# Patient Record
Sex: Female | Born: 1955
Health system: Southern US, Community
[De-identification: ages and names within clinical notes are randomized; demographics above are authoritative.]

## PROBLEM LIST (undated history)

## (undated) DIAGNOSIS — R011 Cardiac murmur, unspecified: Secondary | ICD-10-CM

## (undated) DIAGNOSIS — I10 Essential (primary) hypertension: Secondary | ICD-10-CM

## (undated) DIAGNOSIS — L570 Actinic keratosis: Secondary | ICD-10-CM

## (undated) DIAGNOSIS — T7840XA Allergy, unspecified, initial encounter: Secondary | ICD-10-CM

## (undated) DIAGNOSIS — L309 Dermatitis, unspecified: Secondary | ICD-10-CM

## (undated) DIAGNOSIS — B019 Varicella without complication: Secondary | ICD-10-CM

## (undated) DIAGNOSIS — E785 Hyperlipidemia, unspecified: Secondary | ICD-10-CM

## (undated) DIAGNOSIS — M1990 Unspecified osteoarthritis, unspecified site: Secondary | ICD-10-CM

## (undated) DIAGNOSIS — N1832 Chronic kidney disease, stage 3b: Secondary | ICD-10-CM

## (undated) DIAGNOSIS — K219 Gastro-esophageal reflux disease without esophagitis: Secondary | ICD-10-CM

## (undated) DIAGNOSIS — K635 Polyp of colon: Secondary | ICD-10-CM

## (undated) DIAGNOSIS — N3941 Urge incontinence: Secondary | ICD-10-CM

## (undated) DIAGNOSIS — D509 Iron deficiency anemia, unspecified: Secondary | ICD-10-CM

## (undated) DIAGNOSIS — U071 COVID-19: Secondary | ICD-10-CM

## (undated) DIAGNOSIS — E669 Obesity, unspecified: Secondary | ICD-10-CM

## (undated) HISTORY — PX: BUNIONECTOMY: SHX129

## (undated) HISTORY — PX: TONSILLECTOMY AND ADENOIDECTOMY: SUR1326

## (undated) HISTORY — DX: Allergy, unspecified, initial encounter: T78.40XA

## (undated) HISTORY — DX: Dermatitis, unspecified: L30.9

## (undated) HISTORY — DX: Polyp of colon: K63.5

## (undated) HISTORY — PX: COLONOSCOPY: SHX174

## (undated) HISTORY — DX: COVID-19: U07.1

## (undated) HISTORY — DX: Hyperlipidemia, unspecified: E78.5

## (undated) HISTORY — PX: EYE SURGERY: SHX253

## (undated) HISTORY — DX: Actinic keratosis: L57.0

## (undated) HISTORY — DX: Cardiac murmur, unspecified: R01.1

## (undated) HISTORY — DX: Varicella without complication: B01.9

---

## 2001-10-15 HISTORY — PX: ESOPHAGOGASTRODUODENOSCOPY: SHX1529

## 2004-06-21 ENCOUNTER — Ambulatory Visit: Payer: Self-pay | Admitting: Unknown Physician Specialty

## 2005-07-11 ENCOUNTER — Ambulatory Visit: Payer: Self-pay | Admitting: Unknown Physician Specialty

## 2006-02-20 ENCOUNTER — Ambulatory Visit: Payer: Self-pay | Admitting: Unknown Physician Specialty

## 2006-07-31 ENCOUNTER — Ambulatory Visit: Payer: Self-pay | Admitting: Unknown Physician Specialty

## 2007-08-20 ENCOUNTER — Ambulatory Visit: Payer: Self-pay | Admitting: Unknown Physician Specialty

## 2007-08-27 ENCOUNTER — Ambulatory Visit: Payer: Self-pay | Admitting: Unknown Physician Specialty

## 2008-09-01 ENCOUNTER — Ambulatory Visit: Payer: Self-pay | Admitting: Unknown Physician Specialty

## 2009-09-07 ENCOUNTER — Ambulatory Visit: Payer: Self-pay | Admitting: Unknown Physician Specialty

## 2010-09-13 ENCOUNTER — Ambulatory Visit: Payer: Self-pay | Admitting: Unknown Physician Specialty

## 2011-04-11 ENCOUNTER — Ambulatory Visit: Payer: Self-pay | Admitting: Unknown Physician Specialty

## 2014-04-07 ENCOUNTER — Ambulatory Visit: Payer: Self-pay | Admitting: Unknown Physician Specialty

## 2015-09-13 ENCOUNTER — Other Ambulatory Visit: Payer: Self-pay | Admitting: Otolaryngology

## 2015-09-13 DIAGNOSIS — R430 Anosmia: Secondary | ICD-10-CM

## 2015-09-14 ENCOUNTER — Ambulatory Visit: Payer: Self-pay

## 2015-10-04 ENCOUNTER — Ambulatory Visit: Payer: Self-pay

## 2015-10-05 ENCOUNTER — Ambulatory Visit
Admission: RE | Admit: 2015-10-05 | Discharge: 2015-10-05 | Disposition: A | Discharge status: Home / Self Care | Payer: BLUE CROSS/BLUE SHIELD | Source: Ambulatory Visit | Attending: Otolaryngology | Admitting: Otolaryngology

## 2015-10-05 DIAGNOSIS — J3489 Other specified disorders of nose and nasal sinuses: Secondary | ICD-10-CM | POA: Diagnosis not present

## 2015-10-05 DIAGNOSIS — R439 Unspecified disturbances of smell and taste: Secondary | ICD-10-CM | POA: Diagnosis not present

## 2015-10-05 DIAGNOSIS — R430 Anosmia: Secondary | ICD-10-CM

## 2015-10-05 LAB — POCT I-STAT CREATININE: Creatinine, Ser: 1 mg/dL (ref 0.44–1.00)

## 2015-10-05 MED ORDER — GADOBENATE DIMEGLUMINE 529 MG/ML IV SOLN
15.0000 mL | Freq: Once | INTRAVENOUS | Status: AC | PRN
  Administered 2015-10-05: 15 mL via INTRAVENOUS

## 2016-03-26 ENCOUNTER — Other Ambulatory Visit: Payer: Self-pay | Admitting: Internal Medicine

## 2016-03-26 DIAGNOSIS — Z1231 Encounter for screening mammogram for malignant neoplasm of breast: Secondary | ICD-10-CM

## 2016-04-17 ENCOUNTER — Encounter: Payer: Self-pay | Admitting: *Deleted

## 2016-04-18 ENCOUNTER — Ambulatory Visit
Admission: RE | Admit: 2016-04-18 | Discharge: 2016-04-18 | Disposition: A | Discharge status: Home / Self Care | Payer: BLUE CROSS/BLUE SHIELD | Source: Ambulatory Visit | Attending: Unknown Physician Specialty | Admitting: Unknown Physician Specialty

## 2016-04-18 ENCOUNTER — Encounter
Admission: RE | Disposition: A | Discharge status: Home / Self Care | Payer: Self-pay | Source: Ambulatory Visit | Attending: Unknown Physician Specialty

## 2016-04-18 ENCOUNTER — Ambulatory Visit: Payer: BLUE CROSS/BLUE SHIELD | Admitting: Anesthesiology

## 2016-04-18 ENCOUNTER — Encounter: Payer: Self-pay | Admitting: *Deleted

## 2016-04-18 DIAGNOSIS — Z86010 Personal history of colonic polyps: Secondary | ICD-10-CM | POA: Insufficient documentation

## 2016-04-18 DIAGNOSIS — I10 Essential (primary) hypertension: Secondary | ICD-10-CM | POA: Insufficient documentation

## 2016-04-18 DIAGNOSIS — Z7982 Long term (current) use of aspirin: Secondary | ICD-10-CM | POA: Insufficient documentation

## 2016-04-18 DIAGNOSIS — Z79899 Other long term (current) drug therapy: Secondary | ICD-10-CM | POA: Insufficient documentation

## 2016-04-18 DIAGNOSIS — K5730 Diverticulosis of large intestine without perforation or abscess without bleeding: Secondary | ICD-10-CM | POA: Insufficient documentation

## 2016-04-18 DIAGNOSIS — N3941 Urge incontinence: Secondary | ICD-10-CM | POA: Diagnosis not present

## 2016-04-18 DIAGNOSIS — Z6827 Body mass index (BMI) 27.0-27.9, adult: Secondary | ICD-10-CM | POA: Diagnosis not present

## 2016-04-18 DIAGNOSIS — K640 First degree hemorrhoids: Secondary | ICD-10-CM | POA: Insufficient documentation

## 2016-04-18 DIAGNOSIS — Z79890 Hormone replacement therapy (postmenopausal): Secondary | ICD-10-CM | POA: Insufficient documentation

## 2016-04-18 DIAGNOSIS — E669 Obesity, unspecified: Secondary | ICD-10-CM | POA: Insufficient documentation

## 2016-04-18 DIAGNOSIS — Z1211 Encounter for screening for malignant neoplasm of colon: Secondary | ICD-10-CM | POA: Insufficient documentation

## 2016-04-18 HISTORY — DX: Essential (primary) hypertension: I10

## 2016-04-18 HISTORY — DX: Urge incontinence: N39.41

## 2016-04-18 HISTORY — PX: COLONOSCOPY WITH PROPOFOL: SHX5780

## 2016-04-18 HISTORY — DX: Obesity, unspecified: E66.9

## 2016-04-18 SURGERY — COLONOSCOPY WITH PROPOFOL
Anesthesia: General

## 2016-04-18 MED ORDER — PHENYLEPHRINE HCL 10 MG/ML IJ SOLN
INTRAMUSCULAR | Status: DC | PRN
  Administered 2016-04-18 (×3): 100 ug via INTRAVENOUS

## 2016-04-18 MED ORDER — SODIUM CHLORIDE 0.9 % IV SOLN: INTRAVENOUS | Status: DC

## 2016-04-18 MED ORDER — EPHEDRINE SULFATE 50 MG/ML IJ SOLN
INTRAMUSCULAR | Status: DC | PRN
  Administered 2016-04-18: 10 mg via INTRAVENOUS

## 2016-04-18 MED ORDER — PROPOFOL 10 MG/ML IV BOLUS
INTRAVENOUS | Status: DC | PRN
  Administered 2016-04-18: 100 mg via INTRAVENOUS

## 2016-04-18 MED ORDER — LIDOCAINE 2% (20 MG/ML) 5 ML SYRINGE
INTRAMUSCULAR | Status: DC | PRN
  Administered 2016-04-18: 40 mg via INTRAVENOUS

## 2016-04-18 MED ORDER — SODIUM CHLORIDE 0.9 % IV SOLN
INTRAVENOUS | Status: DC
  Administered 2016-04-18: 14:00:00 via INTRAVENOUS

## 2016-04-18 MED ORDER — MIDAZOLAM HCL 5 MG/5ML IJ SOLN
INTRAMUSCULAR | Status: DC | PRN
  Administered 2016-04-18: 1 mg via INTRAVENOUS

## 2016-04-18 MED ORDER — GLYCOPYRROLATE 0.2 MG/ML IJ SOLN
INTRAMUSCULAR | Status: DC | PRN
  Administered 2016-04-18: 0.2 mg via INTRAVENOUS

## 2016-04-18 MED ORDER — PROPOFOL 500 MG/50ML IV EMUL
INTRAVENOUS | Status: DC | PRN
  Administered 2016-04-18: 160 ug/kg/min via INTRAVENOUS

## 2016-04-18 MED ORDER — FENTANYL CITRATE (PF) 100 MCG/2ML IJ SOLN
INTRAMUSCULAR | Status: DC | PRN
  Administered 2016-04-18: 50 ug via INTRAVENOUS

## 2016-04-18 NOTE — Anesthesia Preprocedure Evaluation (Signed)
Anesthesia Evaluation  Patient identified by MRN, date of birth, ID band Patient awake    Reviewed: Allergy & Precautions, NPO status , Patient's Chart, lab work & pertinent test results  History of Anesthesia Complications Negative for: history of anesthetic complications  Airway Mallampati: II  TM Distance: >3 FB Neck ROM: Full    Dental no notable dental hx.    Pulmonary neg pulmonary ROS, neg sleep apnea, neg COPD,    breath sounds clear to auscultation- rhonchi (-) wheezing      Cardiovascular Exercise Tolerance: Good hypertension, (-) CAD and (-) Past MI  Rhythm:Regular Rate:Normal - Systolic murmurs and - Diastolic murmurs    Neuro/Psych negative neurological ROS  negative psych ROS   GI/Hepatic negative GI ROS, Neg liver ROS,   Endo/Other  negative endocrine ROSneg diabetes  Renal/GU negative Renal ROS     Musculoskeletal negative musculoskeletal ROS (+)   Abdominal (+) - obese,   Peds  Hematology negative hematology ROS (+)   Anesthesia Other Findings Past Medical History: No date: Hypertension No date: Urgency incontinence   Reproductive/Obstetrics                             Anesthesia Physical Anesthesia Plan  ASA: II  Anesthesia Plan: General   Post-op Pain Management:    Induction: Intravenous  Airway Management Planned: Natural Airway  Additional Equipment:   Intra-op Plan:   Post-operative Plan:   Informed Consent: I have reviewed the patients History and Physical, chart, labs and discussed the procedure including the risks, benefits and alternatives for the proposed anesthesia with the patient or authorized representative who has indicated his/her understanding and acceptance.   Dental advisory given  Plan Discussed with: CRNA and Anesthesiologist  Anesthesia Plan Comments:         Anesthesia Quick Evaluation

## 2016-04-18 NOTE — Op Note (Signed)
[facility] Gastroenterology Patient Name: Melissa [name] Procedure Date: 04/18/2016 2:03 PM MRN: [id number] Account #: [id number] Date of Birth: 1955-06-08 Admit Type: Outpatient Age: 60 Room: [facility] ENDO ROOM 4 Gender: Female Note Status: Finalized Procedure:            Colonoscopy Indications:          High risk colon cancer surveillance: Personal history                        of colonic polyps Providers:            Manya Silvas, MD Referring MD:         Mikeal Hawthorne. Brynda Greathouse MD, MD (Referring MD) Medicines:            Propofol per Anesthesia Complications:        No immediate complications. Procedure:            Pre-Anesthesia Assessment:                       - After reviewing the risks and benefits, the patient                        was deemed in satisfactory condition to undergo the                        procedure.                       After obtaining informed consent, the colonoscope was                        passed under direct vision. Throughout the procedure,                        the patient's blood pressure, pulse, and oxygen                        saturations were monitored continuously. The                        Colonoscope was introduced through the anus and                        advanced to the the cecum, identified by appendiceal                        orifice and ileocecal valve. The colonoscopy was                        performed without difficulty. The patient tolerated the                        procedure well. The quality of the bowel preparation                        was excellent. Findings:      A few small-mouthed diverticula were found in the sigmoid colon.      Internal hemorrhoids were found during endoscopy. The hemorrhoids were       small and Grade I (internal hemorrhoids that do not prolapse).      The exam  was otherwise without abnormality. Impression:           - Diverticulosis in the sigmoid colon.   - Internal hemorrhoids.                       - The examination was otherwise normal.                       - No specimens collected. Recommendation:       - Repeat colonoscopy in 5 years for screening purposes. Manya Silvas, MD 04/18/2016 2:40:08 PM This report has been signed electronically. Number of Addenda: 0 Note Initiated On: 04/18/2016 2:03 PM Scope Withdrawal Time: 0 hours 9 minutes 0 seconds  Total Procedure Duration: 0 hours 28 minutes 19 seconds       [facility]

## 2016-04-18 NOTE — H&P (Signed)
   Primary Care Physician:  Marden Noble, MD Primary Gastroenterologist:  Dr. Vira Agar  Pre-Procedure History & Physical: HPI:  Melissa [name] is a 60 y.o. female is here for an colonoscopy.   Past Medical History:  Diagnosis Date  . Hypertension   . Obesity   . Urgency incontinence     Past Surgical History:  Procedure Laterality Date  . BUNIONECTOMY Bilateral   . COLONOSCOPY      Prior to Admission medications   Medication Sig Start Date End Date Taking? Authorizing Provider  aspirin 325 MG tablet Take 325 mg by mouth every 6 (six) hours as needed.   Yes Historical Provider, MD  aspirin EC 81 MG tablet Take 81 mg by mouth daily.   Yes Historical Provider, MD  Calcium Carbonate (OS-CAL PO) Take by mouth 1 day or 1 dose.   Yes Historical Provider, MD  estrogen, conjugated,-medroxyprogesterone (PREMPRO) 0.45-1.5 MG tablet Take 1 tablet by mouth daily.   Yes Historical Provider, MD  oxybutynin (DITROPAN-XL) 10 MG 24 hr tablet Take 10 mg by mouth at bedtime.   Yes Historical Provider, MD  ramipril (ALTACE) 5 MG capsule Take 5 mg by mouth daily.   Yes Historical Provider, MD    Allergies as of 04/15/2016  . (Not on File)    History reviewed. No pertinent family history.  Social History   Social History  . Marital status: Married    Spouse name: N/A  . Number of children: N/A  . Years of education: N/A   Occupational History  . Not on file.   Social History Main Topics  . Smoking status: Never Smoker  . Smokeless tobacco: Never Used  . Alcohol use Not on file  . Drug use: Unknown  . Sexual activity: Not on file   Other Topics Concern  . Not on file   Social History Narrative  . No narrative on file    Review of Systems: See HPI, otherwise negative ROS  Physical Exam: BP 134/77   Pulse 61   Temp 97.9 F (36.6 C) (Tympanic)   Resp 12   Ht 5\' 5"  (1.651 m)   Wt 73.5 kg (162 lb)   SpO2 100%   BMI 26.96 kg/m  General:   Alert,  pleasant and  cooperative in NAD Head:  Normocephalic and atraumatic. Neck:  Supple; no masses or thyromegaly. Lungs:  Clear throughout to auscultation.    Heart:  Regular rate and rhythm. Abdomen:  Soft, nontender and nondistended. Normal bowel sounds, without guarding, and without rebound.   Neurologic:  Alert and  oriented x4;  grossly normal neurologically.  Impression/Plan: Melissa [name] is here for an colonoscopy to be performed for personal history of colon polyps  Risks, benefits, limitations, and alternatives regarding  colonoscopy have been reviewed with the patient.  Questions have been answered.  All parties agreeable.   Gaylyn Cheers, MD  04/18/2016, 1:57 PM

## 2016-04-18 NOTE — Anesthesia Postprocedure Evaluation (Signed)
Anesthesia Post Note  Patient: Melissa [name]  Procedure(s) Performed: Procedure(s) (LRB): COLONOSCOPY WITH PROPOFOL (N/A)  Patient location during evaluation: Endoscopy Anesthesia Type: General Level of consciousness: awake and alert and oriented Pain management: pain level controlled Vital Signs Assessment: post-procedure vital signs reviewed and stable Respiratory status: spontaneous breathing, nonlabored ventilation and respiratory function stable Cardiovascular status: blood pressure returned to baseline and stable Postop Assessment: no signs of nausea or vomiting Anesthetic complications: no    Last Vitals:  Vitals:   04/18/16 1445 04/18/16 1455  BP:  (!) 112/94  Pulse: 78 82  Resp: 20 18  Temp:      Last Pain:  Vitals:   04/18/16 1317  TempSrc: Tympanic                 Janit Cutter

## 2016-04-18 NOTE — Transfer of Care (Signed)
Immediate Anesthesia Transfer of Care Note  Patient: Melissa [name]  Procedure(s) Performed: Procedure(s): COLONOSCOPY WITH PROPOFOL (N/A)  Patient Location: PACU and Endoscopy Unit  Anesthesia Type:General  Level of Consciousness: awake and patient cooperative  Airway & Oxygen Therapy: Patient Spontanous Breathing and Patient connected to nasal cannula oxygen  Post-op Assessment: Report given to RN and Post -op Vital signs reviewed and stable  Post vital signs: Reviewed and stable  Last Vitals:  Vitals:   04/18/16 1317  BP: 134/77  Pulse: 61  Resp: 12  Temp: 36.6 C    Last Pain:  Vitals:   04/18/16 1317  TempSrc: Tympanic         Complications: No apparent anesthesia complications and Patient re-intubated

## 2016-04-19 ENCOUNTER — Encounter: Payer: Self-pay | Admitting: Unknown Physician Specialty

## 2016-05-09 ENCOUNTER — Ambulatory Visit
Admission: RE | Admit: 2016-05-09 | Discharge: 2016-05-09 | Disposition: A | Discharge status: Home / Self Care | Payer: BLUE CROSS/BLUE SHIELD | Source: Ambulatory Visit | Attending: Internal Medicine | Admitting: Internal Medicine

## 2016-05-09 DIAGNOSIS — Z1231 Encounter for screening mammogram for malignant neoplasm of breast: Secondary | ICD-10-CM

## 2017-03-11 ENCOUNTER — Telehealth: Payer: Self-pay | Admitting: Internal Medicine

## 2017-03-11 NOTE — Telephone Encounter (Signed)
Copied from [place] [id number]. Topic: Inquiry >> Mar 11, 2017  1:18 PM Melissa [name], [place] wrote: Reason for CRM: Melissa [name] would like to become a new pt. Of DR. Scott needs a pcp so she can have her yearly physical done  Melissa [name] can be reached at [phone] she said you can leave message on her vm

## 2017-03-11 NOTE — Telephone Encounter (Signed)
No new patients for Dr. Nicki Reaper. She can schedule with Dr. Aundra Dubin.

## 2017-04-03 ENCOUNTER — Encounter: Payer: Self-pay | Admitting: Internal Medicine

## 2017-04-03 ENCOUNTER — Telehealth: Payer: Self-pay | Admitting: *Deleted

## 2017-04-03 ENCOUNTER — Ambulatory Visit (INDEPENDENT_AMBULATORY_CARE_PROVIDER_SITE_OTHER): Payer: BLUE CROSS/BLUE SHIELD | Admitting: Internal Medicine

## 2017-04-03 ENCOUNTER — Other Ambulatory Visit (HOSPITAL_COMMUNITY)
Admission: RE | Admit: 2017-04-03 | Discharge: 2017-04-03 | Disposition: A | Discharge status: Home / Self Care | Payer: BLUE CROSS/BLUE SHIELD | Source: Ambulatory Visit | Attending: Internal Medicine | Admitting: Internal Medicine

## 2017-04-03 VITALS — BP 180/92 | HR 78 | Temp 97.8°F | Ht 65.0 in | Wt 169.0 lb

## 2017-04-03 DIAGNOSIS — Z0000 Encounter for general adult medical examination without abnormal findings: Secondary | ICD-10-CM

## 2017-04-03 DIAGNOSIS — Z13818 Encounter for screening for other digestive system disorders: Secondary | ICD-10-CM

## 2017-04-03 DIAGNOSIS — Z124 Encounter for screening for malignant neoplasm of cervix: Secondary | ICD-10-CM | POA: Insufficient documentation

## 2017-04-03 DIAGNOSIS — I10 Essential (primary) hypertension: Secondary | ICD-10-CM | POA: Diagnosis not present

## 2017-04-03 DIAGNOSIS — N3281 Overactive bladder: Secondary | ICD-10-CM | POA: Insufficient documentation

## 2017-04-03 DIAGNOSIS — N952 Postmenopausal atrophic vaginitis: Secondary | ICD-10-CM | POA: Insufficient documentation

## 2017-04-03 DIAGNOSIS — Z1329 Encounter for screening for other suspected endocrine disorder: Secondary | ICD-10-CM

## 2017-04-03 DIAGNOSIS — Z1231 Encounter for screening mammogram for malignant neoplasm of breast: Secondary | ICD-10-CM | POA: Diagnosis not present

## 2017-04-03 LAB — COMPREHENSIVE METABOLIC PANEL
ALT: 15 U/L (ref 0–35)
AST: 21 U/L (ref 0–37)
Albumin: 4.3 g/dL (ref 3.5–5.2)
Alkaline Phosphatase: 23 U/L — ABNORMAL LOW (ref 39–117)
BUN: 19 mg/dL (ref 6–23)
CO2: 31 mEq/L (ref 19–32)
Calcium: 10.1 mg/dL (ref 8.4–10.5)
Chloride: 105 mEq/L (ref 96–112)
Creatinine, Ser: 0.94 mg/dL (ref 0.40–1.20)
GFR: 63.97 mL/min (ref >60.00)
Glucose, Bld: 89 mg/dL (ref 70–99)
Potassium: 4 mEq/L (ref 3.5–5.1)
Sodium: 141 mEq/L (ref 135–145)
Total Bilirubin: 0.5 mg/dL (ref 0.2–1.2)
Total Protein: 7 g/dL (ref 6.0–8.3)

## 2017-04-03 LAB — URINALYSIS, ROUTINE W REFLEX MICROSCOPIC
Bilirubin Urine: NEGATIVE
Hgb urine dipstick: NEGATIVE
Ketones, ur: NEGATIVE
Leukocytes, UA: NEGATIVE
Nitrite: NEGATIVE
RBC / HPF: NONE SEEN (ref 0–?)
Specific Gravity, Urine: 1.015 (ref 1.000–1.030)
Total Protein, Urine: NEGATIVE
Urine Glucose: NEGATIVE
Urobilinogen, UA: 0.2 (ref 0.0–1.0)
WBC, UA: NONE SEEN (ref 0–?)
pH: 7 (ref 5.0–8.0)

## 2017-04-03 LAB — LIPID PANEL
Cholesterol: 215 mg/dL — ABNORMAL HIGH (ref 0–200)
HDL: 82.4 mg/dL (ref >39.00)
LDL Cholesterol: 124 mg/dL — ABNORMAL HIGH (ref 0–99)
NonHDL: 132.92
Total CHOL/HDL Ratio: 3
Triglycerides: 43 mg/dL (ref 0.0–149.0)
VLDL: 8.6 mg/dL (ref 0.0–40.0)

## 2017-04-03 LAB — CBC
HCT: 36.7 % (ref 36.0–46.0)
Hemoglobin: 12 g/dL (ref 12.0–15.0)
MCHC: 32.8 g/dL (ref 30.0–36.0)
MCV: 93.4 fl (ref 78.0–100.0)
Platelets: 190 10*3/uL (ref 150.0–400.0)
RBC: 3.93 Mil/uL (ref 3.87–5.11)
RDW: 13.6 % (ref 11.5–15.5)
WBC: 4.9 10*3/uL (ref 4.0–10.5)

## 2017-04-03 LAB — TSH: TSH: 1.01 u[IU]/mL (ref 0.35–4.50)

## 2017-04-03 LAB — T4, FREE: Free T4: 0.82 ng/dL (ref 0.60–1.60)

## 2017-04-03 MED ORDER — OXYBUTYNIN CHLORIDE ER 10 MG PO TB24: 10.0000 mg | ORAL_TABLET | Freq: Every day | ORAL | 3 refills | Status: DC

## 2017-04-03 NOTE — Progress Notes (Addendum)
Chief Complaint  Patient presents with  . Establish Care   Establish care and Annual exam  1. HTN BP uncontrolled today 160/92 repeat 180/92 reports taking ramipril 5 mg qd and normally BP controlled 120/80s. She thinks she is nervous about pap 2. Wants refills on medications     Review of Systems  Constitutional: Negative for weight loss.  HENT:       +hoarse voice pt thinks 2/2 seasonal allergies Post nasal drip   Respiratory: Negative for shortness of breath.   Cardiovascular: Positive for palpitations. Negative for chest pain.       +palpitations with caffeine   Gastrointestinal: Positive for constipation. Negative for abdominal pain and blood in stool.       Taking stool softners and Sennakot helping   Genitourinary:       +overactive bladder and leakage of urine  +vaginal dryness   Musculoskeletal: Positive for joint pain.       +right elbow pain new taking Advil 2 per day recently thinks exercise induced   Skin: Negative for rash.  Endo/Heme/Allergies: Positive for environmental allergies.  Psychiatric/Behavioral: Negative for depression.   Past Medical History:  Diagnosis Date  . Allergy   . Chicken pox   . Colon polyps   . Heart murmur    youth  . Hypertension   . Obesity   . Urgency incontinence    Past Surgical History:  Procedure Laterality Date  . BUNIONECTOMY Bilateral   . COLONOSCOPY    . COLONOSCOPY WITH PROPOFOL N/A 04/18/2016   Procedure: COLONOSCOPY WITH PROPOFOL;  Surgeon: Manya Silvas, MD;  Location: [facility] ENDOSCOPY;  Service: Endoscopy;  Laterality: N/A;  . TONSILLECTOMY AND ADENOIDECTOMY     No family history on file. Social History   Socioeconomic History  . Marital status: Married    Spouse name: Not on file  . Number of children: Not on file  . Years of education: Not on file  . Highest education level: Not on file  Social Needs  . Financial resource strain: Not on file  . Food insecurity - worry: Not on file  . Food insecurity -  inability: Not on file  . Transportation needs - medical: Not on file  . Transportation needs - non-medical: Not on file  Occupational History  . Not on file  Tobacco Use  . Smoking status: Never Smoker  . Smokeless tobacco: Never Used  Substance and Sexual Activity  . Alcohol use: Not on file    Comment: occasionally wine   . Drug use: No  . Sexual activity: No  Other Topics Concern  . Not on file  Social History Narrative   Married    1 daughter age 79 as of 03/2017    Current Meds  Medication Sig  . aspirin EC 81 MG tablet Take 81 mg by mouth daily.  . Calcium-Magnesium-Vitamin D (CALCIUM 1200+D3 PO) Take by mouth 2 (two) times daily.  . Multiple Vitamin (MULTI-VITAMINS) TABS Take by mouth.  . oxybutynin (DITROPAN-XL) 10 MG 24 hr tablet Take 1 tablet (10 mg total) by mouth at bedtime.  . ramipril (ALTACE) 5 MG capsule Take 5 mg by mouth daily.  . [DISCONTINUED] Calcium Carbonate (OS-CAL PO) Take by mouth 1 day or 1 dose.  . [DISCONTINUED] oxybutynin (DITROPAN-XL) 10 MG 24 hr tablet Take 10 mg by mouth at bedtime.   Allergies  Allergen Reactions  . Terfenadine Rash   No results found for this or any previous visit (from the past  2160 hour(s)). Objective  Body mass index is 28.12 kg/m. Wt Readings from Last 3 Encounters:  04/03/17 169 lb (76.7 kg)  04/18/16 162 lb (73.5 kg)   Temp Readings from Last 3 Encounters:  04/03/17 97.8 F (36.6 C) (Oral)  04/18/16 97.2 F (36.2 C)   BP Readings from Last 3 Encounters:  04/03/17 (!) 180/92  04/18/16 128/80   Pulse Readings from Last 3 Encounters:  04/03/17 78  04/18/16 70   Pulse oximetry on room air is 99% Physical Exam  Constitutional: She is oriented to person, place, and time and well-developed, well-nourished, and in no distress.  HENT:  Head: Normocephalic and atraumatic.  Mouth/Throat: Oropharynx is clear and moist and mucous membranes are normal.  Eyes: Conjunctivae are normal. Pupils are equal, round, and  reactive to light.  Cardiovascular: Normal rate, regular rhythm and normal heart sounds.  No murmur heard. Neg leg edema b/l   Pulmonary/Chest: Effort normal and breath sounds normal.  Abdominal: Soft. Bowel sounds are normal.  Genitourinary: Rectum normal, vagina normal, uterus normal, cervix normal, right adnexa normal, left adnexa normal and vulva normal. Cervix exhibits no motion tenderness.  Neurological: She is alert and oriented to person, place, and time.  Skin: Skin is warm and dry.  Psychiatric: Mood, memory, affect and judgment normal.  Nursing note and vitals reviewed.  Assessment   1. HTN uncontrolled w/o sx's  2. Vaginal atrophy  3. Overactive bladder  4. Hm  Plan  1.  Increase Ramipril to 10 mg  Pt wants to hold on re-ordering 10 mg and will take 2-5 mg tablets until f/u in 1 month  2.  Disc replens OTC gel  3.  Rx refill  4.  Flu had 03/19/17  Tdap will get back to me on year  Disc shingrix vaccine  Never smoker   Colonoscopy 04/18/16 IH, diverticulosis f/u in 5 years   Pap today   F/u Dr. Loney Laurence skin yearly appt next week h/o Aks  Labs today CMET, CBC, UA, TSH, FT4, lipid, Hep C, hep B titer pt already had and immune      mammo neg 05/09/16 referred today  Provider: Dr. Olivia Mackie McLean-Scocuzza

## 2017-04-03 NOTE — Patient Instructions (Signed)
Follow up in 1 month to recheck blood pressure  Labs today  Increase Ramipril to 10 mg daily  Try Replens for vaginal dryness  We will do mammo 05/09/17  Think about shingrix   Hypertension Hypertension, commonly called high blood pressure, is when the force of blood pumping through the arteries is too strong. The arteries are the blood vessels that carry blood from the heart throughout the body. Hypertension forces the heart to work harder to pump blood and may cause arteries to become narrow or stiff. Having untreated or uncontrolled hypertension can cause heart attacks, strokes, kidney disease, and other problems. A blood pressure reading consists of a higher number over a lower number. Ideally, your blood pressure should be below 120/80. The first ("top") number is called the systolic pressure. It is a measure of the pressure in your arteries as your heart beats. The second ("bottom") number is called the diastolic pressure. It is a measure of the pressure in your arteries as the heart relaxes. What are the causes? The cause of this condition is not known. What increases the risk? Some risk factors for high blood pressure are under your control. Others are not. Factors you can change  Smoking.  Having type 2 diabetes mellitus, high cholesterol, or both.  Not getting enough exercise or physical activity.  Being overweight.  Having too much fat, sugar, calories, or salt (sodium) in your diet.  Drinking too much alcohol. Factors that are difficult or impossible to change  Having chronic kidney disease.  Having a family history of high blood pressure.  Age. Risk increases with age.  Race. You may be at higher risk if you are African-American.  Gender. Men are at higher risk than women before age 48. After age 71, women are at higher risk than men.  Having obstructive sleep apnea.  Stress. What are the signs or symptoms? Extremely high blood pressure (hypertensive crisis) may  cause:  Headache.  Anxiety.  Shortness of breath.  Nosebleed.  Nausea and vomiting.  Severe chest pain.  Jerky movements you cannot control (seizures).  How is this diagnosed? This condition is diagnosed by measuring your blood pressure while you are seated, with your arm resting on a surface. The cuff of the blood pressure monitor will be placed directly against the skin of your upper arm at the level of your heart. It should be measured at least twice using the same arm. Certain conditions can cause a difference in blood pressure between your right and left arms. Certain factors can cause blood pressure readings to be lower or higher than normal (elevated) for a short period of time:  When your blood pressure is higher when you are in a health care provider's office than when you are at home, this is called white coat hypertension. Most people with this condition do not need medicines.  When your blood pressure is higher at home than when you are in a health care provider's office, this is called masked hypertension. Most people with this condition may need medicines to control blood pressure.  If you have a high blood pressure reading during one visit or you have normal blood pressure with other risk factors:  You may be asked to return on a different day to have your blood pressure checked again.  You may be asked to monitor your blood pressure at home for 1 week or longer.  If you are diagnosed with hypertension, you may have other blood or imaging tests to help  your health care provider understand your overall risk for other conditions. How is this treated? This condition is treated by making healthy lifestyle changes, such as eating healthy foods, exercising more, and reducing your alcohol intake. Your health care provider may prescribe medicine if lifestyle changes are not enough to get your blood pressure under control, and if:  Your systolic blood pressure is above  130.  Your diastolic blood pressure is above 80.  Your personal target blood pressure may vary depending on your medical conditions, your age, and other factors. Follow these instructions at home: Eating and drinking  Eat a diet that is high in fiber and potassium, and low in sodium, added sugar, and fat. An example eating plan is called the DASH (Dietary Approaches to Stop Hypertension) diet. To eat this way: ? Eat plenty of fresh fruits and vegetables. Try to fill half of your plate at each meal with fruits and vegetables. ? Eat whole grains, such as whole wheat pasta, brown rice, or whole grain bread. Fill about one quarter of your plate with whole grains. ? Eat or drink low-fat dairy products, such as skim milk or low-fat yogurt. ? Avoid fatty cuts of meat, processed or cured meats, and poultry with skin. Fill about one quarter of your plate with lean proteins, such as fish, chicken without skin, beans, eggs, and tofu. ? Avoid premade and processed foods. These tend to be higher in sodium, added sugar, and fat.  Reduce your daily sodium intake. Most people with hypertension should eat less than 1,500 mg of sodium a day.  Limit alcohol intake to no more than 1 drink a day for nonpregnant women and 2 drinks a day for men. One drink equals 12 oz of beer, 5 oz of wine, or 1 oz of hard liquor. Lifestyle  Work with your health care provider to maintain a healthy body weight or to lose weight. Ask what an ideal weight is for you.  Get at least 30 minutes of exercise that causes your heart to beat faster (aerobic exercise) most days of the week. Activities may include walking, swimming, or biking.  Include exercise to strengthen your muscles (resistance exercise), such as pilates or lifting weights, as part of your weekly exercise routine. Try to do these types of exercises for 30 minutes at least 3 days a week.  Do not use any products that contain nicotine or tobacco, such as cigarettes and  e-cigarettes. If you need help quitting, ask your health care provider.  Monitor your blood pressure at home as told by your health care provider.  Keep all follow-up visits as told by your health care provider. This is important. Medicines  Take over-the-counter and prescription medicines only as told by your health care provider. Follow directions carefully. Blood pressure medicines must be taken as prescribed.  Do not skip doses of blood pressure medicine. Doing this puts you at risk for problems and can make the medicine less effective.  Ask your health care provider about side effects or reactions to medicines that you should watch for. Contact a health care provider if:  You think you are having a reaction to a medicine you are taking.  You have headaches that keep coming back (recurring).  You feel dizzy.  You have swelling in your ankles.  You have trouble with your vision. Get help right away if:  You develop a severe headache or confusion.  You have unusual weakness or numbness.  You feel faint.  You have  severe pain in your chest or abdomen.  You vomit repeatedly.  You have trouble breathing. Summary  Hypertension is when the force of blood pumping through your arteries is too strong. If this condition is not controlled, it may put you at risk for serious complications.  Your personal target blood pressure may vary depending on your medical conditions, your age, and other factors. For most people, a normal blood pressure is less than 120/80.  Hypertension is treated with lifestyle changes, medicines, or a combination of both. Lifestyle changes include weight loss, eating a healthy, low-sodium diet, exercising more, and limiting alcohol. This information is not intended to replace advice given to you by your health care provider. Make sure you discuss any questions you have with your health care provider. Document Released: 04/21/2005 Document Revised: 03/19/2016  Document Reviewed: 03/19/2016 Elsevier Interactive Patient Education  [employer].

## 2017-04-03 NOTE — Addendum Note (Signed)
Addended by: Orland Mustard on: 04/03/2017 01:06 PM   Modules accepted: Level of Service

## 2017-04-03 NOTE — Telephone Encounter (Signed)
Copied from [place]. Topic: General - Other >> Apr 03, 2017 12:43 PM Lennox Solders wrote: Reason for CRM: Tamika from [facility] [phone] is calling they would like to know if md   would like HPV test on pap smear

## 2017-04-03 NOTE — Telephone Encounter (Signed)
Yes please HPV with reflex   Thanks tMS

## 2017-04-03 NOTE — Telephone Encounter (Signed)
Test added on and cytology aware.

## 2017-04-04 LAB — HEPATITIS C ANTIBODY
Hepatitis C Ab: NONREACTIVE
SIGNAL TO CUT-OFF: 0.01 (ref <1.00)

## 2017-04-06 ENCOUNTER — Telehealth: Payer: Self-pay | Admitting: Internal Medicine

## 2017-04-06 ENCOUNTER — Other Ambulatory Visit: Payer: Self-pay | Admitting: Internal Medicine

## 2017-04-06 DIAGNOSIS — I10 Essential (primary) hypertension: Secondary | ICD-10-CM

## 2017-04-06 MED ORDER — RAMIPRIL 10 MG PO CAPS: 10.0000 mg | ORAL_CAPSULE | Freq: Every day | ORAL | 0 refills | Status: DC

## 2017-04-06 NOTE — Telephone Encounter (Signed)
Please advise 

## 2017-04-06 NOTE — Telephone Encounter (Signed)
Sent to [facility] [place]? She wanted it sent there and not [facility]?  Thanks [employer]

## 2017-04-06 NOTE — Telephone Encounter (Signed)
Pt. calling and requesting that Dr. Terese Door to order the increased dose of Ramipril, as was discussed at the appt. on 11/30.  She would like her [facility] [place], and notify her of the refill being taken care of.  Advised will forward message to Dr. Claris Gladden office.

## 2017-04-07 LAB — CYTOLOGY - PAP
Diagnosis: NEGATIVE
HPV: NOT DETECTED

## 2017-04-07 NOTE — Telephone Encounter (Signed)
She requested that it be sent to [facility] and you sent it to the correct pharmacy.

## 2017-04-09 ENCOUNTER — Telehealth: Payer: Self-pay | Admitting: Internal Medicine

## 2017-04-09 DIAGNOSIS — Z1231 Encounter for screening mammogram for malignant neoplasm of breast: Secondary | ICD-10-CM

## 2017-04-09 NOTE — Telephone Encounter (Signed)
Need new order to read 3D TOMO IMG [id number]. Please and thank you!

## 2017-04-09 NOTE — Telephone Encounter (Signed)
Corrected order

## 2017-04-16 ENCOUNTER — Encounter: Payer: Self-pay | Admitting: Internal Medicine

## 2017-05-01 ENCOUNTER — Encounter: Payer: Self-pay | Admitting: Internal Medicine

## 2017-05-01 ENCOUNTER — Ambulatory Visit (INDEPENDENT_AMBULATORY_CARE_PROVIDER_SITE_OTHER): Payer: BLUE CROSS/BLUE SHIELD | Admitting: Internal Medicine

## 2017-05-01 VITALS — BP 138/90 | HR 61 | Temp 97.3°F | Resp 16 | Ht 65.0 in | Wt 170.1 lb

## 2017-05-01 DIAGNOSIS — I10 Essential (primary) hypertension: Secondary | ICD-10-CM

## 2017-05-01 DIAGNOSIS — E785 Hyperlipidemia, unspecified: Secondary | ICD-10-CM | POA: Insufficient documentation

## 2017-05-01 MED ORDER — AMLODIPINE BESYLATE 2.5 MG PO TABS: 2.5000 mg | ORAL_TABLET | Freq: Every day | ORAL | 1 refills | Status: DC

## 2017-05-01 NOTE — Patient Instructions (Addendum)
Please try norvasc 2.5 mg in am  Please purchase new meter  Try Healthy diet options and continued exercise  Consider shingrix vaccine  Happy New year!  F/u in 1 month   Amlodipine tablets What is this medicine? AMLODIPINE (am LOE di peen) is a calcium-channel blocker. It affects the amount of calcium found in your heart and muscle cells. This relaxes your blood vessels, which can reduce the amount of work the heart has to do. This medicine is used to lower high blood pressure. It is also used to prevent chest pain. This medicine may be used for other purposes; ask your health care provider or pharmacist if you have questions. COMMON BRAND NAME(S): Norvasc What should I tell my health care provider before I take this medicine? They need to know if you have any of these conditions: -heart problems like heart failure or aortic stenosis -liver disease -an unusual or allergic reaction to amlodipine, other medicines, foods, dyes, or preservatives -pregnant or trying to get pregnant -breast-feeding How should I use this medicine? Take this medicine by mouth with a glass of water. Follow the directions on the prescription label. Take your medicine at regular intervals. Do not take more medicine than directed. Talk to your pediatrician regarding the use of this medicine in children. Special care may be needed. This medicine has been used in children as young as 6. Persons over 43 years old may have a stronger reaction to this medicine and need smaller doses. Overdosage: If you think you have taken too much of this medicine contact a poison control center or emergency room at once. NOTE: This medicine is only for you. Do not share this medicine with others. What if I miss a dose? If you miss a dose, take it as soon as you can. If it is almost time for your next dose, take only that dose. Do not take double or extra doses. What may interact with this medicine? -herbal or dietary supplements -local  or general anesthetics -medicines for high blood pressure -medicines for prostate problems -rifampin This list may not describe all possible interactions. Give your health care provider a list of all the medicines, herbs, non-prescription drugs, or dietary supplements you use. Also tell them if you smoke, drink alcohol, or use illegal drugs. Some items may interact with your medicine. What should I watch for while using this medicine? Visit your doctor or health care professional for regular check ups. Check your blood pressure and pulse rate regularly. Ask your health care professional what your blood pressure and pulse rate should be, and when you should contact him or her. This medicine may make you feel confused, dizzy or lightheaded. Do not drive, use machinery, or do anything that needs mental alertness until you know how this medicine affects you. To reduce the risk of dizzy or fainting spells, do not sit or stand up quickly, especially if you are an older patient. Avoid alcoholic drinks; they can make you more dizzy. Do not suddenly stop taking amlodipine. Ask your doctor or health care professional how you can gradually reduce the dose. What side effects may I notice from receiving this medicine? Side effects that you should report to your doctor or health care professional as soon as possible: -allergic reactions like skin rash, itching or hives, swelling of the face, lips, or tongue -breathing problems -changes in vision or hearing -chest pain -fast, irregular heartbeat -swelling of legs or ankles Side effects that usually do not require medical attention (  report to your doctor or health care professional if they continue or are bothersome): -dry mouth -facial flushing -nausea, vomiting -stomach gas, pain -tired, weak -trouble sleeping This list may not describe all possible side effects. Call your doctor for medical advice about side effects. You may report side effects to FDA at  1-800-FDA-1088. Where should I keep my medicine? Keep out of the reach of children. Store at room temperature between 59 and 86 degrees F (15 and 30 degrees C). Protect from light. Keep container tightly closed. Throw away any unused medicine after the expiration date. NOTE: This sheet is a summary. It may not cover all possible information. If you have questions about this medicine, talk to your doctor, pharmacist, or health care provider.  2018 Elsevier/Gold Standard (2012-03-19 11:40:58)   Hypertension Hypertension, commonly called high blood pressure, is when the force of blood pumping through the arteries is too strong. The arteries are the blood vessels that carry blood from the heart throughout the body. Hypertension forces the heart to work harder to pump blood and may cause arteries to become narrow or stiff. Having untreated or uncontrolled hypertension can cause heart attacks, strokes, kidney disease, and other problems. A blood pressure reading consists of a higher number over a lower number. Ideally, your blood pressure should be below 120/80. The first ("top") number is called the systolic pressure. It is a measure of the pressure in your arteries as your heart beats. The second ("bottom") number is called the diastolic pressure. It is a measure of the pressure in your arteries as the heart relaxes. What are the causes? The cause of this condition is not known. What increases the risk? Some risk factors for high blood pressure are under your control. Others are not. Factors you can change  Smoking.  Having type 2 diabetes mellitus, high cholesterol, or both.  Not getting enough exercise or physical activity.  Being overweight.  Having too much fat, sugar, calories, or salt (sodium) in your diet.  Drinking too much alcohol. Factors that are difficult or impossible to change  Having chronic kidney disease.  Having a family history of high blood pressure.  Age. Risk  increases with age.  Race. You may be at higher risk if you are African-American.  Gender. Men are at higher risk than women before age 61. After age 60, women are at higher risk than men.  Having obstructive sleep apnea.  Stress. What are the signs or symptoms? Extremely high blood pressure (hypertensive crisis) may cause:  Headache.  Anxiety.  Shortness of breath.  Nosebleed.  Nausea and vomiting.  Severe chest pain.  Jerky movements you cannot control (seizures).  How is this diagnosed? This condition is diagnosed by measuring your blood pressure while you are seated, with your arm resting on a surface. The cuff of the blood pressure monitor will be placed directly against the skin of your upper arm at the level of your heart. It should be measured at least twice using the same arm. Certain conditions can cause a difference in blood pressure between your right and left arms. Certain factors can cause blood pressure readings to be lower or higher than normal (elevated) for a short period of time:  When your blood pressure is higher when you are in a health care provider's office than when you are at home, this is called white coat hypertension. Most people with this condition do not need medicines.  When your blood pressure is higher at home than when  you are in a health care provider's office, this is called masked hypertension. Most people with this condition may need medicines to control blood pressure.  If you have a high blood pressure reading during one visit or you have normal blood pressure with other risk factors:  You may be asked to return on a different day to have your blood pressure checked again.  You may be asked to monitor your blood pressure at home for 1 week or longer.  If you are diagnosed with hypertension, you may have other blood or imaging tests to help your health care provider understand your overall risk for other conditions. How is this  treated? This condition is treated by making healthy lifestyle changes, such as eating healthy foods, exercising more, and reducing your alcohol intake. Your health care provider may prescribe medicine if lifestyle changes are not enough to get your blood pressure under control, and if:  Your systolic blood pressure is above 130.  Your diastolic blood pressure is above 80.  Your personal target blood pressure may vary depending on your medical conditions, your age, and other factors. Follow these instructions at home: Eating and drinking  Eat a diet that is high in fiber and potassium, and low in sodium, added sugar, and fat. An example eating plan is called the DASH (Dietary Approaches to Stop Hypertension) diet. To eat this way: ? Eat plenty of fresh fruits and vegetables. Try to fill half of your plate at each meal with fruits and vegetables. ? Eat whole grains, such as whole wheat pasta, brown rice, or whole grain bread. Fill about one quarter of your plate with whole grains. ? Eat or drink low-fat dairy products, such as skim milk or low-fat yogurt. ? Avoid fatty cuts of meat, processed or cured meats, and poultry with skin. Fill about one quarter of your plate with lean proteins, such as fish, chicken without skin, beans, eggs, and tofu. ? Avoid premade and processed foods. These tend to be higher in sodium, added sugar, and fat.  Reduce your daily sodium intake. Most people with hypertension should eat less than 1,500 mg of sodium a day.  Limit alcohol intake to no more than 1 drink a day for nonpregnant women and 2 drinks a day for men. One drink equals 12 oz of beer, 5 oz of wine, or 1 oz of hard liquor. Lifestyle  Work with your health care provider to maintain a healthy body weight or to lose weight. Ask what an ideal weight is for you.  Get at least 30 minutes of exercise that causes your heart to beat faster (aerobic exercise) most days of the week. Activities may include  walking, swimming, or biking.  Include exercise to strengthen your muscles (resistance exercise), such as pilates or lifting weights, as part of your weekly exercise routine. Try to do these types of exercises for 30 minutes at least 3 days a week.  Do not use any products that contain nicotine or tobacco, such as cigarettes and e-cigarettes. If you need help quitting, ask your health care provider.  Monitor your blood pressure at home as told by your health care provider.  Keep all follow-up visits as told by your health care provider. This is important. Medicines  Take over-the-counter and prescription medicines only as told by your health care provider. Follow directions carefully. Blood pressure medicines must be taken as prescribed.  Do not skip doses of blood pressure medicine. Doing this puts you at risk for problems  and can make the medicine less effective.  Ask your health care provider about side effects or reactions to medicines that you should watch for. Contact a health care provider if:  You think you are having a reaction to a medicine you are taking.  You have headaches that keep coming back (recurring).  You feel dizzy.  You have swelling in your ankles.  You have trouble with your vision. Get help right away if:  You develop a severe headache or confusion.  You have unusual weakness or numbness.  You feel faint.  You have severe pain in your chest or abdomen.  You vomit repeatedly.  You have trouble breathing. Summary  Hypertension is when the force of blood pumping through your arteries is too strong. If this condition is not controlled, it may put you at risk for serious complications.  Your personal target blood pressure may vary depending on your medical conditions, your age, and other factors. For most people, a normal blood pressure is less than 120/80.  Hypertension is treated with lifestyle changes, medicines, or a combination of both. Lifestyle  changes include weight loss, eating a healthy, low-sodium diet, exercising more, and limiting alcohol. This information is not intended to replace advice given to you by your health care provider. Make sure you discuss any questions you have with your health care provider. Document Released: 04/21/2005 Document Revised: 03/19/2016 Document Reviewed: 03/19/2016 Elsevier Interactive Patient Education  2018 [employer].  Cholesterol Cholesterol is a white, waxy, fat-like substance that is needed by the human body in small amounts. The liver makes all the cholesterol we need. Cholesterol is carried from the liver by the blood through the blood vessels. Deposits of cholesterol (plaques) may build up on blood vessel (artery) walls. Plaques make the arteries narrower and stiffer. Cholesterol plaques increase the risk for heart attack and stroke. You cannot feel your cholesterol level even if it is very high. The only way to know that it is high is to have a blood test. Once you know your cholesterol levels, you should keep a record of the test results. Work with your health care provider to keep your levels in the desired range. What do the results mean?  Total cholesterol is a rough measure of all the cholesterol in your blood.  LDL (low-density lipoprotein) is the "bad" cholesterol. This is the type that causes plaque to build up on the artery walls. You want this level to be low.  HDL (high-density lipoprotein) is the "good" cholesterol because it cleans the arteries and carries the LDL away. You want this level to be high.  Triglycerides are fat that the body can either burn for energy or store. High levels are closely linked to heart disease. What are the desired levels of cholesterol?  Total cholesterol below 200.  LDL below 100 for people who are at risk, below 70 for people at very high risk.  HDL above 40 is good. A level of 60 or higher is considered to be protective against heart  disease.  Triglycerides below 150. How can I lower my cholesterol? Diet Follow your diet program as told by your health care provider.  Choose fish or white meat chicken and [place], roasted or baked. Limit fatty cuts of red meat, fried foods, and processed meats, such as sausage and lunch meats.  Eat lots of fresh fruits and vegetables.  Choose whole grains, beans, pasta, potatoes, and cereals.  Choose olive oil, corn oil, or canola oil, and use  only small amounts.  Avoid butter, mayonnaise, shortening, or palm kernel oils.  Avoid foods with trans fats.  Drink skim or nonfat milk and eat low-fat or nonfat yogurt and cheeses. Avoid whole milk, cream, ice cream, egg yolks, and full-fat cheeses.  Healthier desserts include angel food cake, ginger snaps, animal crackers, hard candy, popsicles, and low-fat or nonfat frozen yogurt. Avoid pastries, cakes, pies, and cookies.  Exercise  Follow your exercise program as told by your health care provider. A regular program: ? Helps to decrease LDL and raise HDL. ? Helps with weight control.  Do things that increase your activity level, such as gardening, walking, and taking the stairs.  Ask your health care provider about ways that you can be more active in your daily life.  Medicine  Take over-the-counter and prescription medicines only as told by your health care provider. ? Medicine may be prescribed by your health care provider to help lower cholesterol and decrease the risk for heart disease. This is usually done if diet and exercise have failed to bring down cholesterol levels. ? If you have several risk factors, you may need medicine even if your levels are normal.  This information is not intended to replace advice given to you by your health care provider. Make sure you discuss any questions you have with your health care provider. Document Released: 01/14/2001 Document Revised: 11/17/2015 Document Reviewed: 10/20/2015 Elsevier  Interactive Patient Education  [employer].

## 2017-05-01 NOTE — Progress Notes (Signed)
Chief Complaint  Patient presents with  . Follow-up    htn   F/u  1. HTN improved today from last visit but home visits 130s-160s/80s-99 on wrist meter she has checked it at work but does not remember reading. On increased dose ramipril She is getting h/as with BP elevation. She does snore but on apneic episodes she knows about will have husband monitor  2. Reviewed labs and pap.    Review of Systems  Respiratory: Negative for shortness of breath.   Cardiovascular: Negative for chest pain.  Gastrointestinal: Negative for abdominal pain.  Neurological: Positive for headaches.  Psychiatric/Behavioral:       +stress at work    Past Medical History:  Diagnosis Date  . Allergy   . Chicken pox   . Colon polyps   . Heart murmur    youth  . Hypertension   . Obesity   . Urgency incontinence    Past Surgical History:  Procedure Laterality Date  . BUNIONECTOMY Bilateral   . COLONOSCOPY    . COLONOSCOPY WITH PROPOFOL N/A 04/18/2016   Procedure: COLONOSCOPY WITH PROPOFOL;  Surgeon: Manya Silvas, MD;  Location: [facility] ENDOSCOPY;  Service: Endoscopy;  Laterality: N/A;  . TONSILLECTOMY AND ADENOIDECTOMY     Family History  Problem Relation Age of Onset  . Arthritis Mother   . Diabetes Mother   . Stroke Mother   . Arthritis Father   . Diabetes Father   . Early death Father   . Heart disease Father        MI 3 y.o   . Diabetes Sister    Social History   Socioeconomic History  . Marital status: Married    Spouse name: Not on file  . Number of children: Not on file  . Years of education: Not on file  . Highest education level: Not on file  Social Needs  . Financial resource strain: Not on file  . Food insecurity - worry: Not on file  . Food insecurity - inability: Not on file  . Transportation needs - medical: Not on file  . Transportation needs - non-medical: Not on file  Occupational History  . Not on file  Tobacco Use  . Smoking status: Never Smoker  . Smokeless  tobacco: Never Used  Substance and Sexual Activity  . Alcohol use: Not on file    Comment: occasionally wine   . Drug use: No  . Sexual activity: No  Other Topics Concern  . Not on file  Social History Narrative   Married    1 daughter age 55 as of 03/2017    Current Meds  Medication Sig  . aspirin EC 81 MG tablet Take 81 mg by mouth daily.  . Calcium-Magnesium-Vitamin D (CALCIUM 1200+D3 PO) Take by mouth 2 (two) times daily.  Marland Kitchen docusate sodium (COLACE) 50 MG capsule Take 50 mg by mouth daily.  . Multiple Vitamin (MULTI-VITAMINS) TABS Take by mouth.  . oxybutynin (DITROPAN-XL) 10 MG 24 hr tablet Take 1 tablet (10 mg total) by mouth at bedtime.  . ramipril (ALTACE) 10 MG capsule Take 1 capsule (10 mg total) by mouth daily.  . sennosides-docusate sodium (SENOKOT-S) 8.6-50 MG tablet Take 1 tablet by mouth daily.   Allergies  Allergen Reactions  . Terfenadine Rash   Recent Results (from the past 2160 hour(s))  Cytology - PAP     Status: None   Collection Time: 04/03/17 12:00 AM  Result Value Ref Range   Adequacy  Satisfactory for evaluation  endocervical/transformation zone component PRESENT.   Diagnosis      NEGATIVE FOR INTRAEPITHELIAL LESIONS OR MALIGNANCY.   HPV NOT DETECTED     Comment: Normal Reference Range - NOT Detected   Material Submitted CervicoVaginal Pap [ThinPrep Imaged]   CBC     Status: None   Collection Time: 04/03/17  9:22 AM  Result Value Ref Range   WBC 4.9 4.0 - 10.5 K/uL   RBC 3.93 3.87 - 5.11 Mil/uL   Platelets 190.0 150.0 - 400.0 K/uL   Hemoglobin 12.0 12.0 - 15.0 g/dL   HCT 36.7 36.0 - 46.0 %   MCV 93.4 78.0 - 100.0 fl   MCHC 32.8 30.0 - 36.0 g/dL   RDW 13.6 11.5 - 15.5 %  Comprehensive metabolic panel     Status: Abnormal   Collection Time: 04/03/17  9:22 AM  Result Value Ref Range   Sodium 141 135 - 145 mEq/L   Potassium 4.0 3.5 - 5.1 mEq/L   Chloride 105 96 - 112 mEq/L   CO2 31 19 - 32 mEq/L   Glucose, Bld 89 70 - 99 mg/dL   BUN 19  6 - 23 mg/dL   Creatinine, Ser 0.94 0.40 - 1.20 mg/dL   Total Bilirubin 0.5 0.2 - 1.2 mg/dL   Alkaline Phosphatase 23 (L) 39 - 117 U/L   AST 21 0 - 37 U/L   ALT 15 0 - 35 U/L   Total Protein 7.0 6.0 - 8.3 g/dL   Albumin 4.3 3.5 - 5.2 g/dL   Calcium 10.1 8.4 - 10.5 mg/dL   GFR 63.97 >60.00 mL/min  Urinalysis, Routine w reflex microscopic     Status: None   Collection Time: 04/03/17  9:22 AM  Result Value Ref Range   Color, Urine YELLOW Yellow;Lt. Yellow   APPearance CLEAR Clear   Specific Gravity, Urine 1.015 1.000 - 1.030   pH 7.0 5.0 - 8.0   Total Protein, Urine NEGATIVE Negative   Urine Glucose NEGATIVE Negative   Ketones, ur NEGATIVE Negative   Bilirubin Urine NEGATIVE Negative   Hgb urine dipstick NEGATIVE Negative   Urobilinogen, UA 0.2 0.0 - 1.0   Leukocytes, UA NEGATIVE Negative   Nitrite NEGATIVE Negative   WBC, UA none seen 0-2/hpf   RBC / HPF none seen 0-2/hpf  TSH     Status: None   Collection Time: 04/03/17  9:22 AM  Result Value Ref Range   TSH 1.01 0.35 - 4.50 uIU/mL  T4, free     Status: None   Collection Time: 04/03/17  9:22 AM  Result Value Ref Range   Free T4 0.82 0.60 - 1.60 ng/dL    Comment: Specimens from patients who are undergoing biotin therapy and /or ingesting biotin supplements may contain high levels of biotin.  The higher biotin concentration in these specimens interferes with this Free T4 assay.  Specimens that contain high levels  of biotin may cause false high results for this Free T4 assay.  Please interpret results in light of the total clinical presentation of the patient.    Lipid panel     Status: Abnormal   Collection Time: 04/03/17  9:22 AM  Result Value Ref Range   Cholesterol 215 (H) 0 - 200 mg/dL    Comment: ATP III Classification       Desirable:  < 200 mg/dL               Borderline High:  200 - 239  mg/dL          High:  > = 240 mg/dL   Triglycerides 43.0 0.0 - 149.0 mg/dL    Comment: Normal:  <150 mg/dLBorderline High:  150 -  199 mg/dL   HDL 82.40 >39.00 mg/dL   VLDL 8.6 0.0 - 40.0 mg/dL   LDL Cholesterol 124 (H) 0 - 99 mg/dL   Total CHOL/HDL Ratio 3     Comment:                Men          Women1/2 Average Risk     3.4          3.3Average Risk          5.0          4.42X Average Risk          9.6          7.13X Average Risk          15.0          11.0                       NonHDL 132.92     Comment: NOTE:  Non-HDL goal should be 30 mg/dL higher than patient's LDL goal (i.e. LDL goal of < 70 mg/dL, would have non-HDL goal of < 100 mg/dL)  Hepatitis C Antibody     Status: None   Collection Time: 04/03/17  9:23 AM  Result Value Ref Range   Hepatitis C Ab NON-REACTIVE NON-REACTI   SIGNAL TO CUT-OFF 0.01 <1.00   Objective  Body mass index is 28.31 kg/m. Wt Readings from Last 3 Encounters:  05/01/17 170 lb 2 oz (77.2 kg)  04/03/17 169 lb (76.7 kg)  04/18/16 162 lb (73.5 kg)   Temp Readings from Last 3 Encounters:  05/01/17 (!) 97.3 F (36.3 C) (Oral)  04/03/17 97.8 F (36.6 C) (Oral)  04/18/16 97.2 F (36.2 C)   BP Readings from Last 3 Encounters:  05/01/17 138/90  04/03/17 (!) 180/92  04/18/16 128/80   Pulse Readings from Last 3 Encounters:  05/01/17 61  04/03/17 78  04/18/16 70   O2 sat 97%   Physical Exam  Constitutional: She is oriented to person, place, and time and well-developed, well-nourished, and in no distress.  HENT:  Head: Normocephalic and atraumatic.  Mouth/Throat: Oropharynx is clear and moist and mucous membranes are normal.  Eyes: Conjunctivae are normal. Pupils are equal, round, and reactive to light.  Cardiovascular: Normal rate, regular rhythm and normal heart sounds.  Pulmonary/Chest: Effort normal and breath sounds normal.  Neurological: She is alert and oriented to person, place, and time. Gait normal. Gait normal.  Skin: Skin is warm, dry and intact.  Psychiatric: Mood, memory, affect and judgment normal.  Nursing note and vitals reviewed.   Assessment   1. HTN  improved but still with home readings elevated  2. HM 3. HLD  Plan  1. Cont ramipril 10, add norvasc 2.5  rec new upper arm meter automatic to purchase  Monitor signs of sleep apnea at home  If BP still remains uncontrolled consider 2/2 causes of HTN in future  F/u in 1 month  Prn tylenol for h/a ultimately control BP  2.  UTD flu, disc shingrix today and given info  Ask about year of Tdap at f/u   See other HM 04/03/17  mammo sch 05/22/17  Pap neg 04/03/18 neg HPV utd colonoscopy  due 04/18/2021  Hep C neg   3. rec healthy diet and exercise disc red yeast rice  Given up to date info about cholesterol   Provider: Dr. Olivia Mackie McLean-Scocuzza-[facility]

## 2017-05-22 ENCOUNTER — Ambulatory Visit
Admission: RE | Admit: 2017-05-22 | Discharge: 2017-05-22 | Disposition: A | Discharge status: Home / Self Care | Payer: BLUE CROSS/BLUE SHIELD | Source: Ambulatory Visit | Attending: Internal Medicine | Admitting: Internal Medicine

## 2017-05-22 DIAGNOSIS — Z1231 Encounter for screening mammogram for malignant neoplasm of breast: Secondary | ICD-10-CM

## 2017-05-29 ENCOUNTER — Ambulatory Visit (INDEPENDENT_AMBULATORY_CARE_PROVIDER_SITE_OTHER): Payer: BLUE CROSS/BLUE SHIELD | Admitting: Internal Medicine

## 2017-05-29 ENCOUNTER — Encounter: Payer: Self-pay | Admitting: Internal Medicine

## 2017-05-29 VITALS — BP 140/80 | HR 61 | Temp 98.2°F | Resp 16 | Ht 65.0 in | Wt 169.1 lb

## 2017-05-29 DIAGNOSIS — I10 Essential (primary) hypertension: Secondary | ICD-10-CM

## 2017-05-29 DIAGNOSIS — Z23 Encounter for immunization: Secondary | ICD-10-CM

## 2017-05-29 DIAGNOSIS — E785 Hyperlipidemia, unspecified: Secondary | ICD-10-CM

## 2017-05-29 MED ORDER — RAMIPRIL 10 MG PO CAPS: 10.0000 mg | ORAL_CAPSULE | Freq: Every day | ORAL | 3 refills | Status: DC

## 2017-05-29 MED ORDER — AMLODIPINE BESYLATE 2.5 MG PO TABS: 2.5000 mg | ORAL_TABLET | Freq: Every day | ORAL | 0 refills | Status: DC

## 2017-05-29 MED ORDER — RAMIPRIL 10 MG PO CAPS: 10.0000 mg | ORAL_CAPSULE | Freq: Every day | ORAL | 0 refills | Status: DC

## 2017-05-29 MED ORDER — AMLODIPINE BESYLATE 2.5 MG PO TABS: 2.5000 mg | ORAL_TABLET | Freq: Every day | ORAL | 3 refills | Status: DC

## 2017-05-29 NOTE — Patient Instructions (Addendum)
F/u 10/01/2017 fasting cholesterol lab Schedule appt 10/2017  Think about shingrix vaccine at your pharmacy  Take care   Cholesterol Cholesterol is a white, waxy, fat-like substance that is needed by the human body in small amounts. The liver makes all the cholesterol we need. Cholesterol is carried from the liver by the blood through the blood vessels. Deposits of cholesterol (plaques) may build up on blood vessel (artery) walls. Plaques make the arteries narrower and stiffer. Cholesterol plaques increase the risk for heart attack and stroke. You cannot feel your cholesterol level even if it is very high. The only way to know that it is high is to have a blood test. Once you know your cholesterol levels, you should keep a record of the test results. Work with your health care provider to keep your levels in the desired range. What do the results mean?  Total cholesterol is a rough measure of all the cholesterol in your blood.  LDL (low-density lipoprotein) is the "bad" cholesterol. This is the type that causes plaque to build up on the artery walls. You want this level to be low.  HDL (high-density lipoprotein) is the "good" cholesterol because it cleans the arteries and carries the LDL away. You want this level to be high.  Triglycerides are fat that the body can either burn for energy or store. High levels are closely linked to heart disease. What are the desired levels of cholesterol?  Total cholesterol below 200.  LDL below 100 for people who are at risk, below 70 for people at very high risk.  HDL above 40 is good. A level of 60 or higher is considered to be protective against heart disease.  Triglycerides below 150. How can I lower my cholesterol? Diet Follow your diet program as told by your health care provider.  Choose fish or white meat chicken and [place], roasted or baked. Limit fatty cuts of red meat, fried foods, and processed meats, such as sausage and lunch meats.  Eat  lots of fresh fruits and vegetables.  Choose whole grains, beans, pasta, potatoes, and cereals.  Choose olive oil, corn oil, or canola oil, and use only small amounts.  Avoid butter, mayonnaise, shortening, or palm kernel oils.  Avoid foods with trans fats.  Drink skim or nonfat milk and eat low-fat or nonfat yogurt and cheeses. Avoid whole milk, cream, ice cream, egg yolks, and full-fat cheeses.  Healthier desserts include angel food cake, ginger snaps, animal crackers, hard candy, popsicles, and low-fat or nonfat frozen yogurt. Avoid pastries, cakes, pies, and cookies.  Exercise  Follow your exercise program as told by your health care provider. A regular program: ? Helps to decrease LDL and raise HDL. ? Helps with weight control.  Do things that increase your activity level, such as gardening, walking, and taking the stairs.  Ask your health care provider about ways that you can be more active in your daily life.  Medicine  Take over-the-counter and prescription medicines only as told by your health care provider. ? Medicine may be prescribed by your health care provider to help lower cholesterol and decrease the risk for heart disease. This is usually done if diet and exercise have failed to bring down cholesterol levels. ? If you have several risk factors, you may need medicine even if your levels are normal.  This information is not intended to replace advice given to you by your health care provider. Make sure you discuss any questions you have with your health  care provider. Document Released: 01/14/2001 Document Revised: 11/17/2015 Document Reviewed: 10/20/2015 Elsevier Interactive Patient Education  [employer].

## 2017-05-29 NOTE — Addendum Note (Signed)
Addended by: Johna Sheriff on: 05/29/2017 09:48 AM   Modules accepted: Orders

## 2017-05-29 NOTE — Progress Notes (Signed)
Chief Complaint  Patient presents with  . Follow-up   F/u  1. HTN more controlled on Norvasc 2.5 and Altace 10  2. HLD she is taking red yeast rice   Review of Systems  Constitutional: Negative for weight loss.  HENT: Negative for hearing loss.   Respiratory: Negative for shortness of breath.   Cardiovascular: Negative for chest pain.  Musculoskeletal: Negative for falls.  Psychiatric/Behavioral: Negative for memory loss.   Past Medical History:  Diagnosis Date  . Allergy   . Chicken pox   . Colon polyps   . Heart murmur    youth  . Hypertension   . Obesity   . Urgency incontinence    Past Surgical History:  Procedure Laterality Date  . BUNIONECTOMY Bilateral   . COLONOSCOPY    . COLONOSCOPY WITH PROPOFOL N/A 04/18/2016   Procedure: COLONOSCOPY WITH PROPOFOL;  Surgeon: Manya Silvas, MD;  Location: [facility] ENDOSCOPY;  Service: Endoscopy;  Laterality: N/A;  . TONSILLECTOMY AND ADENOIDECTOMY     Family History  Problem Relation Age of Onset  . Arthritis Mother   . Diabetes Mother   . Stroke Mother   . Arthritis Father   . Diabetes Father   . Early death Father   . Heart disease Father        MI 83 y.o   . Diabetes Sister    Social History   Socioeconomic History  . Marital status: Married    Spouse name: Not on file  . Number of children: Not on file  . Years of education: Not on file  . Highest education level: Not on file  Social Needs  . Financial resource strain: Not on file  . Food insecurity - worry: Not on file  . Food insecurity - inability: Not on file  . Transportation needs - medical: Not on file  . Transportation needs - non-medical: Not on file  Occupational History  . Not on file  Tobacco Use  . Smoking status: Never Smoker  . Smokeless tobacco: Never Used  Substance and Sexual Activity  . Alcohol use: Not on file    Comment: occasionally wine   . Drug use: No  . Sexual activity: No  Other Topics Concern  . Not on file  Social  History Narrative   Married    1 daughter age 58 as of 03/2017    Current Meds  Medication Sig  . amLODipine (NORVASC) 2.5 MG tablet Take 1 tablet (2.5 mg total) by mouth daily.  Marland Kitchen aspirin EC 81 MG tablet Take 81 mg by mouth daily.  . Calcium-Magnesium-Vitamin D (CALCIUM 1200+D3 PO) Take by mouth 2 (two) times daily.  Marland Kitchen docusate sodium (COLACE) 50 MG capsule Take 50 mg by mouth daily.  . Multiple Vitamin (MULTI-VITAMINS) TABS Take by mouth.  . oxybutynin (DITROPAN-XL) 10 MG 24 hr tablet Take 1 tablet (10 mg total) by mouth at bedtime.  . ramipril (ALTACE) 10 MG capsule Take 1 capsule (10 mg total) by mouth daily.  . sennosides-docusate sodium (SENOKOT-S) 8.6-50 MG tablet Take 1 tablet by mouth daily.   Allergies  Allergen Reactions  . Terfenadine Rash   Recent Results (from the past 2160 hour(s))  Cytology - PAP     Status: None   Collection Time: 04/03/17 12:00 AM  Result Value Ref Range   Adequacy      Satisfactory for evaluation  endocervical/transformation zone component PRESENT.   Diagnosis      NEGATIVE FOR INTRAEPITHELIAL LESIONS OR MALIGNANCY.  HPV NOT DETECTED     Comment: Normal Reference Range - NOT Detected   Material Submitted CervicoVaginal Pap [ThinPrep Imaged]   CBC     Status: None   Collection Time: 04/03/17  9:22 AM  Result Value Ref Range   WBC 4.9 4.0 - 10.5 K/uL   RBC 3.93 3.87 - 5.11 Mil/uL   Platelets 190.0 150.0 - 400.0 K/uL   Hemoglobin 12.0 12.0 - 15.0 g/dL   HCT 36.7 36.0 - 46.0 %   MCV 93.4 78.0 - 100.0 fl   MCHC 32.8 30.0 - 36.0 g/dL   RDW 13.6 11.5 - 15.5 %  Comprehensive metabolic panel     Status: Abnormal   Collection Time: 04/03/17  9:22 AM  Result Value Ref Range   Sodium 141 135 - 145 mEq/L   Potassium 4.0 3.5 - 5.1 mEq/L   Chloride 105 96 - 112 mEq/L   CO2 31 19 - 32 mEq/L   Glucose, Bld 89 70 - 99 mg/dL   BUN 19 6 - 23 mg/dL   Creatinine, Ser 0.94 0.40 - 1.20 mg/dL   Total Bilirubin 0.5 0.2 - 1.2 mg/dL   Alkaline Phosphatase  23 (L) 39 - 117 U/L   AST 21 0 - 37 U/L   ALT 15 0 - 35 U/L   Total Protein 7.0 6.0 - 8.3 g/dL   Albumin 4.3 3.5 - 5.2 g/dL   Calcium 10.1 8.4 - 10.5 mg/dL   GFR 63.97 >60.00 mL/min  Urinalysis, Routine w reflex microscopic     Status: None   Collection Time: 04/03/17  9:22 AM  Result Value Ref Range   Color, Urine YELLOW Yellow;Lt. Yellow   APPearance CLEAR Clear   Specific Gravity, Urine 1.015 1.000 - 1.030   pH 7.0 5.0 - 8.0   Total Protein, Urine NEGATIVE Negative   Urine Glucose NEGATIVE Negative   Ketones, ur NEGATIVE Negative   Bilirubin Urine NEGATIVE Negative   Hgb urine dipstick NEGATIVE Negative   Urobilinogen, UA 0.2 0.0 - 1.0   Leukocytes, UA NEGATIVE Negative   Nitrite NEGATIVE Negative   WBC, UA none seen 0-2/hpf   RBC / HPF none seen 0-2/hpf  TSH     Status: None   Collection Time: 04/03/17  9:22 AM  Result Value Ref Range   TSH 1.01 0.35 - 4.50 uIU/mL  T4, free     Status: None   Collection Time: 04/03/17  9:22 AM  Result Value Ref Range   Free T4 0.82 0.60 - 1.60 ng/dL    Comment: Specimens from patients who are undergoing biotin therapy and /or ingesting biotin supplements may contain high levels of biotin.  The higher biotin concentration in these specimens interferes with this Free T4 assay.  Specimens that contain high levels  of biotin may cause false high results for this Free T4 assay.  Please interpret results in light of the total clinical presentation of the patient.    Lipid panel     Status: Abnormal   Collection Time: 04/03/17  9:22 AM  Result Value Ref Range   Cholesterol 215 (H) 0 - 200 mg/dL    Comment: ATP III Classification       Desirable:  < 200 mg/dL               Borderline High:  200 - 239 mg/dL          High:  > = 240 mg/dL   Triglycerides 43.0 0.0 - 149.0 mg/dL  Comment: Normal:  <150 mg/dLBorderline High:  150 - 199 mg/dL   HDL 82.40 >39.00 mg/dL   VLDL 8.6 0.0 - 40.0 mg/dL   LDL Cholesterol 124 (H) 0 - 99 mg/dL   Total  CHOL/HDL Ratio 3     Comment:                Men          Women1/2 Average Risk     3.4          3.3Average Risk          5.0          4.42X Average Risk          9.6          7.13X Average Risk          15.0          11.0                       NonHDL 132.92     Comment: NOTE:  Non-HDL goal should be 30 mg/dL higher than patient's LDL goal (i.e. LDL goal of < 70 mg/dL, would have non-HDL goal of < 100 mg/dL)  Hepatitis C Antibody     Status: None   Collection Time: 04/03/17  9:23 AM  Result Value Ref Range   Hepatitis C Ab NON-REACTIVE NON-REACTI   SIGNAL TO CUT-OFF 0.01 <1.00   Objective  Body mass index is 28.14 kg/m. Wt Readings from Last 3 Encounters:  05/29/17 169 lb 2 oz (76.7 kg)  05/01/17 170 lb 2 oz (77.2 kg)  04/03/17 169 lb (76.7 kg)   Temp Readings from Last 3 Encounters:  05/29/17 98.2 F (36.8 C) (Oral)  05/01/17 (!) 97.3 F (36.3 C) (Oral)  04/03/17 97.8 F (36.6 C) (Oral)   BP Readings from Last 3 Encounters:  05/29/17 140/80  05/01/17 138/90  04/03/17 (!) 180/92   Pulse Readings from Last 3 Encounters:  05/29/17 61  05/01/17 61  04/03/17 78   O2 sat room air 98%  Physical Exam  Constitutional: She is oriented to person, place, and time and well-developed, well-nourished, and in no distress. Vital signs are normal.  HENT:  Head: Normocephalic and atraumatic.  Mouth/Throat: Oropharynx is clear and moist and mucous membranes are normal.  Eyes: Conjunctivae are normal. Pupils are equal, round, and reactive to light.  Cardiovascular: Normal rate and regular rhythm.  Murmur heard. Pulmonary/Chest: Effort normal and breath sounds normal. She has no wheezes.  Neurological: She is alert and oriented to person, place, and time. Gait normal. Gait normal.  Skin: Skin is warm, dry and intact.  Psychiatric: Mood, memory, affect and judgment normal.  Nursing note and vitals reviewed.   Assessment   1. HTN  2. HLD  3. HM  Plan  1. Cont Altace 10 mg qd and  norvasc 2.5 mg qd  2. Red yeast rice 600 mg bid  Given handout  Check lipid 09/2017 and f/u after  Consider zetia in future if opposed to statin  3. Given Tdap today  See HM 05/01/17 Saw derm 04/2017 established  rec shingrix vaccine    Provider: Dr. Olivia Mackie McLean-Scocuzza-Internal Medicine

## 2017-10-02 ENCOUNTER — Other Ambulatory Visit (INDEPENDENT_AMBULATORY_CARE_PROVIDER_SITE_OTHER): Payer: BLUE CROSS/BLUE SHIELD

## 2017-10-02 DIAGNOSIS — I10 Essential (primary) hypertension: Secondary | ICD-10-CM | POA: Diagnosis not present

## 2017-10-02 DIAGNOSIS — E785 Hyperlipidemia, unspecified: Secondary | ICD-10-CM

## 2017-10-02 LAB — LIPID PANEL
Cholesterol: 201 mg/dL — ABNORMAL HIGH (ref 0–200)
HDL: 69.3 mg/dL (ref >39.00)
LDL Cholesterol: 117 mg/dL — ABNORMAL HIGH (ref 0–99)
NonHDL: 131.88
Total CHOL/HDL Ratio: 3
Triglycerides: 73 mg/dL (ref 0.0–149.0)
VLDL: 14.6 mg/dL (ref 0.0–40.0)

## 2017-10-02 LAB — BASIC METABOLIC PANEL
BUN: 18 mg/dL (ref 6–23)
CO2: 26 mEq/L (ref 19–32)
Calcium: 9.8 mg/dL (ref 8.4–10.5)
Chloride: 106 mEq/L (ref 96–112)
Creatinine, Ser: 1 mg/dL (ref 0.40–1.20)
GFR: 59.47 mL/min — ABNORMAL LOW (ref >60.00)
Glucose, Bld: 103 mg/dL — ABNORMAL HIGH (ref 70–99)
Potassium: 4.3 mEq/L (ref 3.5–5.1)
Sodium: 141 mEq/L (ref 135–145)

## 2017-10-09 ENCOUNTER — Ambulatory Visit (INDEPENDENT_AMBULATORY_CARE_PROVIDER_SITE_OTHER): Payer: BLUE CROSS/BLUE SHIELD | Admitting: Internal Medicine

## 2017-10-09 ENCOUNTER — Encounter: Payer: Self-pay | Admitting: Internal Medicine

## 2017-10-09 VITALS — BP 146/80 | HR 57 | Temp 98.1°F | Ht 65.0 in | Wt 164.8 lb

## 2017-10-09 DIAGNOSIS — L821 Other seborrheic keratosis: Secondary | ICD-10-CM | POA: Insufficient documentation

## 2017-10-09 DIAGNOSIS — R739 Hyperglycemia, unspecified: Secondary | ICD-10-CM | POA: Diagnosis not present

## 2017-10-09 DIAGNOSIS — I10 Essential (primary) hypertension: Secondary | ICD-10-CM | POA: Diagnosis not present

## 2017-10-09 DIAGNOSIS — E785 Hyperlipidemia, unspecified: Secondary | ICD-10-CM | POA: Diagnosis not present

## 2017-10-09 DIAGNOSIS — L570 Actinic keratosis: Secondary | ICD-10-CM | POA: Insufficient documentation

## 2017-10-09 DIAGNOSIS — E663 Overweight: Secondary | ICD-10-CM | POA: Insufficient documentation

## 2017-10-09 MED ORDER — TELMISARTAN 40 MG PO TABS: 40.0000 mg | ORAL_TABLET | Freq: Every day | ORAL | 1 refills | Status: DC

## 2017-10-09 NOTE — Progress Notes (Signed)
Chief Complaint  Patient presents with  . Follow-up  . Hypertension   F/u  1. HTN elevated ramipril 10 mg qd, norvasc 2.5 causing leg edema at end of day BP 158/80, 146/80  2. HLD she reports was not fasting last labs 10/02/17 she is taking red yeast rice and does not want to take further meds for now  3. Right heel pain better since seeing foot MD since last visit  4. Saw derm yesterday Dr. Raliegh Ip LN2 to face for Aks forehead and Sks right temple   Review of Systems  Constitutional: Positive for weight loss.  HENT: Negative for hearing loss.   Eyes: Negative for blurred vision.  Respiratory: Negative for shortness of breath.   Cardiovascular: Negative for chest pain.  Skin: Positive for itching.  Neurological: Negative for headaches.  Psychiatric/Behavioral: Negative for depression.   Past Medical History:  Diagnosis Date  . Actinic keratoses   . Allergy   . Chicken pox   . Colon polyps   . Heart murmur    youth  . Hypertension   . Obesity   . Urgency incontinence    Past Surgical History:  Procedure Laterality Date  . BUNIONECTOMY Bilateral   . COLONOSCOPY    . COLONOSCOPY WITH PROPOFOL N/A 04/18/2016   Procedure: COLONOSCOPY WITH PROPOFOL;  Surgeon: Manya Silvas, MD;  Location: [facility] ENDOSCOPY;  Service: Endoscopy;  Laterality: N/A;  . TONSILLECTOMY AND ADENOIDECTOMY     Family History  Problem Relation Age of Onset  . Arthritis Mother   . Diabetes Mother   . Stroke Mother   . Arthritis Father   . Diabetes Father   . Early death Father   . Heart disease Father        MI 37 y.o   . Diabetes Sister    Social History   Socioeconomic History  . Marital status: Married    Spouse name: Not on file  . Number of children: Not on file  . Years of education: Not on file  . Highest education level: Not on file  Occupational History  . Not on file  Social Needs  . Financial resource strain: Not on file  . Food insecurity:    Worry: Not on file    Inability: Not  on file  . Transportation needs:    Medical: Not on file    Non-medical: Not on file  Tobacco Use  . Smoking status: Never Smoker  . Smokeless tobacco: Never Used  Substance and Sexual Activity  . Alcohol use: Not on file    Comment: occasionally wine   . Drug use: No  . Sexual activity: Never  Lifestyle  . Physical activity:    Days per week: Not on file    Minutes per session: Not on file  . Stress: Not on file  Relationships  . Social connections:    Talks on phone: Not on file    Gets together: Not on file    Attends religious service: Not on file    Active member of club or organization: Not on file    Attends meetings of clubs or organizations: Not on file    Relationship status: Not on file  . Intimate partner violence:    Fear of current or ex partner: Not on file    Emotionally abused: Not on file    Physically abused: Not on file    Forced sexual activity: Not on file  Other Topics Concern  . Not on file  Social History Narrative   Married    1 daughter age 22 as of 03/2017    Current Meds  Medication Sig  . amLODipine (NORVASC) 2.5 MG tablet Take 1 tablet (2.5 mg total) by mouth daily.  Marland Kitchen aspirin EC 81 MG tablet Take 81 mg by mouth daily.  . Calcium-Magnesium-Vitamin D (CALCIUM 1200+D3 PO) Take by mouth 2 (two) times daily.  Marland Kitchen docusate sodium (COLACE) 50 MG capsule Take 50 mg by mouth daily.  . Multiple Vitamin (MULTI-VITAMINS) TABS Take by mouth.  . oxybutynin (DITROPAN-XL) 10 MG 24 hr tablet Take 1 tablet (10 mg total) by mouth at bedtime.  . ramipril (ALTACE) 10 MG capsule Take 1 capsule (10 mg total) by mouth daily.  . sennosides-docusate sodium (SENOKOT-S) 8.6-50 MG tablet Take 1 tablet by mouth daily.   Allergies  Allergen Reactions  . Terfenadine Rash   Recent Results (from the past 2160 hour(s))  Basic Metabolic Panel (BMET)     Status: Abnormal   Collection Time: 10/02/17  8:22 AM  Result Value Ref Range   Sodium 141 135 - 145 mEq/L    Potassium 4.3 3.5 - 5.1 mEq/L   Chloride 106 96 - 112 mEq/L   CO2 26 19 - 32 mEq/L   Glucose, Bld 103 (H) 70 - 99 mg/dL   BUN 18 6 - 23 mg/dL   Creatinine, Ser 1.00 0.40 - 1.20 mg/dL   Calcium 9.8 8.4 - 10.5 mg/dL   GFR 59.47 (L) >60.00 mL/min  Lipid panel     Status: Abnormal   Collection Time: 10/02/17  8:22 AM  Result Value Ref Range   Cholesterol 201 (H) 0 - 200 mg/dL    Comment: ATP III Classification       Desirable:  < 200 mg/dL               Borderline High:  200 - 239 mg/dL          High:  > = 240 mg/dL   Triglycerides 73.0 0.0 - 149.0 mg/dL    Comment: Normal:  <150 mg/dLBorderline High:  150 - 199 mg/dL   HDL 69.30 >39.00 mg/dL   VLDL 14.6 0.0 - 40.0 mg/dL   LDL Cholesterol 117 (H) 0 - 99 mg/dL   Total CHOL/HDL Ratio 3     Comment:                Men          Women1/2 Average Risk     3.4          3.3Average Risk          5.0          4.42X Average Risk          9.6          7.13X Average Risk          15.0          11.0                       NonHDL 131.88     Comment: NOTE:  Non-HDL goal should be 30 mg/dL higher than patient's LDL goal (i.e. LDL goal of < 70 mg/dL, would have non-HDL goal of < 100 mg/dL)   Objective  Body mass index is 27.42 kg/m. Wt Readings from Last 3 Encounters:  10/09/17 164 lb 12.8 oz (74.8 kg)  05/29/17 169 lb 2 oz (76.7 kg)  05/01/17 170 lb  2 oz (77.2 kg)   Temp Readings from Last 3 Encounters:  10/09/17 98.1 F (36.7 C) (Oral)  05/29/17 98.2 F (36.8 C) (Oral)  05/01/17 (!) 97.3 F (36.3 C) (Oral)   BP Readings from Last 3 Encounters:  10/09/17 (!) 158/80  05/29/17 140/80  05/01/17 138/90   Pulse Readings from Last 3 Encounters:  10/09/17 (!) 57  05/29/17 61  05/01/17 61    Physical Exam  Constitutional: She is oriented to person, place, and time. Vital signs are normal. She appears well-developed and well-nourished. She is cooperative.  HENT:  Head: Normocephalic and atraumatic.  Mouth/Throat: Oropharynx is clear and moist  and mucous membranes are normal.  Eyes: Pupils are equal, round, and reactive to light. Conjunctivae are normal.  Cardiovascular: Normal rate, regular rhythm and normal heart sounds.  Pulmonary/Chest: Effort normal and breath sounds normal.  Neurological: She is alert and oriented to person, place, and time. Gait normal.  Skin: Skin is warm, dry and intact.  Psychiatric: She has a normal mood and affect. Her speech is normal and behavior is normal. Judgment and thought content normal. Cognition and memory are normal.  Nursing note and vitals reviewed.   Assessment   1. HTN  2. HLD  3. Right heel pain  4. AKs, SKs forehead s/p LN2  5. HM Plan   1. Stop ramipril 10 mg change to telmisartan 40 mg qd  Hold norvasc 2.5 and if BP elevating resume RN visit next Friday to check machine  Call in 1-2 weeks about BP readings with med change  2. Repeat in 4 months labs  Red yeast rice taking  3. Improved ntd  4. Saw derm 10/08/17 Dr. Raliegh Ip  5.  Flu had 03/19/17  Tdap 05/29/17  Disc shingrix vaccine has not had yet  Never smoker   Colonoscopy 04/18/16 I[facility], diverticulosis f/u in 5 years   Pap had 04/03/18   F/u Dr. Melvern Sample saw 10/08/17          Provider: Dr. Olivia Mackie McLean-Scocuzza-Internal Medicine

## 2017-10-09 NOTE — Patient Instructions (Addendum)
Stop ramipril and start telmisartan 40 mg daily in am  Call me in 1-2 weeks with your BP readings  Hold norvasc 2.5 mg for now and if BP still elevated add it back  Call pharmacy about Shingrix  Follow up in 4 months fasting labs before next visit   Hypertension Hypertension, commonly called high blood pressure, is when the force of blood pumping through the arteries is too strong. The arteries are the blood vessels that carry blood from the heart throughout the body. Hypertension forces the heart to work harder to pump blood and may cause arteries to become narrow or stiff. Having untreated or uncontrolled hypertension can cause heart attacks, strokes, kidney disease, and other problems. A blood pressure reading consists of a higher number over a lower number. Ideally, your blood pressure should be below 120/80. The first ("top") number is called the systolic pressure. It is a measure of the pressure in your arteries as your heart beats. The second ("bottom") number is called the diastolic pressure. It is a measure of the pressure in your arteries as the heart relaxes. What are the causes? The cause of this condition is not known. What increases the risk? Some risk factors for high blood pressure are under your control. Others are not. Factors you can change  Smoking.  Having type 2 diabetes mellitus, high cholesterol, or both.  Not getting enough exercise or physical activity.  Being overweight.  Having too much fat, sugar, calories, or salt (sodium) in your diet.  Drinking too much alcohol. Factors that are difficult or impossible to change  Having chronic kidney disease.  Having a family history of high blood pressure.  Age. Risk increases with age.  Race. You may be at higher risk if you are African-American.  Gender. Men are at higher risk than women before age 60. After age 17, women are at higher risk than men.  Having obstructive sleep apnea.  Stress. What are the  signs or symptoms? Extremely high blood pressure (hypertensive crisis) may cause:  Headache.  Anxiety.  Shortness of breath.  Nosebleed.  Nausea and vomiting.  Severe chest pain.  Jerky movements you cannot control (seizures).  How is this diagnosed? This condition is diagnosed by measuring your blood pressure while you are seated, with your arm resting on a surface. The cuff of the blood pressure monitor will be placed directly against the skin of your upper arm at the level of your heart. It should be measured at least twice using the same arm. Certain conditions can cause a difference in blood pressure between your right and left arms. Certain factors can cause blood pressure readings to be lower or higher than normal (elevated) for a short period of time:  When your blood pressure is higher when you are in a health care provider's office than when you are at home, this is called white coat hypertension. Most people with this condition do not need medicines.  When your blood pressure is higher at home than when you are in a health care provider's office, this is called masked hypertension. Most people with this condition may need medicines to control blood pressure.  If you have a high blood pressure reading during one visit or you have normal blood pressure with other risk factors:  You may be asked to return on a different day to have your blood pressure checked again.  You may be asked to monitor your blood pressure at home for 1 week or longer.  If you are diagnosed with hypertension, you may have other blood or imaging tests to help your health care provider understand your overall risk for other conditions. How is this treated? This condition is treated by making healthy lifestyle changes, such as eating healthy foods, exercising more, and reducing your alcohol intake. Your health care provider may prescribe medicine if lifestyle changes are not enough to get your blood  pressure under control, and if:  Your systolic blood pressure is above 130.  Your diastolic blood pressure is above 80.  Your personal target blood pressure may vary depending on your medical conditions, your age, and other factors. Follow these instructions at home: Eating and drinking  Eat a diet that is high in fiber and potassium, and low in sodium, added sugar, and fat. An example eating plan is called the DASH (Dietary Approaches to Stop Hypertension) diet. To eat this way: ? Eat plenty of fresh fruits and vegetables. Try to fill half of your plate at each meal with fruits and vegetables. ? Eat whole grains, such as whole wheat pasta, brown rice, or whole grain bread. Fill about one quarter of your plate with whole grains. ? Eat or drink low-fat dairy products, such as skim milk or low-fat yogurt. ? Avoid fatty cuts of meat, processed or cured meats, and poultry with skin. Fill about one quarter of your plate with lean proteins, such as fish, chicken without skin, beans, eggs, and tofu. ? Avoid premade and processed foods. These tend to be higher in sodium, added sugar, and fat.  Reduce your daily sodium intake. Most people with hypertension should eat less than 1,500 mg of sodium a day.  Limit alcohol intake to no more than 1 drink a day for nonpregnant women and 2 drinks a day for men. One drink equals 12 oz of beer, 5 oz of wine, or 1 oz of hard liquor. Lifestyle  Work with your health care provider to maintain a healthy body weight or to lose weight. Ask what an ideal weight is for you.  Get at least 30 minutes of exercise that causes your heart to beat faster (aerobic exercise) most days of the week. Activities may include walking, swimming, or biking.  Include exercise to strengthen your muscles (resistance exercise), such as pilates or lifting weights, as part of your weekly exercise routine. Try to do these types of exercises for 30 minutes at least 3 days a week.  Do not  use any products that contain nicotine or tobacco, such as cigarettes and e-cigarettes. If you need help quitting, ask your health care provider.  Monitor your blood pressure at home as told by your health care provider.  Keep all follow-up visits as told by your health care provider. This is important. Medicines  Take over-the-counter and prescription medicines only as told by your health care provider. Follow directions carefully. Blood pressure medicines must be taken as prescribed.  Do not skip doses of blood pressure medicine. Doing this puts you at risk for problems and can make the medicine less effective.  Ask your health care provider about side effects or reactions to medicines that you should watch for. Contact a health care provider if:  You think you are having a reaction to a medicine you are taking.  You have headaches that keep coming back (recurring).  You feel dizzy.  You have swelling in your ankles.  You have trouble with your vision. Get help right away if:  You develop a severe headache  or confusion.  You have unusual weakness or numbness.  You feel faint.  You have severe pain in your chest or abdomen.  You vomit repeatedly.  You have trouble breathing. Summary  Hypertension is when the force of blood pumping through your arteries is too strong. If this condition is not controlled, it may put you at risk for serious complications.  Your personal target blood pressure may vary depending on your medical conditions, your age, and other factors. For most people, a normal blood pressure is less than 120/80.  Hypertension is treated with lifestyle changes, medicines, or a combination of both. Lifestyle changes include weight loss, eating a healthy, low-sodium diet, exercising more, and limiting alcohol. This information is not intended to replace advice given to you by your health care provider. Make sure you discuss any questions you have with your health  care provider. Document Released: 04/21/2005 Document Revised: 03/19/2016 Document Reviewed: 03/19/2016 Elsevier Interactive Patient Education  [employer].

## 2017-10-09 NOTE — Progress Notes (Signed)
Pre visit review using our clinic review tool, if applicable. No additional management support is needed unless otherwise documented below in the visit note. 

## 2017-10-23 ENCOUNTER — Ambulatory Visit (INDEPENDENT_AMBULATORY_CARE_PROVIDER_SITE_OTHER): Payer: BLUE CROSS/BLUE SHIELD

## 2017-10-23 DIAGNOSIS — I10 Essential (primary) hypertension: Secondary | ICD-10-CM | POA: Diagnosis not present

## 2017-10-23 NOTE — Progress Notes (Signed)
Reviewed  TMS 

## 2017-10-23 NOTE — Progress Notes (Signed)
Patient came in to see if her BP Cuff is Callibrated to match what the BP we are getting in office.  Patients BP was taken manually of left arm, BP- 136/78 BPM- 54.  After about two minutes the BP was taken with patients machine of left arm, BP- 138/82 BPM- 58.  Informed patient that what she is getting on her machine is a very good representation of her Current BP.  Due to the number being 2-4 points apart.  Patient stated she had no questions, comments, and or concerns at this time.

## 2017-11-10 ENCOUNTER — Other Ambulatory Visit: Payer: Self-pay

## 2017-11-10 ENCOUNTER — Telehealth: Payer: Self-pay | Admitting: Internal Medicine

## 2017-11-10 DIAGNOSIS — I10 Essential (primary) hypertension: Secondary | ICD-10-CM

## 2017-11-10 MED ORDER — TELMISARTAN 40 MG PO TABS: 40.0000 mg | ORAL_TABLET | Freq: Every day | ORAL | 1 refills | Status: DC

## 2017-11-10 NOTE — Telephone Encounter (Unsigned)
Copied from [id number]. Topic: Quick Communication - Rx Refill/Question >> Nov 10, 2017  1:05 PM Judyann Munson wrote: Medication: medication telmisartan (MICARDIS) 40 MG tablet.    Has the patient contacted their pharmacy? Yes.Patient  stated  the pharmacy is requesting to changing the brand.    Preferred Pharmacy (with phone number or street name): [facility] [place], [place] [facility] [employer] [facility] [phone] (Phone) [phone] (Fax)

## 2017-11-17 ENCOUNTER — Other Ambulatory Visit: Payer: Self-pay | Admitting: Internal Medicine

## 2017-11-17 DIAGNOSIS — I10 Essential (primary) hypertension: Secondary | ICD-10-CM

## 2017-11-17 MED ORDER — TELMISARTAN 40 MG PO TABS: 40.0000 mg | ORAL_TABLET | Freq: Every day | ORAL | 3 refills | Status: DC

## 2017-12-07 ENCOUNTER — Telehealth: Payer: Self-pay | Admitting: Internal Medicine

## 2017-12-07 DIAGNOSIS — I10 Essential (primary) hypertension: Secondary | ICD-10-CM

## 2017-12-07 NOTE — Telephone Encounter (Signed)
Call pt should have telmisartan via [facility] sent [facility] request Does she need this?  If not call pharmacy and dc [phone] [facility]  [place]

## 2017-12-08 ENCOUNTER — Other Ambulatory Visit: Payer: Self-pay | Admitting: Internal Medicine

## 2017-12-08 DIAGNOSIS — I10 Essential (primary) hypertension: Secondary | ICD-10-CM

## 2017-12-08 MED ORDER — TELMISARTAN 40 MG PO TABS: 40.0000 mg | ORAL_TABLET | Freq: Every day | ORAL | 0 refills | Status: DC

## 2017-12-08 NOTE — Telephone Encounter (Signed)
FYI

## 2017-12-08 NOTE — Telephone Encounter (Signed)
Pt states that she would like the telmisartan (MICARDIS) 40 MG tablet to be sent to  [id number] [place] - [address] [phone] (Phone) [phone] (Fax)     She is still trying out this medication so she would not like for it to be sent to the mail order pharmacy at this time.

## 2017-12-09 MED ORDER — TELMISARTAN 40 MG PO TABS: 40.0000 mg | ORAL_TABLET | Freq: Every day | ORAL | 3 refills | Status: DC

## 2017-12-09 NOTE — Telephone Encounter (Signed)
Medication has been refilled.

## 2017-12-09 NOTE — Addendum Note (Signed)
Addended by: Elpidio Galea T on: 12/09/2017 02:35 PM   Modules accepted: Orders

## 2018-02-05 ENCOUNTER — Other Ambulatory Visit (INDEPENDENT_AMBULATORY_CARE_PROVIDER_SITE_OTHER): Payer: BLUE CROSS/BLUE SHIELD

## 2018-02-05 DIAGNOSIS — I10 Essential (primary) hypertension: Secondary | ICD-10-CM

## 2018-02-05 DIAGNOSIS — E785 Hyperlipidemia, unspecified: Secondary | ICD-10-CM | POA: Diagnosis not present

## 2018-02-05 DIAGNOSIS — R739 Hyperglycemia, unspecified: Secondary | ICD-10-CM

## 2018-02-05 LAB — LIPID PANEL
Cholesterol: 203 mg/dL — ABNORMAL HIGH (ref 0–200)
HDL: 74.2 mg/dL (ref >39.00)
LDL Cholesterol: 118 mg/dL — ABNORMAL HIGH (ref 0–99)
NonHDL: 128.95
Total CHOL/HDL Ratio: 3
Triglycerides: 53 mg/dL (ref 0.0–149.0)
VLDL: 10.6 mg/dL (ref 0.0–40.0)

## 2018-02-05 LAB — BASIC METABOLIC PANEL
BUN: 26 mg/dL — ABNORMAL HIGH (ref 6–23)
CO2: 29 mEq/L (ref 19–32)
Calcium: 9.6 mg/dL (ref 8.4–10.5)
Chloride: 107 mEq/L (ref 96–112)
Creatinine, Ser: 1.06 mg/dL (ref 0.40–1.20)
GFR: 55.54 mL/min — ABNORMAL LOW (ref >60.00)
Glucose, Bld: 90 mg/dL (ref 70–99)
Potassium: 4.3 mEq/L (ref 3.5–5.1)
Sodium: 143 mEq/L (ref 135–145)

## 2018-02-05 LAB — HEMOGLOBIN A1C: Hgb A1c MFr Bld: 5.7 % (ref 4.6–6.5)

## 2018-02-12 ENCOUNTER — Encounter: Payer: Self-pay | Admitting: Internal Medicine

## 2018-02-12 ENCOUNTER — Ambulatory Visit (INDEPENDENT_AMBULATORY_CARE_PROVIDER_SITE_OTHER): Payer: BLUE CROSS/BLUE SHIELD | Admitting: Internal Medicine

## 2018-02-12 VITALS — BP 168/80 | HR 51 | Temp 97.8°F | Resp 16 | Ht 65.0 in | Wt 169.2 lb

## 2018-02-12 DIAGNOSIS — Z1231 Encounter for screening mammogram for malignant neoplasm of breast: Secondary | ICD-10-CM

## 2018-02-12 DIAGNOSIS — Z23 Encounter for immunization: Secondary | ICD-10-CM

## 2018-02-12 DIAGNOSIS — M545 Low back pain, unspecified: Secondary | ICD-10-CM

## 2018-02-12 DIAGNOSIS — G8929 Other chronic pain: Secondary | ICD-10-CM

## 2018-02-12 DIAGNOSIS — E785 Hyperlipidemia, unspecified: Secondary | ICD-10-CM

## 2018-02-12 DIAGNOSIS — Z0184 Encounter for antibody response examination: Secondary | ICD-10-CM

## 2018-02-12 DIAGNOSIS — M25552 Pain in left hip: Secondary | ICD-10-CM

## 2018-02-12 DIAGNOSIS — I10 Essential (primary) hypertension: Secondary | ICD-10-CM | POA: Diagnosis not present

## 2018-02-12 DIAGNOSIS — Z1159 Encounter for screening for other viral diseases: Secondary | ICD-10-CM

## 2018-02-12 MED ORDER — AMLODIPINE BESYLATE 2.5 MG PO TABS: 2.5000 mg | ORAL_TABLET | Freq: Every day | ORAL | 3 refills | Status: DC

## 2018-02-12 MED ORDER — TELMISARTAN 40 MG PO TABS: 40.0000 mg | ORAL_TABLET | Freq: Every day | ORAL | 3 refills | Status: DC

## 2018-02-12 NOTE — Progress Notes (Signed)
Chief Complaint  Patient presents with  . Follow-up  . Hypertension   F/u  1. HTN uncontrolled on telmisartan 40 mg qd stopped norvasc 2.5 per pt BP at home 109-138/78  2. Left hip and low back ache occasionally 6-7/10 tylenol helps but pain wakes up at night  3. HLD rec statin tried red yeast rice w/o improved pt declines statin for now also disc zetia   Of note pt retired as of 12/2017    Review of Systems  Constitutional: Negative for weight loss.  HENT: Negative for hearing loss.   Eyes: Negative for blurred vision.  Respiratory: Negative for shortness of breath.   Cardiovascular: Negative for chest pain.  Musculoskeletal: Positive for back pain and joint pain.  Skin: Negative for rash.  Neurological: Negative for headaches.  Psychiatric/Behavioral: Negative for depression.   Past Medical History:  Diagnosis Date  . Actinic keratoses   . Allergy   . Chicken pox   . Colon polyps   . Heart murmur    youth  . Hyperlipidemia   . Hypertension   . Obesity   . Urgency incontinence    Past Surgical History:  Procedure Laterality Date  . BUNIONECTOMY Bilateral   . COLONOSCOPY    . COLONOSCOPY WITH PROPOFOL N/A 04/18/2016   Procedure: COLONOSCOPY WITH PROPOFOL;  Surgeon: Manya Silvas, MD;  Location: [facility] ENDOSCOPY;  Service: Endoscopy;  Laterality: N/A;  . TONSILLECTOMY AND ADENOIDECTOMY     Family History  Problem Relation Age of Onset  . Arthritis Mother   . Diabetes Mother   . Stroke Mother   . Arthritis Father   . Diabetes Father   . Early death Father   . Heart disease Father        MI 49 y.o   . Diabetes Sister    Social History   Socioeconomic History  . Marital status: Married    Spouse name: Not on file  . Number of children: Not on file  . Years of education: Not on file  . Highest education level: Not on file  Occupational History  . Not on file  Social Needs  . Financial resource strain: Not on file  . Food insecurity:    Worry: Not on  file    Inability: Not on file  . Transportation needs:    Medical: Not on file    Non-medical: Not on file  Tobacco Use  . Smoking status: Never Smoker  . Smokeless tobacco: Never Used  Substance and Sexual Activity  . Alcohol use: Not on file    Comment: occasionally wine   . Drug use: No  . Sexual activity: Never  Lifestyle  . Physical activity:    Days per week: Not on file    Minutes per session: Not on file  . Stress: Not on file  Relationships  . Social connections:    Talks on phone: Not on file    Gets together: Not on file    Attends religious service: Not on file    Active member of club or organization: Not on file    Attends meetings of clubs or organizations: Not on file    Relationship status: Not on file  . Intimate partner violence:    Fear of current or ex partner: Not on file    Emotionally abused: Not on file    Physically abused: Not on file    Forced sexual activity: Not on file  Other Topics Concern  . Not on  file  Social History Narrative   Married    1 daughter age 50 as of 03/2017    Current Meds  Medication Sig  . aspirin EC 81 MG tablet Take 81 mg by mouth daily.  . Calcium-Magnesium-Vitamin D (CALCIUM 1200+D3 PO) Take by mouth 2 (two) times daily.  Marland Kitchen docusate sodium (COLACE) 50 MG capsule Take 50 mg by mouth daily.  . Multiple Vitamin (MULTI-VITAMINS) TABS Take by mouth.  . oxybutynin (DITROPAN-XL) 10 MG 24 hr tablet Take 1 tablet (10 mg total) by mouth at bedtime.  . sennosides-docusate sodium (SENOKOT-S) 8.6-50 MG tablet Take 1 tablet by mouth daily.  Marland Kitchen telmisartan (MICARDIS) 40 MG tablet Take 1 tablet (40 mg total) by mouth daily.  Marland Kitchen telmisartan (MICARDIS) 40 MG tablet Take 1 tablet (40 mg total) by mouth daily.  . [DISCONTINUED] amLODipine (NORVASC) 2.5 MG tablet Take 1 tablet (2.5 mg total) by mouth daily.  . [DISCONTINUED] telmisartan (MICARDIS) 40 MG tablet Take 1 tablet (40 mg total) by mouth daily.   Allergies  Allergen  Reactions  . Terfenadine Rash   Recent Results (from the past 2160 hour(s))  Basic Metabolic Panel (BMET)     Status: Abnormal   Collection Time: 02/05/18  8:02 AM  Result Value Ref Range   Sodium 143 135 - 145 mEq/L   Potassium 4.3 3.5 - 5.1 mEq/L   Chloride 107 96 - 112 mEq/L   CO2 29 19 - 32 mEq/L   Glucose, Bld 90 70 - 99 mg/dL   BUN 26 (H) 6 - 23 mg/dL   Creatinine, Ser 1.06 0.40 - 1.20 mg/dL   Calcium 9.6 8.4 - 10.5 mg/dL   GFR 55.54 (L) >60.00 mL/min  Hemoglobin A1c     Status: None   Collection Time: 02/05/18  8:02 AM  Result Value Ref Range   Hgb A1c MFr Bld 5.7 4.6 - 6.5 %    Comment: Glycemic Control Guidelines for People with Diabetes:Non Diabetic:  <6%Goal of Therapy: <7%Additional Action Suggested:  >8%   Lipid panel     Status: Abnormal   Collection Time: 02/05/18  8:02 AM  Result Value Ref Range   Cholesterol 203 (H) 0 - 200 mg/dL    Comment: ATP III Classification       Desirable:  < 200 mg/dL               Borderline High:  200 - 239 mg/dL          High:  > = 240 mg/dL   Triglycerides 53.0 0.0 - 149.0 mg/dL    Comment: Normal:  <150 mg/dLBorderline High:  150 - 199 mg/dL   HDL 74.20 >39.00 mg/dL   VLDL 10.6 0.0 - 40.0 mg/dL   LDL Cholesterol 118 (H) 0 - 99 mg/dL   Total CHOL/HDL Ratio 3     Comment:                Men          Women1/2 Average Risk     3.4          3.3Average Risk          5.0          4.42X Average Risk          9.6          7.13X Average Risk          15.0          11.0  NonHDL 128.95     Comment: NOTE:  Non-HDL goal should be 30 mg/dL higher than patient's LDL goal (i.e. LDL goal of < 70 mg/dL, would have non-HDL goal of < 100 mg/dL)   Objective  Body mass index is 28.16 kg/m. Wt Readings from Last 3 Encounters:  02/12/18 169 lb 4 oz (76.8 kg)  10/09/17 164 lb 12.8 oz (74.8 kg)  05/29/17 169 lb 2 oz (76.7 kg)   Temp Readings from Last 3 Encounters:  02/12/18 97.8 F (36.6 C) (Oral)  10/09/17 98.1 F (36.7 C)  (Oral)  05/29/17 98.2 F (36.8 C) (Oral)   BP Readings from Last 3 Encounters:  02/12/18 (!) 168/80  10/09/17 (!) 146/80  05/29/17 140/80   Pulse Readings from Last 3 Encounters:  02/12/18 (!) 51  10/09/17 (!) 57  05/29/17 61    Physical Exam  Constitutional: She is oriented to person, place, and time. Vital signs are normal. She appears well-developed and well-nourished. She is cooperative.  HENT:  Head: Normocephalic and atraumatic.  Mouth/Throat: Oropharynx is clear and moist and mucous membranes are normal.  Eyes: Pupils are equal, round, and reactive to light. Conjunctivae are normal.  Cardiovascular: Normal rate, regular rhythm and normal heart sounds.  Pulmonary/Chest: Effort normal and breath sounds normal.  Musculoskeletal:       Left hip: She exhibits decreased range of motion and tenderness.  Neurological: She is alert and oriented to person, place, and time. Gait normal.  Skin: Skin is warm, dry and intact.  Psychiatric: She has a normal mood and affect. Her speech is normal and behavior is normal. Judgment and thought content normal. Cognition and memory are normal.  Nursing note and vitals reviewed.   Assessment   1. HTN/HLD uncontrolled  2. Left hip and low back pain  3. HM Plan   1. telmisartan 40 mg add back norvasc 2.5 mg qd  Repeat CMET, CBC in future rec statin vs nonstatin statin preferred pt wants to wait for now  2. xrays left hip and low back  3.  Flu shot had today   Tdap 05/29/17  Disc shingrix vaccine has not had yet  Never smoker  Check MMR  Colonoscopy 04/18/16 IH, diverticulosis f/u in 5 years   Pap had 04/03/18  mammo  Due 05/22/2018 referred today  Consider DEXA age 87  F/u Dr. Melvern Sample saw 10/08/17       Provider: Dr. Olivia Mackie McLean-Scocuzza-Internal Medicine

## 2018-02-12 NOTE — Patient Instructions (Addendum)
Call and sch nurse visit for shingrix vaccine x 2 doses  Add back norvasc 2.5 mg with Telmisartan 40 mg daily  Think about cholesterol medication statin vs nonstatin zetia    Hypertension Hypertension, commonly called high blood pressure, is when the force of blood pumping through the arteries is too strong. The arteries are the blood vessels that carry blood from the heart throughout the body. Hypertension forces the heart to work harder to pump blood and may cause arteries to become narrow or stiff. Having untreated or uncontrolled hypertension can cause heart attacks, strokes, kidney disease, and other problems. A blood pressure reading consists of a higher number over a lower number. Ideally, your blood pressure should be below 120/80. The first ("top") number is called the systolic pressure. It is a measure of the pressure in your arteries as your heart beats. The second ("bottom") number is called the diastolic pressure. It is a measure of the pressure in your arteries as the heart relaxes. What are the causes? The cause of this condition is not known. What increases the risk? Some risk factors for high blood pressure are under your control. Others are not. Factors you can change  Smoking.  Having type 2 diabetes mellitus, high cholesterol, or both.  Not getting enough exercise or physical activity.  Being overweight.  Having too much fat, sugar, calories, or salt (sodium) in your diet.  Drinking too much alcohol. Factors that are difficult or impossible to change  Having chronic kidney disease.  Having a family history of high blood pressure.  Age. Risk increases with age.  Race. You may be at higher risk if you are African-American.  Gender. Men are at higher risk than women before age 70. After age 61, women are at higher risk than men.  Having obstructive sleep apnea.  Stress. What are the signs or symptoms? Extremely high blood pressure (hypertensive crisis) may  cause:  Headache.  Anxiety.  Shortness of breath.  Nosebleed.  Nausea and vomiting.  Severe chest pain.  Jerky movements you cannot control (seizures).  How is this diagnosed? This condition is diagnosed by measuring your blood pressure while you are seated, with your arm resting on a surface. The cuff of the blood pressure monitor will be placed directly against the skin of your upper arm at the level of your heart. It should be measured at least twice using the same arm. Certain conditions can cause a difference in blood pressure between your right and left arms. Certain factors can cause blood pressure readings to be lower or higher than normal (elevated) for a short period of time:  When your blood pressure is higher when you are in a health care provider's office than when you are at home, this is called white coat hypertension. Most people with this condition do not need medicines.  When your blood pressure is higher at home than when you are in a health care provider's office, this is called masked hypertension. Most people with this condition may need medicines to control blood pressure.  If you have a high blood pressure reading during one visit or you have normal blood pressure with other risk factors:  You may be asked to return on a different day to have your blood pressure checked again.  You may be asked to monitor your blood pressure at home for 1 week or longer.  If you are diagnosed with hypertension, you may have other blood or imaging tests to help your health care  provider understand your overall risk for other conditions. How is this treated? This condition is treated by making healthy lifestyle changes, such as eating healthy foods, exercising more, and reducing your alcohol intake. Your health care provider may prescribe medicine if lifestyle changes are not enough to get your blood pressure under control, and if:  Your systolic blood pressure is above  130.  Your diastolic blood pressure is above 80.  Your personal target blood pressure may vary depending on your medical conditions, your age, and other factors. Follow these instructions at home: Eating and drinking  Eat a diet that is high in fiber and potassium, and low in sodium, added sugar, and fat. An example eating plan is called the DASH (Dietary Approaches to Stop Hypertension) diet. To eat this way: ? Eat plenty of fresh fruits and vegetables. Try to fill half of your plate at each meal with fruits and vegetables. ? Eat whole grains, such as whole wheat pasta, brown rice, or whole grain bread. Fill about one quarter of your plate with whole grains. ? Eat or drink low-fat dairy products, such as skim milk or low-fat yogurt. ? Avoid fatty cuts of meat, processed or cured meats, and poultry with skin. Fill about one quarter of your plate with lean proteins, such as fish, chicken without skin, beans, eggs, and tofu. ? Avoid premade and processed foods. These tend to be higher in sodium, added sugar, and fat.  Reduce your daily sodium intake. Most people with hypertension should eat less than 1,500 mg of sodium a day.  Limit alcohol intake to no more than 1 drink a day for nonpregnant women and 2 drinks a day for men. One drink equals 12 oz of beer, 5 oz of wine, or 1 oz of hard liquor. Lifestyle  Work with your health care provider to maintain a healthy body weight or to lose weight. Ask what an ideal weight is for you.  Get at least 30 minutes of exercise that causes your heart to beat faster (aerobic exercise) most days of the week. Activities may include walking, swimming, or biking.  Include exercise to strengthen your muscles (resistance exercise), such as pilates or lifting weights, as part of your weekly exercise routine. Try to do these types of exercises for 30 minutes at least 3 days a week.  Do not use any products that contain nicotine or tobacco, such as cigarettes and  e-cigarettes. If you need help quitting, ask your health care provider.  Monitor your blood pressure at home as told by your health care provider.  Keep all follow-up visits as told by your health care provider. This is important. Medicines  Take over-the-counter and prescription medicines only as told by your health care provider. Follow directions carefully. Blood pressure medicines must be taken as prescribed.  Do not skip doses of blood pressure medicine. Doing this puts you at risk for problems and can make the medicine less effective.  Ask your health care provider about side effects or reactions to medicines that you should watch for. Contact a health care provider if:  You think you are having a reaction to a medicine you are taking.  You have headaches that keep coming back (recurring).  You feel dizzy.  You have swelling in your ankles.  You have trouble with your vision. Get help right away if:  You develop a severe headache or confusion.  You have unusual weakness or numbness.  You feel faint.  You have severe pain in  your chest or abdomen.  You vomit repeatedly.  You have trouble breathing. Summary  Hypertension is when the force of blood pumping through your arteries is too strong. If this condition is not controlled, it may put you at risk for serious complications.  Your personal target blood pressure may vary depending on your medical conditions, your age, and other factors. For most people, a normal blood pressure is less than 120/80.  Hypertension is treated with lifestyle changes, medicines, or a combination of both. Lifestyle changes include weight loss, eating a healthy, low-sodium diet, exercising more, and limiting alcohol. This information is not intended to replace advice given to you by your health care provider. Make sure you discuss any questions you have with your health care provider. Document Released: 04/21/2005 Document Revised: 03/19/2016  Document Reviewed: 03/19/2016 Elsevier Interactive Patient Education  [employer].    Results for SVEA, PUSCH (MRN [id number]) as of 02/12/2018 08:09  Ref. Range 10/02/2017 08:22 02/05/2018 08:02  Sodium Latest Ref Range: 135 - 145 mEq/L 141 143  Potassium Latest Ref Range: 3.5 - 5.1 mEq/L 4.3 4.3  Chloride Latest Ref Range: 96 - 112 mEq/L 106 107  CO2 Latest Ref Range: 19 - 32 mEq/L 26 29  Glucose Latest Ref Range: 70 - 99 mg/dL 103 (H) 90  BUN Latest Ref Range: 6 - 23 mg/dL 18 26 (H)  Creatinine Latest Ref Range: 0.40 - 1.20 mg/dL 1.00 1.06  Calcium Latest Ref Range: 8.4 - 10.5 mg/dL 9.8 9.6   Results for TIARAH, SHISLER (MRN [id number]) as of 02/12/2018 08:09  Ref. Range 02/05/2018 08:02  Cholesterol Latest Ref Range: 0 - 200 mg/dL 203 (H)  HDL Cholesterol Latest Ref Range: >39.00 mg/dL 74.20  LDL (calc) Latest Ref Range: 0 - 99 mg/dL 118 (H)  NonHDL Unknown 128.95  Triglycerides Latest Ref Range: 0.0 - 149.0 mg/dL 53.0  VLDL Latest Ref Range: 0.0 - 40.0 mg/dL 10.6   Zetia/ Ezetimibe Tablets What is this medicine? EZETIMIBE (ez ET i mibe) blocks the absorption of cholesterol from the stomach. It can help lower blood cholesterol for patients who are at risk of getting heart disease or a stroke. It is only for patients whose cholesterol level is not controlled by diet. This medicine may be used for other purposes; ask your health care provider or pharmacist if you have questions. COMMON BRAND NAME(S): Zetia What should I tell my health care provider before I take this medicine? They need to know if you have any of these conditions: -liver disease -an unusual or allergic reaction to ezetimibe, medicines, foods, dyes, or preservatives -pregnant or trying to get pregnant -breast-feeding How should I use this medicine? Take this medicine by mouth with a glass of water. Follow the directions on the prescription label. This medicine can be taken with or without food. Take your  doses at regular intervals. Do not take your medicine more often than directed. Talk to your pediatrician regarding the use of this medicine in children. Special care may be needed. Overdosage: If you think you have taken too much of this medicine contact a poison control center or emergency room at once. NOTE: This medicine is only for you. Do not share this medicine with others. What if I miss a dose? If you miss a dose, take it as soon as you can. If it is almost time for your next dose, take only that dose. Do not take double or extra doses. What may interact with this medicine?  Do not take this medicine with any of the following medications: -fenofibrate -gemfibrozil This medicine may also interact with the following medications: -antacids -cyclosporine -herbal medicines like red yeast rice -other medicines to lower cholesterol or triglycerides This list may not describe all possible interactions. Give your health care provider a list of all the medicines, herbs, non-prescription drugs, or dietary supplements you use. Also tell them if you smoke, drink alcohol, or use illegal drugs. Some items may interact with your medicine. What should I watch for while using this medicine? Visit your doctor or health care professional for regular checks on your progress. You will need to have your cholesterol levels checked. If you are also taking some other cholesterol medicines, you will also need to have tests to make sure your liver is working properly. Tell your doctor or health care professional if you get any unexplained muscle pain, tenderness, or weakness, especially if you also have a fever and tiredness. You need to follow a low-cholesterol, low-fat diet while you are taking this medicine. This will decrease your risk of getting heart and blood vessel disease. Exercising and avoiding alcohol and smoking can also help. Ask your doctor or dietician for advice. What side effects may I notice from  receiving this medicine? Side effects that you should report to your doctor or health care professional as soon as possible: -allergic reactions like skin rash, itching or hives, swelling of the face, lips, or tongue -dark yellow or brown urine -unusually weak or tired -yellowing of the skin or eyes Side effects that usually do not require medical attention (report to your doctor or health care professional if they continue or are bothersome): -diarrhea -dizziness -headache -stomach upset or pain This list may not describe all possible side effects. Call your doctor for medical advice about side effects. You may report side effects to FDA at 1-800-FDA-1088. Where should I keep my medicine? Keep out of the reach of children. Store at room temperature between 15 and 30 degrees C (59 and 86 degrees F). Protect from moisture. Keep container tightly closed. Throw away any unused medicine after the expiration date. NOTE: This sheet is a summary. It may not cover all possible information. If you have questions about this medicine, talk to your doctor, pharmacist, or health care provider.  2018 Elsevier/Gold Standard (2011-10-27 15:39:09)  Recombinant Zoster (Shingles) Vaccine, RZV: What You Need to Know 1. Why get vaccinated? Shingles (also called herpes zoster, or just zoster) is a painful skin rash, often with blisters. Shingles is caused by the varicella zoster virus, the same virus that causes chickenpox. After you have chickenpox, the virus stays in your body and can cause shingles later in life. You can't catch shingles from another person. However, a person who has never had chickenpox (or chickenpox vaccine) could get chickenpox from someone with shingles. A shingles rash usually appears on one side of the face or body and heals within 2 to 4 weeks. Its main symptom is pain, which can be severe. Other symptoms can include fever, headache, chills and upset stomach. Very rarely, a shingles  infection can lead to pneumonia, hearing problems, blindness, brain inflammation (encephalitis), or death. For about 1 person in 5, severe pain can continue even long after the rash has cleared up. This long-lasting pain is called post-herpetic neuralgia (PHN). Shingles is far more common in people 47 years of age and older than in younger people, and the risk increases with age. It is also more common in people  whose immune system is weakened because of a disease such as cancer, or by drugs such as steroids or chemotherapy. At least 1 million people a year in the [place] get shingles. 2. Shingles vaccine (recombinant) Recombinant shingles vaccine was approved by FDA in 2017 for the prevention of shingles. In clinical trials, it was more than 90% effective in preventing shingles. It can also reduce the likelihood of PHN. Two doses, 2 to 6 months apart, are recommended for adults 71 and older. This vaccine is also recommended for people who have already gotten the live shingles vaccine (Zostavax). There is no live virus in this vaccine. 3. Some people should not get this vaccine Tell your vaccine provider if you:  Have any severe, life-threatening allergies. A person who has ever had a life-threatening allergic reaction after a dose of recombinant shingles vaccine, or has a severe allergy to any component of this vaccine, may be advised not to be vaccinated. Ask your health care provider if you want information about vaccine components.  Are pregnant or breastfeeding. There is not much information about use of recombinant shingles vaccine in pregnant or nursing women. Your healthcare provider might recommend delaying vaccination.  Are not feeling well. If you have a mild illness, such as a cold, you can probably get the vaccine today. If you are moderately or severely ill, you should probably wait until you recover. Your doctor can advise you.  4. Risks of a vaccine reaction With any medicine,  including vaccines, there is a chance of reactions. After recombinant shingles vaccination, a person might experience:  Pain, redness, soreness, or swelling at the site of the injection  Headache, muscle aches, fever, shivering, fatigue  In clinical trials, most people got a sore arm with mild or moderate pain after vaccination, and some also had redness and swelling where they got the shot. Some people felt tired, had muscle pain, a headache, shivering, fever, stomach pain, or nausea. About 1 out of 6 people who got recombinant zoster vaccine experienced side effects that prevented them from doing regular activities. Symptoms went away on their own in about 2 to 3 days. Side effects were more common in younger people. You should still get the second dose of recombinant zoster vaccine even if you had one of these reactions after the first dose. Other things that could happen after this vaccine:  People sometimes faint after medical procedures, including vaccination. Sitting or lying down for about 15 minutes can help prevent fainting and injuries caused by a fall. Tell your provider if you feel dizzy or have vision changes or ringing in the ears.  Some people get shoulder pain that can be more severe and longer-lasting than routine soreness that can follow injections. This happens very rarely.  Any medication can cause a severe allergic reaction. Such reactions to a vaccine are estimated at about 1 in a million doses, and would happen within a few minutes to a few hours after the vaccination. As with any medicine, there is a very remote chance of a vaccine causing a serious injury or death. The safety of vaccines is always being monitored. For more information, visit: http://www.aguilar.org/ 5. What if there is a serious problem? What should I look for?  Look for anything that concerns you, such as signs of a severe allergic reaction, very high fever, or unusual behavior. Signs of a severe  allergic reaction can include hives, swelling of the face and throat, difficulty breathing, a fast heartbeat, dizziness, and  weakness. These would usually start a few minutes to a few hours after the vaccination. What should I do?  If you think it is a severe allergic reaction or other emergency that can't wait, call 9-1-1 and get to the nearest hospital. Otherwise, call your health care provider. Afterward, the reaction should be reported to the Vaccine Adverse Event Reporting System (VAERS). Your doctor should file this report, or you can do it yourself through the VAERS web site atwww.vaers.https://www.bray.com/ by calling 564 485 2825. VAERS does not give medical advice. 6. How can I learn more?  Ask your healthcare provider. He or she can give you the vaccine package insert or suggest other sources of information.  Call your local or state health department.  Contact the Centers for Disease Control and Prevention (CDC): ? Call 978-593-4881 (1-800-CDC-INFO) or ? Visit the CDC's website at http://hunter.com/ CDC Vaccine Information Statement (VIS) Recombinant Zoster Vaccine (06/16/2016) This information is not intended to replace advice given to you by your health care provider. Make sure you discuss any questions you have with your health care provider. Document Released: 07/01/2016 Document Revised: 07/01/2016 Document Reviewed: 07/01/2016 Elsevier Interactive Patient Education  [employer].

## 2018-02-26 ENCOUNTER — Other Ambulatory Visit (INDEPENDENT_AMBULATORY_CARE_PROVIDER_SITE_OTHER): Payer: BLUE CROSS/BLUE SHIELD

## 2018-02-26 ENCOUNTER — Ambulatory Visit (INDEPENDENT_AMBULATORY_CARE_PROVIDER_SITE_OTHER): Payer: BLUE CROSS/BLUE SHIELD

## 2018-02-26 DIAGNOSIS — I10 Essential (primary) hypertension: Secondary | ICD-10-CM

## 2018-02-26 DIAGNOSIS — M545 Low back pain: Secondary | ICD-10-CM | POA: Diagnosis not present

## 2018-02-26 DIAGNOSIS — G8929 Other chronic pain: Secondary | ICD-10-CM

## 2018-02-26 DIAGNOSIS — M25552 Pain in left hip: Secondary | ICD-10-CM

## 2018-02-26 NOTE — Addendum Note (Signed)
Addended by: Arby Barrette on: 02/26/2018 08:55 AM   Modules accepted: Orders

## 2018-02-26 NOTE — Addendum Note (Signed)
Addended by: Arby Barrette on: 02/26/2018 09:07 AM   Modules accepted: Orders

## 2018-03-02 ENCOUNTER — Other Ambulatory Visit (INDEPENDENT_AMBULATORY_CARE_PROVIDER_SITE_OTHER): Payer: BLUE CROSS/BLUE SHIELD

## 2018-03-02 DIAGNOSIS — Z0184 Encounter for antibody response examination: Secondary | ICD-10-CM | POA: Diagnosis not present

## 2018-03-02 DIAGNOSIS — Z1159 Encounter for screening for other viral diseases: Secondary | ICD-10-CM

## 2018-03-02 DIAGNOSIS — I10 Essential (primary) hypertension: Secondary | ICD-10-CM

## 2018-03-02 NOTE — Addendum Note (Signed)
Addended by: Arby Barrette on: 03/02/2018 08:26 AM   Modules accepted: Orders

## 2018-03-03 ENCOUNTER — Other Ambulatory Visit: Payer: Self-pay | Admitting: Internal Medicine

## 2018-03-03 DIAGNOSIS — R7989 Other specified abnormal findings of blood chemistry: Secondary | ICD-10-CM

## 2018-03-03 DIAGNOSIS — D649 Anemia, unspecified: Secondary | ICD-10-CM

## 2018-03-03 LAB — COMPREHENSIVE METABOLIC PANEL
AG Ratio: 1.7 (calc) (ref 1.0–2.5)
ALT: 11 U/L (ref 6–29)
AST: 23 U/L (ref 10–35)
Albumin: 4 g/dL (ref 3.6–5.1)
Alkaline phosphatase (APISO): 22 U/L — ABNORMAL LOW (ref 33–130)
BUN/Creatinine Ratio: 15 (calc) (ref 6–22)
BUN: 17 mg/dL (ref 7–25)
CO2: 24 mmol/L (ref 20–32)
Calcium: 9.7 mg/dL (ref 8.6–10.4)
Chloride: 105 mmol/L (ref 98–110)
Creat: 1.15 mg/dL — ABNORMAL HIGH (ref 0.50–0.99)
Globulin: 2.4 g/dL (calc) (ref 1.9–3.7)
Glucose, Bld: 94 mg/dL (ref 65–99)
Potassium: 4 mmol/L (ref 3.5–5.3)
Sodium: 141 mmol/L (ref 135–146)
Total Bilirubin: 0.6 mg/dL (ref 0.2–1.2)
Total Protein: 6.4 g/dL (ref 6.1–8.1)

## 2018-03-03 LAB — CBC WITH DIFFERENTIAL/PLATELET
Basophils Absolute: 41 cells/uL (ref 0–200)
Basophils Relative: 0.9 %
Eosinophils Absolute: 122 cells/uL (ref 15–500)
Eosinophils Relative: 2.7 %
HCT: 32.9 % — ABNORMAL LOW (ref 35.0–45.0)
Hemoglobin: 10.9 g/dL — ABNORMAL LOW (ref 11.7–15.5)
Lymphs Abs: 1283 cells/uL (ref 850–3900)
MCH: 30.3 pg (ref 27.0–33.0)
MCHC: 33.1 g/dL (ref 32.0–36.0)
MCV: 91.4 fL (ref 80.0–100.0)
MPV: 13 fL — ABNORMAL HIGH (ref 7.5–12.5)
Monocytes Relative: 8.8 %
Neutro Abs: 2660 cells/uL (ref 1500–7800)
Neutrophils Relative %: 59.1 %
Platelets: 173 10*3/uL (ref 140–400)
RBC: 3.6 10*6/uL — ABNORMAL LOW (ref 3.80–5.10)
RDW: 12 % (ref 11.0–15.0)
Total Lymphocyte: 28.5 %
WBC mixed population: 396 cells/uL (ref 200–950)
WBC: 4.5 10*3/uL (ref 3.8–10.8)

## 2018-03-03 LAB — MEASLES/MUMPS/RUBELLA IMMUNITY
Mumps IgG: >300 AU/mL
Rubella: 21 index
Rubeola IgG: >300 AU/mL

## 2018-03-04 ENCOUNTER — Other Ambulatory Visit: Payer: Self-pay | Admitting: Internal Medicine

## 2018-03-04 DIAGNOSIS — E559 Vitamin D deficiency, unspecified: Secondary | ICD-10-CM

## 2018-03-17 ENCOUNTER — Ambulatory Visit: Payer: BLUE CROSS/BLUE SHIELD

## 2018-03-17 ENCOUNTER — Ambulatory Visit (INDEPENDENT_AMBULATORY_CARE_PROVIDER_SITE_OTHER): Payer: BLUE CROSS/BLUE SHIELD | Admitting: Internal Medicine

## 2018-03-17 ENCOUNTER — Encounter: Payer: Self-pay | Admitting: Internal Medicine

## 2018-03-17 VITALS — BP 132/88 | HR 62 | Temp 97.5°F | Ht 65.0 in | Wt 167.2 lb

## 2018-03-17 DIAGNOSIS — I10 Essential (primary) hypertension: Secondary | ICD-10-CM

## 2018-03-17 DIAGNOSIS — Z23 Encounter for immunization: Secondary | ICD-10-CM | POA: Diagnosis not present

## 2018-03-17 DIAGNOSIS — R7989 Other specified abnormal findings of blood chemistry: Secondary | ICD-10-CM | POA: Diagnosis not present

## 2018-03-17 DIAGNOSIS — D509 Iron deficiency anemia, unspecified: Secondary | ICD-10-CM | POA: Diagnosis not present

## 2018-03-17 DIAGNOSIS — D649 Anemia, unspecified: Secondary | ICD-10-CM

## 2018-03-17 DIAGNOSIS — E785 Hyperlipidemia, unspecified: Secondary | ICD-10-CM | POA: Diagnosis not present

## 2018-03-17 NOTE — Progress Notes (Signed)
Chief Complaint  Patient presents with  . Follow-up   F.u  1. HTN improved on micardis 40 mg qam and norvasc 2.5 mg qhs ha at night improved BP readings reduced  2. Elevated Creatinine and anemia drinking water ok  3. H/o iron def anemia x 10 years unable to tolerate iron otc ferrous sulfate due to constipation no cycles and no blood in stool   Review of Systems  Constitutional: Negative for weight loss.  HENT: Negative for hearing loss.   Eyes: Negative for blurred vision.  Respiratory: Negative for shortness of breath.   Cardiovascular: Negative for chest pain.  Gastrointestinal: Positive for constipation. Negative for blood in stool.  Genitourinary:       No menses   Skin: Negative for rash.  Neurological: Negative for headaches.  Psychiatric/Behavioral: Negative for depression.   Past Medical History:  Diagnosis Date  . Actinic keratoses   . Allergy   . Chicken pox   . Colon polyps   . Heart murmur    youth  . Hyperlipidemia   . Hypertension   . Obesity   . Urgency incontinence    Past Surgical History:  Procedure Laterality Date  . BUNIONECTOMY Bilateral   . COLONOSCOPY    . COLONOSCOPY WITH PROPOFOL N/A 04/18/2016   Procedure: COLONOSCOPY WITH PROPOFOL;  Surgeon: Robert T Elliott, MD;  Location: [facility] ENDOSCOPY;  Service: Endoscopy;  Laterality: N/A;  . TONSILLECTOMY AND ADENOIDECTOMY     Family History  Problem Relation Age of Onset  . Arthritis Mother   . Diabetes Mother   . Stroke Mother   . Arthritis Father   . Diabetes Father   . Early death Father   . Heart disease Father        MI 71 y.o   . Diabetes Sister    Social History   Socioeconomic History  . Marital status: Married    Spouse name: Not on file  . Number of children: Not on file  . Years of education: Not on file  . Highest education level: Not on file  Occupational History  . Not on file  Social Needs  . Financial resource strain: Not on file  . Food insecurity:    Worry: Not on  file    Inability: Not on file  . Transportation needs:    Medical: Not on file    Non-medical: Not on file  Tobacco Use  . Smoking status: Never Smoker  . Smokeless tobacco: Never Used  Substance and Sexual Activity  . Alcohol use: Not on file    Comment: occasionally wine   . Drug use: No  . Sexual activity: Never  Lifestyle  . Physical activity:    Days per week: Not on file    Minutes per session: Not on file  . Stress: Not on file  Relationships  . Social connections:    Talks on phone: Not on file    Gets together: Not on file    Attends religious service: Not on file    Active member of club or organization: Not on file    Attends meetings of clubs or organizations: Not on file    Relationship status: Not on file  . Intimate partner violence:    Fear of current or ex partner: Not on file    Emotionally abused: Not on file    Physically abused: Not on file    Forced sexual activity: Not on file  Other Topics Concern  . Not on   file  Social History Narrative   Married    1 daughter age 54 as of 03/2017    No outpatient medications have been marked as taking for the 03/17/18 encounter (Office Visit) with McLean-Scocuzza, Nino Glow, MD.   Allergies  Allergen Reactions  . Terfenadine Rash   Recent Results (from the past 2160 hour(s))  Basic Metabolic Panel (BMET)     Status: Abnormal   Collection Time: 02/05/18  8:02 AM  Result Value Ref Range   Sodium 143 135 - 145 mEq/L   Potassium 4.3 3.5 - 5.1 mEq/L   Chloride 107 96 - 112 mEq/L   CO2 29 19 - 32 mEq/L   Glucose, Bld 90 70 - 99 mg/dL   BUN 26 (H) 6 - 23 mg/dL   Creatinine, Ser 1.06 0.40 - 1.20 mg/dL   Calcium 9.6 8.4 - 10.5 mg/dL   GFR 55.54 (L) >60.00 mL/min  Hemoglobin A1c     Status: None   Collection Time: 02/05/18  8:02 AM  Result Value Ref Range   Hgb A1c MFr Bld 5.7 4.6 - 6.5 %    Comment: Glycemic Control Guidelines for People with Diabetes:Non Diabetic:  <6%Goal of Therapy: <7%Additional Action  Suggested:  >8%   Lipid panel     Status: Abnormal   Collection Time: 02/05/18  8:02 AM  Result Value Ref Range   Cholesterol 203 (H) 0 - 200 mg/dL    Comment: ATP III Classification       Desirable:  < 200 mg/dL               Borderline High:  200 - 239 mg/dL          High:  > = 240 mg/dL   Triglycerides 53.0 0.0 - 149.0 mg/dL    Comment: Normal:  <150 mg/dLBorderline High:  150 - 199 mg/dL   HDL 74.20 >39.00 mg/dL   VLDL 10.6 0.0 - 40.0 mg/dL   LDL Cholesterol 118 (H) 0 - 99 mg/dL   Total CHOL/HDL Ratio 3     Comment:                Men          Women1/2 Average Risk     3.4          3.3Average Risk          5.0          4.42X Average Risk          9.6          7.13X Average Risk          15.0          11.0                       NonHDL 128.95     Comment: NOTE:  Non-HDL goal should be 30 mg/dL higher than patient's LDL goal (i.e. LDL goal of < 70 mg/dL, would have non-HDL goal of < 100 mg/dL)  CBC with Differential/Platelet     Status: Abnormal   Collection Time: 03/02/18  8:26 AM  Result Value Ref Range   WBC 4.5 3.8 - 10.8 Thousand/uL   RBC 3.60 (L) 3.80 - 5.10 Million/uL   Hemoglobin 10.9 (L) 11.7 - 15.5 g/dL   HCT 32.9 (L) 35.0 - 45.0 %   MCV 91.4 80.0 - 100.0 fL   MCH 30.3 27.0 - 33.0 pg   MCHC 33.1 32.0 -  36.0 g/dL   RDW 12.0 11.0 - 15.0 %   Platelets 173 140 - 400 Thousand/uL   MPV 13.0 (H) 7.5 - 12.5 fL   Neutro Abs 2,660 1,500 - 7,800 cells/uL   Lymphs Abs 1,283 850 - 3,900 cells/uL   WBC mixed population 396 200 - 950 cells/uL   Eosinophils Absolute 122 15 - 500 cells/uL   Basophils Absolute 41 0 - 200 cells/uL   Neutrophils Relative % 59.1 %   Total Lymphocyte 28.5 %   Monocytes Relative 8.8 %   Eosinophils Relative 2.7 %   Basophils Relative 0.9 %  Comprehensive metabolic panel     Status: Abnormal   Collection Time: 03/02/18  8:26 AM  Result Value Ref Range   Glucose, Bld 94 65 - 99 mg/dL    Comment: .            Fasting reference interval .    BUN 17 7 -  25 mg/dL   Creat 1.15 (H) 0.50 - 0.99 mg/dL    Comment: For patients >74 years of age, the reference limit for Creatinine is approximately 13% higher for people identified as African-American. .    BUN/Creatinine Ratio 15 6 - 22 (calc)   Sodium 141 135 - 146 mmol/L   Potassium 4.0 3.5 - 5.3 mmol/L   Chloride 105 98 - 110 mmol/L   CO2 24 20 - 32 mmol/L   Calcium 9.7 8.6 - 10.4 mg/dL   Total Protein 6.4 6.1 - 8.1 g/dL   Albumin 4.0 3.6 - 5.1 g/dL   Globulin 2.4 1.9 - 3.7 g/dL (calc)   AG Ratio 1.7 1.0 - 2.5 (calc)   Total Bilirubin 0.6 0.2 - 1.2 mg/dL   Alkaline phosphatase (APISO) 22 (L) 33 - 130 U/L   AST 23 10 - 35 U/L   ALT 11 6 - 29 U/L  Measles/Mumps/Rubella Immunity     Status: None   Collection Time: 03/02/18  8:26 AM  Result Value Ref Range   Rubeola IgG >300.00 AU/mL    Comment: AU/mL            Interpretation -----            -------------- <25.00           Negative 25.00-29.99      Equivocal >29.99           Positive . A positive result indicates that the patient has antibody to measles virus. It does not differentiate  between an active or past infection. The clinical  diagnosis must be interpreted in conjunction with  clinical signs and symptoms of the patient.    Mumps IgG >300.00 AU/mL    Comment:  AU/mL           Interpretation -------         ---------------- <9.00             Negative 9.00-10.99        Equivocal >10.99            Positive A positive result indicates that the patient has  antibody to mumps virus. It does not differentiate between an  active or past infection. The clinical diagnosis must be interpreted in conjunction with clinical signs and symptoms of the patient. .    Rubella 21.00 index    Comment:     Index            Interpretation     -----            --------------       <  0.90            Not consistent with Immunity     0.90-0.99        Equivocal     > or = 1.00      Consistent with Immunity  . The presence of rubella IgG  antibody suggests  immunization or past or current infection with rubella virus.    Objective  Body mass index is 27.82 kg/m. Wt Readings from Last 3 Encounters:  03/17/18 167 lb 3.2 oz (75.8 kg)  02/12/18 169 lb 4 oz (76.8 kg)  10/09/17 164 lb 12.8 oz (74.8 kg)   Temp Readings from Last 3 Encounters:  03/17/18 (!) 97.5 F (36.4 C) (Oral)  02/12/18 97.8 F (36.6 C) (Oral)  10/09/17 98.1 F (36.7 C) (Oral)   BP Readings from Last 3 Encounters:  03/17/18 132/88  02/12/18 (!) 168/80  10/09/17 (!) 146/80   Pulse Readings from Last 3 Encounters:  03/17/18 62  02/12/18 (!) 51  10/09/17 (!) 57    Physical Exam  Constitutional: She is oriented to person, place, and time. Vital signs are normal. She appears well-developed and well-nourished. She is cooperative.  HENT:  Head: Normocephalic and atraumatic.  Mouth/Throat: Oropharynx is clear and moist and mucous membranes are normal.  Eyes: Pupils are equal, round, and reactive to light. Conjunctivae are normal.  Cardiovascular: Normal rate, regular rhythm and normal heart sounds.  Pulmonary/Chest: Effort normal and breath sounds normal. She has no wheezes.  Neurological: She is alert and oriented to person, place, and time. Gait normal.  Skin: Skin is warm, dry and intact.  Psychiatric: She has a normal mood and affect. Her speech is normal and behavior is normal. Judgment and thought content normal. Cognition and memory are normal.  Nursing note and vitals reviewed.   Assessment   1. HTN improved and hld 2. Elevated Creatinine and anemia  3. Iron def anemia ? Etiology  4. HM Plan   1. Cont meds micardis 40 qam, norvasc 2.5 qhs  Consider statin in future  2. W/u SPEP, UPEP 3. Check iron def anemia consider EGD 4.  Flu shot utd Tdap1/25/19 shingrix 1/2 today Never smoker  immune MMR  Colonoscopy 04/18/16 IH, diverticulosis f/u in 5 years   Paphad 04/03/18 mammo  Due 05/22/2018 referred today  Consider DEXA  age 67  F/u Dr. Melvern Sample saw 10/08/17  Provider: Dr. Olivia Mackie McLean-Scocuzza-[facility]

## 2018-03-17 NOTE — Patient Instructions (Addendum)
Chewable bonine or dramamine motion sickness safe travels  I am glad your blood pressure is better ! Happy Holidays  Try Ferrous gluconate otc iron 1x per day   Call to sch mammo due 05/22/2018   Iron Deficiency Anemia, Adult Iron deficiency anemia is a condition in which the concentration of red blood cells or hemoglobin in the blood is below normal because of too little iron. Hemoglobin is a substance in red blood cells that carries oxygen to the body's tissues. When the concentration of red blood cells or hemoglobin is too low, not enough oxygen reaches these tissues. Iron deficiency anemia is usually long-lasting (chronic) and it develops over time. It may or may not cause symptoms. It is a common type of anemia. What are the causes? This condition may be caused by:  Not enough iron in the diet.  Blood loss caused by bleeding in the intestine.  Blood loss from a gastrointestinal condition like Crohn disease.  Frequent blood draws, such as from blood donation.  Abnormal absorption in the gut.  Heavy menstrual periods in women.  Cancers of the gastrointestinal system, such as colon cancer.  What are the signs or symptoms? Symptoms of this condition may include:  Fatigue.  Headache.  Pale skin, lips, and nail beds.  Poor appetite.  Weakness.  Shortness of breath.  Dizziness.  Cold hands and feet.  Fast or irregular heartbeat.  Irritability. This is more common in severe anemia.  Rapid breathing. This is more common in severe anemia.  Mild anemia may not cause any symptoms. How is this diagnosed? This condition is diagnosed based on:  Your medical history.  A physical exam.  Blood tests.  You may have additional tests to find the underlying cause of your anemia, such as:  Testing for blood in the stool (fecal occult blood test).  A procedure to see inside your colon and rectum (colonoscopy).  A procedure to see inside your esophagus and stomach  (endoscopy).  A test in which cells are removed from bone marrow (bone marrow aspiration) or fluid is removed from the bone marrow to be examined (biopsy). This is rarely needed.  How is this treated? This condition is treated by correcting the cause of your iron deficiency. Treatment may involve:  Adding iron-rich foods to your diet.  Taking iron supplements. If you are pregnant or breastfeeding, you may need to take extra iron because your normal diet usually does not provide the amount of iron that you need.  Increasing vitamin C intake. Vitamin C helps your body absorb iron. Your health care provider may recommend that you take iron supplements along with a glass of orange juice or a vitamin C supplement.  Medicines to make heavy menstrual flow lighter.  Surgery.  You may need repeat blood tests to determine whether treatment is working. Depending on the underlying cause, the anemia should be corrected within 2 months of starting treatment. If the treatment does not seem to be working, you may need more testing. Follow these instructions at home: Medicines  Take over-the-counter and prescription medicines only as told by your health care provider. This includes iron supplements and vitamins.  If you cannot tolerate taking iron supplements by mouth, talk with your health care provider about taking them through a vein (intravenously) or an injection into a muscle.  For the best iron absorption, you should take iron supplements when your stomach is empty. If you cannot tolerate them on an empty stomach, you may need to  take them with food.  Do not drink milk or take antacids at the same time as your iron supplements. Milk and antacids may interfere with iron absorption.  Iron supplements can cause constipation. To prevent constipation, include fiber in your diet as told by your health care provider. A stool softener may also be recommended. Eating and drinking  Talk with your health  care provider before changing your diet. He or she may recommend that you eat foods that contain a lot of iron, such as: ? Liver. ? Low-fat (lean) beef. ? Breads and cereals that have iron added to them (are fortified). ? Eggs. ? Dried fruit. ? Dark green, leafy vegetables.  To help your body use the iron from iron-rich foods, eat those foods at the same time as fresh fruits and vegetables that are high in vitamin C. Foods that are high in vitamin C include: ? Oranges. ? Peppers. ? Tomatoes. ? Mangoes.  Drinkenoughfluid to keep your urine clear or pale yellow. General instructions  Return to your normal activities as told by your health care provider. Ask your health care provider what activities are safe for you.  Practice good hygiene. Anemia can make you more prone to illness and infection.  Keep all follow-up visits as told by your health care provider. This is important. Contact a health care provider if:  You feel nauseous or you vomit.  You feel weak.  You have unexplained sweating.  You develop symptoms of constipation, such as: ? Having fewer than three bowel movements a week. ? Straining to have a bowel movement. ? Having stools that are hard, dry, or larger than normal. ? Feeling full or bloated. ? Pain in the lower abdomen. ? Not feeling relief after having a bowel movement. Get help right away if:  You faint. If this happens, do not drive yourself to the hospital. Call your local emergency services (911 in the [place]  You have chest pain.  You have shortness of breath that: ? Is severe. ? Gets worse with physical activity.  You have a rapid heartbeat.  You become light-headed when getting up from a sitting or lying down position. This information is not intended to replace advice given to you by your health care provider. Make sure you discuss any questions you have with your health care provider. Document Released: 04/18/2000 Document Revised:  01/09/2016 Document Reviewed: 01/09/2016 Elsevier Interactive Patient Education  2018 [place] Zoster (Shingles) Vaccine, RZV: What You Need to Know 1. Why get vaccinated? Shingles (also called herpes zoster, or just zoster) is a painful skin rash, often with blisters. Shingles is caused by the varicella zoster virus, the same virus that causes chickenpox. After you have chickenpox, the virus stays in your body and can cause shingles later in life. You can't catch shingles from another person. However, a person who has never had chickenpox (or chickenpox vaccine) could get chickenpox from someone with shingles. A shingles rash usually appears on one side of the face or body and heals within 2 to 4 weeks. Its main symptom is pain, which can be severe. Other symptoms can include fever, headache, chills and upset stomach. Very rarely, a shingles infection can lead to pneumonia, hearing problems, blindness, brain inflammation (encephalitis), or death. For about 1 person in 5, severe pain can continue even long after the rash has cleared up. This long-lasting pain is called post-herpetic neuralgia (PHN). Shingles is far more common in people 57 years of age  and older than in younger people, and the risk increases with age. It is also more common in people whose immune system is weakened because of a disease such as cancer, or by drugs such as steroids or chemotherapy. At least 1 million people a year in the [place] get shingles. 2. Shingles vaccine (recombinant) Recombinant shingles vaccine was approved by FDA in 2017 for the prevention of shingles. In clinical trials, it was more than 90% effective in preventing shingles. It can also reduce the likelihood of PHN. Two doses, 2 to 6 months apart, are recommended for adults 75 and older. This vaccine is also recommended for people who have already gotten the live shingles vaccine (Zostavax). There is no live virus in this  vaccine. 3. Some people should not get this vaccine Tell your vaccine provider if you:  Have any severe, life-threatening allergies. A person who has ever had a life-threatening allergic reaction after a dose of recombinant shingles vaccine, or has a severe allergy to any component of this vaccine, may be advised not to be vaccinated. Ask your health care provider if you want information about vaccine components.  Are pregnant or breastfeeding. There is not much information about use of recombinant shingles vaccine in pregnant or nursing women. Your healthcare provider might recommend delaying vaccination.  Are not feeling well. If you have a mild illness, such as a cold, you can probably get the vaccine today. If you are moderately or severely ill, you should probably wait until you recover. Your doctor can advise you.  4. Risks of a vaccine reaction With any medicine, including vaccines, there is a chance of reactions. After recombinant shingles vaccination, a person might experience:  Pain, redness, soreness, or swelling at the site of the injection  Headache, muscle aches, fever, shivering, fatigue  In clinical trials, most people got a sore arm with mild or moderate pain after vaccination, and some also had redness and swelling where they got the shot. Some people felt tired, had muscle pain, a headache, shivering, fever, stomach pain, or nausea. About 1 out of 6 people who got recombinant zoster vaccine experienced side effects that prevented them from doing regular activities. Symptoms went away on their own in about 2 to 3 days. Side effects were more common in younger people. You should still get the second dose of recombinant zoster vaccine even if you had one of these reactions after the first dose. Other things that could happen after this vaccine:  People sometimes faint after medical procedures, including vaccination. Sitting or lying down for about 15 minutes can help prevent  fainting and injuries caused by a fall. Tell your provider if you feel dizzy or have vision changes or ringing in the ears.  Some people get shoulder pain that can be more severe and longer-lasting than routine soreness that can follow injections. This happens very rarely.  Any medication can cause a severe allergic reaction. Such reactions to a vaccine are estimated at about 1 in a million doses, and would happen within a few minutes to a few hours after the vaccination. As with any medicine, there is a very remote chance of a vaccine causing a serious injury or death. The safety of vaccines is always being monitored. For more information, visit: http://www.aguilar.org/ 5. What if there is a serious problem? What should I look for?  Look for anything that concerns you, such as signs of a severe allergic reaction, very high fever, or unusual behavior. Signs of a  severe allergic reaction can include hives, swelling of the face and throat, difficulty breathing, a fast heartbeat, dizziness, and weakness. These would usually start a few minutes to a few hours after the vaccination. What should I do?  If you think it is a severe allergic reaction or other emergency that can't wait, call 9-1-1 and get to the nearest hospital. Otherwise, call your health care provider. Afterward, the reaction should be reported to the Vaccine Adverse Event Reporting System (VAERS). Your doctor should file this report, or you can do it yourself through the VAERS web site atwww.vaers.https://www.bray.com/ by calling 519-738-1000. VAERS does not give medical advice. 6. How can I learn more?  Ask your healthcare provider. He or she can give you the vaccine package insert or suggest other sources of information.  Call your local or state health department.  Contact the Centers for Disease Control and Prevention (CDC): ? Call (508) 464-5732 (1-800-CDC-INFO) or ? Visit the CDC's website at http://hunter.com/ CDC Vaccine  Information Statement (VIS) Recombinant Zoster Vaccine (06/16/2016) This information is not intended to replace advice given to you by your health care provider. Make sure you discuss any questions you have with your health care provider. Document Released: 07/01/2016 Document Revised: 07/01/2016 Document Reviewed: 07/01/2016 Elsevier Interactive Patient Education  2018 [employer].  Iron Deficiency Anemia, Adult Iron deficiency anemia is a condition in which the concentration of red blood cells or hemoglobin in the blood is below normal because of too little iron. Hemoglobin is a substance in red blood cells that carries oxygen to the body's tissues. When the concentration of red blood cells or hemoglobin is too low, not enough oxygen reaches these tissues. Iron deficiency anemia is usually long-lasting (chronic) and it develops over time. It may or may not cause symptoms. It is a common type of anemia. What are the causes? This condition may be caused by:  Not enough iron in the diet.  Blood loss caused by bleeding in the intestine.  Blood loss from a gastrointestinal condition like Crohn disease.  Frequent blood draws, such as from blood donation.  Abnormal absorption in the gut.  Heavy menstrual periods in women.  Cancers of the gastrointestinal system, such as colon cancer.  What are the signs or symptoms? Symptoms of this condition may include:  Fatigue.  Headache.  Pale skin, lips, and nail beds.  Poor appetite.  Weakness.  Shortness of breath.  Dizziness.  Cold hands and feet.  Fast or irregular heartbeat.  Irritability. This is more common in severe anemia.  Rapid breathing. This is more common in severe anemia.  Mild anemia may not cause any symptoms. How is this diagnosed? This condition is diagnosed based on:  Your medical history.  A physical exam.  Blood tests.  You may have additional tests to find the underlying cause of your anemia, such  as:  Testing for blood in the stool (fecal occult blood test).  A procedure to see inside your colon and rectum (colonoscopy).  A procedure to see inside your esophagus and stomach (endoscopy).  A test in which cells are removed from bone marrow (bone marrow aspiration) or fluid is removed from the bone marrow to be examined (biopsy). This is rarely needed.  How is this treated? This condition is treated by correcting the cause of your iron deficiency. Treatment may involve:  Adding iron-rich foods to your diet.  Taking iron supplements. If you are pregnant or breastfeeding, you may need to take extra iron because your normal  diet usually does not provide the amount of iron that you need.  Increasing vitamin C intake. Vitamin C helps your body absorb iron. Your health care provider may recommend that you take iron supplements along with a glass of orange juice or a vitamin C supplement.  Medicines to make heavy menstrual flow lighter.  Surgery.  You may need repeat blood tests to determine whether treatment is working. Depending on the underlying cause, the anemia should be corrected within 2 months of starting treatment. If the treatment does not seem to be working, you may need more testing. Follow these instructions at home: Medicines  Take over-the-counter and prescription medicines only as told by your health care provider. This includes iron supplements and vitamins.  If you cannot tolerate taking iron supplements by mouth, talk with your health care provider about taking them through a vein (intravenously) or an injection into a muscle.  For the best iron absorption, you should take iron supplements when your stomach is empty. If you cannot tolerate them on an empty stomach, you may need to take them with food.  Do not drink milk or take antacids at the same time as your iron supplements. Milk and antacids may interfere with iron absorption.  Iron supplements can cause  constipation. To prevent constipation, include fiber in your diet as told by your health care provider. A stool softener may also be recommended. Eating and drinking  Talk with your health care provider before changing your diet. He or she may recommend that you eat foods that contain a lot of iron, such as: ? Liver. ? Low-fat (lean) beef. ? Breads and cereals that have iron added to them (are fortified). ? Eggs. ? Dried fruit. ? Dark green, leafy vegetables.  To help your body use the iron from iron-rich foods, eat those foods at the same time as fresh fruits and vegetables that are high in vitamin C. Foods that are high in vitamin C include: ? Oranges. ? Peppers. ? Tomatoes. ? Mangoes.  Drinkenoughfluid to keep your urine clear or pale yellow. General instructions  Return to your normal activities as told by your health care provider. Ask your health care provider what activities are safe for you.  Practice good hygiene. Anemia can make you more prone to illness and infection.  Keep all follow-up visits as told by your health care provider. This is important. Contact a health care provider if:  You feel nauseous or you vomit.  You feel weak.  You have unexplained sweating.  You develop symptoms of constipation, such as: ? Having fewer than three bowel movements a week. ? Straining to have a bowel movement. ? Having stools that are hard, dry, or larger than normal. ? Feeling full or bloated. ? Pain in the lower abdomen. ? Not feeling relief after having a bowel movement. Get help right away if:  You faint. If this happens, do not drive yourself to the hospital. Call your local emergency services (911 in the [place]  You have chest pain.  You have shortness of breath that: ? Is severe. ? Gets worse with physical activity.  You have a rapid heartbeat.  You become light-headed when getting up from a sitting or lying down position. This information is not intended  to replace advice given to you by your health care provider. Make sure you discuss any questions you have with your health care provider. Document Released: 04/18/2000 Document Revised: 01/09/2016 Document Reviewed: 01/09/2016 Elsevier Interactive Patient Education  2018  [employer].

## 2018-05-07 ENCOUNTER — Encounter: Payer: Self-pay | Admitting: Internal Medicine

## 2018-05-07 ENCOUNTER — Ambulatory Visit: Payer: BLUE CROSS/BLUE SHIELD | Admitting: Internal Medicine

## 2018-05-07 VITALS — BP 136/90 | HR 69 | Temp 98.2°F | Ht 65.0 in | Wt 168.6 lb

## 2018-05-07 DIAGNOSIS — R7989 Other specified abnormal findings of blood chemistry: Secondary | ICD-10-CM

## 2018-05-07 DIAGNOSIS — K649 Unspecified hemorrhoids: Secondary | ICD-10-CM | POA: Diagnosis not present

## 2018-05-07 DIAGNOSIS — J069 Acute upper respiratory infection, unspecified: Secondary | ICD-10-CM

## 2018-05-07 DIAGNOSIS — E785 Hyperlipidemia, unspecified: Secondary | ICD-10-CM

## 2018-05-07 DIAGNOSIS — Z1329 Encounter for screening for other suspected endocrine disorder: Secondary | ICD-10-CM

## 2018-05-07 DIAGNOSIS — I10 Essential (primary) hypertension: Secondary | ICD-10-CM

## 2018-05-07 DIAGNOSIS — D649 Anemia, unspecified: Secondary | ICD-10-CM | POA: Diagnosis not present

## 2018-05-07 DIAGNOSIS — E559 Vitamin D deficiency, unspecified: Secondary | ICD-10-CM

## 2018-05-07 MED ORDER — HYDROCORTISONE 2.5 % RE CREA: 1.0000 "application " | TOPICAL_CREAM | Freq: Two times a day (BID) | RECTAL | 11 refills | Status: DC

## 2018-05-07 MED ORDER — HYDROCOD POLST-CPM POLST ER 10-8 MG/5ML PO SUER: 5.0000 mL | Freq: Every evening | ORAL | 0 refills | Status: DC | PRN

## 2018-05-07 NOTE — Progress Notes (Signed)
Pre visit review using our clinic review tool, if applicable. No additional management support is needed unless otherwise documented below in the visit note. 

## 2018-05-07 NOTE — Patient Instructions (Addendum)
Robitussin DM or Mucinex DM green label during label  Zyrtec or allegra or claritin at night Flonase 2 sprays max otc  (above otc)   Try Tussionex for cough at night as needed    Tucks or Prep H  Try warm baths and pushing hemorrhoids back up  Avoid constipation increased water, fiber, prune juice  If continues to bother  Hemorrhoids Hemorrhoids are swollen veins in and around the rectum or anus. There are two types of hemorrhoids:  Internal hemorrhoids. These occur in the veins that are just inside the rectum. They may poke through to the outside and become irritated and painful.  External hemorrhoids. These occur in the veins that are outside the anus and can be felt as a painful swelling or hard lump near the anus. Most hemorrhoids do not cause serious problems, and they can be managed with home treatments such as diet and lifestyle changes. If home treatments do not help the symptoms, procedures can be done to shrink or remove the hemorrhoids. What are the causes? This condition is caused by increased pressure in the anal area. This pressure may result from various things, including:  Constipation.  Straining to have a bowel movement.  Diarrhea.  Pregnancy.  Obesity.  Sitting for long periods of time.  Heavy lifting or other activity that causes you to strain.  Anal sex.  Riding a bike for a long period of time. What are the signs or symptoms? Symptoms of this condition include:  Pain.  Anal itching or irritation.  Rectal bleeding.  Leakage of stool (feces).  Anal swelling.  One or more lumps around the anus. How is this diagnosed? This condition can often be diagnosed through a visual exam. Other exams or tests may also be done, such as:  An exam that involves feeling the rectal area with a gloved hand (digital rectal exam).  An exam of the anal canal that is done using a small tube (anoscope).  A blood test, if you have lost a significant amount of  blood.  A test to look inside the colon using a flexible tube with a camera on the end (sigmoidoscopy or colonoscopy). How is this treated? This condition can usually be treated at home. However, various procedures may be done if dietary changes, lifestyle changes, and other home treatments do not help your symptoms. These procedures can help make the hemorrhoids smaller or remove them completely. Some of these procedures involve surgery, and others do not. Common procedures include:  Rubber band ligation. Rubber bands are placed at the base of the hemorrhoids to cut off their blood supply.  Sclerotherapy. Medicine is injected into the hemorrhoids to shrink them.  Infrared coagulation. A type of light energy is used to get rid of the hemorrhoids.  Hemorrhoidectomy surgery. The hemorrhoids are surgically removed, and the veins that supply them are tied off.  Stapled hemorrhoidopexy surgery. The surgeon staples the base of the hemorrhoid to the rectal wall. Follow these instructions at home: Eating and drinking   Eat foods that have a lot of fiber in them, such as whole grains, beans, nuts, fruits, and vegetables.  Ask your health care provider about taking products that have added fiber (fiber supplements).  Reduce the amount of fat in your diet. You can do this by eating low-fat dairy products, eating less red meat, and avoiding processed foods.  Drink enough fluid to keep your urine pale yellow. Managing pain and swelling   Take warm sitz baths for 20  minutes, 3-4 times a day to ease pain and discomfort. You may do this in a bathtub or using a portable sitz bath that fits over the toilet.  If directed, apply ice to the affected area. Using ice packs between sitz baths may be helpful. ? Put ice in a plastic bag. ? Place a towel between your skin and the bag. ? Leave the ice on for 20 minutes, 2-3 times a day. General instructions  Take over-the-counter and prescription medicines  only as told by your health care provider.  Use medicated creams or suppositories as told.  Get regular exercise. Ask your health care provider how much and what kind of exercise is best for you. In general, you should do moderate exercise for at least 30 minutes on most days of the week (150 minutes each week). This can include activities such as walking, biking, or yoga.  Go to the bathroom when you have the urge to have a bowel movement. Do not wait.  Avoid straining to have bowel movements.  Keep the anal area dry and clean. Use wet toilet paper or moist towelettes after a bowel movement.  Do not sit on the toilet for long periods of time. This increases blood pooling and pain.  Keep all follow-up visits as told by your health care provider. This is important. Contact a health care provider if you have:  Increasing pain and swelling that are not controlled by treatment or medicine.  Difficulty having a bowel movement, or you are unable to have a bowel movement.  Pain or inflammation outside the area of the hemorrhoids. Get help right away if you have:  Uncontrolled bleeding from your rectum. Summary  Hemorrhoids are swollen veins in and around the rectum or anus.  Most hemorrhoids can be managed with home treatments such as diet and lifestyle changes.  Taking warm sitz baths can help ease pain and discomfort.  In severe cases, procedures or surgery can be done to shrink or remove the hemorrhoids. This information is not intended to replace advice given to you by your health care provider. Make sure you discuss any questions you have with your health care provider. Document Released: 04/18/2000 Document Revised: 09/10/2017 Document Reviewed: 09/10/2017 Elsevier Interactive Patient Education  2019 [employer].

## 2018-05-12 ENCOUNTER — Encounter: Payer: Self-pay | Admitting: Internal Medicine

## 2018-05-12 DIAGNOSIS — K649 Unspecified hemorrhoids: Secondary | ICD-10-CM | POA: Insufficient documentation

## 2018-05-12 NOTE — Progress Notes (Signed)
Chief Complaint  Patient presents with  . Cough  . Hemorrhoids   Sick visit  1. Cough cough x 3 weeks and went to urgent care given Augmentin x 1 week. Her voice was starting to get hoarse. She was also given prednisone at urgent care and tried Mucinex and Robitussin for sxs'.  Still c/o cough worse at night and would like something for cough  2. HTN on norvasc 2.5 mg qd and micardis 40 mg qd home readings 124/86 per pt slightly elevated today  3. Hemorrhoid in rectal region that is tender and sore and itchy at times and wants to know what to do about it 4. Anemia and elevated Cr-she still has not had labs yet and will do with next f/u    Review of Systems  Constitutional: Negative for weight loss.  HENT: Negative for hearing loss and sore throat.   Eyes: Negative for blurred vision.  Respiratory: Positive for cough.   Cardiovascular: Negative for chest pain.  Gastrointestinal:       +hemorrhoid   Musculoskeletal: Negative for falls.  Skin: Negative for rash.  Neurological: Negative for headaches.  Psychiatric/Behavioral: Negative for depression and memory loss.   Past Medical History:  Diagnosis Date  . Actinic keratoses   . Allergy   . Chicken pox   . Colon polyps   . Heart murmur    youth  . Hyperlipidemia   . Hypertension   . Obesity   . Urgency incontinence    Past Surgical History:  Procedure Laterality Date  . BUNIONECTOMY Bilateral   . COLONOSCOPY    . COLONOSCOPY WITH PROPOFOL N/A 04/18/2016   Procedure: COLONOSCOPY WITH PROPOFOL;  Surgeon: Manya Silvas, MD;  Location: [facility] ENDOSCOPY;  Service: Endoscopy;  Laterality: N/A;  . TONSILLECTOMY AND ADENOIDECTOMY     Family History  Problem Relation Age of Onset  . Arthritis Mother   . Diabetes Mother   . Stroke Mother   . Arthritis Father   . Diabetes Father   . Early death Father   . Heart disease Father        MI 11 y.o   . Diabetes Sister    Social History   Socioeconomic History  . Marital  status: Married    Spouse name: Not on file  . Number of children: Not on file  . Years of education: Not on file  . Highest education level: Not on file  Occupational History  . Not on file  Social Needs  . Financial resource strain: Not on file  . Food insecurity:    Worry: Not on file    Inability: Not on file  . Transportation needs:    Medical: Not on file    Non-medical: Not on file  Tobacco Use  . Smoking status: Never Smoker  . Smokeless tobacco: Never Used  Substance and Sexual Activity  . Alcohol use: Not on file    Comment: occasionally wine   . Drug use: No  . Sexual activity: Never  Lifestyle  . Physical activity:    Days per week: Not on file    Minutes per session: Not on file  . Stress: Not on file  Relationships  . Social connections:    Talks on phone: Not on file    Gets together: Not on file    Attends religious service: Not on file    Active member of club or organization: Not on file    Attends meetings of clubs or organizations:  Not on file    Relationship status: Not on file  . Intimate partner violence:    Fear of current or ex partner: Not on file    Emotionally abused: Not on file    Physically abused: Not on file    Forced sexual activity: Not on file  Other Topics Concern  . Not on file  Social History Narrative   Married    1 daughter age 61 as of 03/2017    Retired   Current Meds  Medication Sig  . amLODipine (NORVASC) 2.5 MG tablet Take 1 tablet (2.5 mg total) by mouth daily.  Marland Kitchen aspirin EC 81 MG tablet Take 81 mg by mouth daily.  . Calcium-Magnesium-Vitamin D (CALCIUM 1200+D3 PO) Take by mouth 2 (two) times daily.  Marland Kitchen docusate sodium (COLACE) 50 MG capsule Take 50 mg by mouth daily.  . Multiple Vitamin (MULTI-VITAMINS) TABS Take by mouth.  . oxybutynin (DITROPAN-XL) 10 MG 24 hr tablet Take 1 tablet (10 mg total) by mouth at bedtime.  . sennosides-docusate sodium (SENOKOT-S) 8.6-50 MG tablet Take 1 tablet by mouth daily.  Marland Kitchen  telmisartan (MICARDIS) 40 MG tablet Take 1 tablet (40 mg total) by mouth daily.   Allergies  Allergen Reactions  . Terfenadine Rash   Recent Results (from the past 2160 hour(s))  CBC with Differential/Platelet     Status: Abnormal   Collection Time: 03/02/18  8:26 AM  Result Value Ref Range   WBC 4.5 3.8 - 10.8 Thousand/uL   RBC 3.60 (L) 3.80 - 5.10 Million/uL   Hemoglobin 10.9 (L) 11.7 - 15.5 g/dL   HCT 32.9 (L) 35.0 - 45.0 %   MCV 91.4 80.0 - 100.0 fL   MCH 30.3 27.0 - 33.0 pg   MCHC 33.1 32.0 - 36.0 g/dL   RDW 12.0 11.0 - 15.0 %   Platelets 173 140 - 400 Thousand/uL   MPV 13.0 (H) 7.5 - 12.5 fL   Neutro Abs 2,660 1,500 - 7,800 cells/uL   Lymphs Abs 1,283 850 - 3,900 cells/uL   WBC mixed population 396 200 - 950 cells/uL   Eosinophils Absolute 122 15 - 500 cells/uL   Basophils Absolute 41 0 - 200 cells/uL   Neutrophils Relative % 59.1 %   Total Lymphocyte 28.5 %   Monocytes Relative 8.8 %   Eosinophils Relative 2.7 %   Basophils Relative 0.9 %  Comprehensive metabolic panel     Status: Abnormal   Collection Time: 03/02/18  8:26 AM  Result Value Ref Range   Glucose, Bld 94 65 - 99 mg/dL    Comment: .            Fasting reference interval .    BUN 17 7 - 25 mg/dL   Creat 1.15 (H) 0.50 - 0.99 mg/dL    Comment: For patients >50 years of age, the reference limit for Creatinine is approximately 13% higher for people identified as African-American. .    BUN/Creatinine Ratio 15 6 - 22 (calc)   Sodium 141 135 - 146 mmol/L   Potassium 4.0 3.5 - 5.3 mmol/L   Chloride 105 98 - 110 mmol/L   CO2 24 20 - 32 mmol/L   Calcium 9.7 8.6 - 10.4 mg/dL   Total Protein 6.4 6.1 - 8.1 g/dL   Albumin 4.0 3.6 - 5.1 g/dL   Globulin 2.4 1.9 - 3.7 g/dL (calc)   AG Ratio 1.7 1.0 - 2.5 (calc)   Total Bilirubin 0.6 0.2 - 1.2 mg/dL  Alkaline phosphatase (APISO) 22 (L) 33 - 130 U/L   AST 23 10 - 35 U/L   ALT 11 6 - 29 U/L  Measles/Mumps/Rubella Immunity     Status: None   Collection Time:  03/02/18  8:26 AM  Result Value Ref Range   Rubeola IgG >300.00 AU/mL    Comment: AU/mL            Interpretation -----            -------------- <25.00           Negative 25.00-29.99      Equivocal >29.99           Positive . A positive result indicates that the patient has antibody to measles virus. It does not differentiate  between an active or past infection. The clinical  diagnosis must be interpreted in conjunction with  clinical signs and symptoms of the patient.    Mumps IgG >300.00 AU/mL    Comment:  AU/mL           Interpretation -------         ---------------- <9.00             Negative 9.00-10.99        Equivocal >10.99            Positive A positive result indicates that the patient has  antibody to mumps virus. It does not differentiate between an  active or past infection. The clinical diagnosis must be interpreted in conjunction with clinical signs and symptoms of the patient. .    Rubella 21.00 index    Comment:     Index            Interpretation     -----            --------------       <0.90            Not consistent with Immunity     0.90-0.99        Equivocal     > or = 1.00      Consistent with Immunity  . The presence of rubella IgG antibody suggests  immunization or past or current infection with rubella virus.    Objective  Body mass index is 28.06 kg/m. Wt Readings from Last 3 Encounters:  05/07/18 168 lb 9.6 oz (76.5 kg)  03/17/18 167 lb 3.2 oz (75.8 kg)  02/12/18 169 lb 4 oz (76.8 kg)   Temp Readings from Last 3 Encounters:  05/07/18 98.2 F (36.8 C) (Oral)  03/17/18 (!) 97.5 F (36.4 C) (Oral)  02/12/18 97.8 F (36.6 C) (Oral)   BP Readings from Last 3 Encounters:  05/07/18 136/90  03/17/18 132/88  02/12/18 (!) 168/80   Pulse Readings from Last 3 Encounters:  05/07/18 69  03/17/18 62  02/12/18 (!) 51    Physical Exam Vitals signs and nursing note reviewed.  Constitutional:      Appearance: Normal appearance. She is  well-developed.  HENT:     Head: Normocephalic and atraumatic.     Nose: Nose normal.     Mouth/Throat:     Mouth: Mucous membranes are moist.     Pharynx: Oropharynx is clear.  Eyes:     Conjunctiva/sclera: Conjunctivae normal.     Pupils: Pupils are equal, round, and reactive to light.  Cardiovascular:     Rate and Rhythm: Normal rate and regular rhythm.     Heart sounds: Normal heart sounds.  Pulmonary:  Effort: Pulmonary effort is normal.     Breath sounds: Normal breath sounds.  Genitourinary:    Rectum: External hemorrhoid present.     Comments: Large hemorrhoid with blue hue right anal region ~2 cm  Skin:    General: Skin is warm and dry.  Neurological:     General: No focal deficit present.     Mental Status: She is alert and oriented to person, place, and time.     Gait: Gait normal.  Psychiatric:        Attention and Perception: Attention and perception normal.        Mood and Affect: Mood and affect normal.        Speech: Speech normal.        Behavior: Behavior normal. Behavior is cooperative.        Thought Content: Thought content normal.        Cognition and Memory: Cognition and memory normal.        Judgment: Judgment normal.     Assessment   1. URI with residual cough  2. HTN 3. Hemorrhoid  4. New anemia and elevated Creatinine will w/u future with labs  5. HM Plan   1. Tussionex prn qhs  2. Cont meds monitor BP at home  3. anusol  If worse refer GI/surgery disc'ed  4. W/u further with labs at f/u  5.  Flushot utd Tdap1/25/19 shingrix 1/2 due repeat before 09/2018 Never smoker immune MMR  Colonoscopy 04/18/16 IH, diverticulosis f/u in 5 years   Paphad 04/03/18 mammo Due 05/22/2018 sch 05/24/18 Consider DEXA age 72 F/u Dr. Melvern Sample saw 10/08/17   Provider: Dr. Olivia Mackie McLean-Scocuzza-Internal Medicine

## 2018-05-24 ENCOUNTER — Ambulatory Visit
Admission: RE | Admit: 2018-05-24 | Discharge: 2018-05-24 | Disposition: A | Discharge status: Home / Self Care | Payer: BLUE CROSS/BLUE SHIELD | Source: Ambulatory Visit | Attending: Internal Medicine | Admitting: Internal Medicine

## 2018-05-24 DIAGNOSIS — Z1231 Encounter for screening mammogram for malignant neoplasm of breast: Secondary | ICD-10-CM | POA: Insufficient documentation

## 2018-05-27 ENCOUNTER — Telehealth: Payer: Self-pay | Admitting: Internal Medicine

## 2018-05-27 NOTE — Telephone Encounter (Signed)
Copied from [place] [id number]. Topic: Quick Communication - Lab Results (Clinic Use ONLY) >> May 27, 2018  2:07 PM Babs Bertin, CMA wrote: Called patient to inform them of [id number] lab results. When patient returns call, triage nurse may disclose results. >> May 27, 2018  4:38 PM Bea Graff, NT wrote: Pt calling back to receive mammogram results.

## 2018-05-27 NOTE — Telephone Encounter (Signed)
Pt given lab results and documented in result note.  

## 2018-06-23 ENCOUNTER — Other Ambulatory Visit: Payer: Self-pay | Admitting: Internal Medicine

## 2018-06-23 DIAGNOSIS — N3281 Overactive bladder: Secondary | ICD-10-CM

## 2018-06-23 MED ORDER — OXYBUTYNIN CHLORIDE ER 10 MG PO TB24: 10.0000 mg | ORAL_TABLET | Freq: Every day | ORAL | 0 refills | Status: DC

## 2018-06-23 NOTE — Telephone Encounter (Signed)
Copied from [place] [id number]. Topic: Quick Communication - Rx Refill/Question >> Jun 23, 2018  1:28 PM Bea Graff, NT wrote: Medication: oxybutynin (DITROPAN-XL) 10 MG 24 hr tablet   Has the patient contacted their pharmacy? Yes.   (Agent: If no, request that the patient contact the pharmacy for the refill.) (Agent: If yes, when and what did the pharmacy advise?)  Preferred Pharmacy (with phone number or street name): [id number] - [place], [place] - [address] [phone] (Phone) [phone] (Fax)    Agent: Please be advised that RX refills may take up to 3 business days. We ask that you follow-up with your pharmacy.

## 2018-06-23 NOTE — Telephone Encounter (Signed)
Requested Prescriptions  Pending Prescriptions Disp Refills  . oxybutynin (DITROPAN-XL) 10 MG 24 hr tablet 90 tablet 0    Sig: Take 1 tablet (10 mg total) by mouth at bedtime.     Urology:  Bladder Agents Passed - 06/23/2018  1:32 PM      Passed - Valid encounter within last 12 months    Recent Outpatient Visits          1 month ago Upper respiratory tract infection, unspecified type   [place], Nino Glow, MD   3 months ago Essential hypertension   [place], Nino Glow, MD   4 months ago Essential hypertension   [place] McLean-Scocuzza, Nino Glow, MD   8 months ago Essential hypertension   [facility] McLean-Scocuzza, Nino Glow, MD   1 year ago Essential hypertension   [facility] [place], Nino Glow, MD      Future Appointments            In 1 month McLean-Scocuzza, Nino Glow, MD [facility]

## 2018-07-16 ENCOUNTER — Ambulatory Visit: Payer: BLUE CROSS/BLUE SHIELD | Admitting: Internal Medicine

## 2018-07-23 ENCOUNTER — Encounter: Payer: Self-pay | Admitting: Internal Medicine

## 2018-07-23 ENCOUNTER — Other Ambulatory Visit: Payer: Self-pay

## 2018-07-23 ENCOUNTER — Ambulatory Visit (INDEPENDENT_AMBULATORY_CARE_PROVIDER_SITE_OTHER): Payer: BLUE CROSS/BLUE SHIELD | Admitting: Internal Medicine

## 2018-07-23 VITALS — BP 110/80 | HR 78 | Temp 97.9°F | Ht 65.0 in | Wt 167.8 lb

## 2018-07-23 DIAGNOSIS — I10 Essential (primary) hypertension: Secondary | ICD-10-CM | POA: Diagnosis not present

## 2018-07-23 DIAGNOSIS — Z23 Encounter for immunization: Secondary | ICD-10-CM | POA: Diagnosis not present

## 2018-07-23 DIAGNOSIS — N3281 Overactive bladder: Secondary | ICD-10-CM

## 2018-07-23 MED ORDER — TELMISARTAN 20 MG PO TABS: 20.0000 mg | ORAL_TABLET | Freq: Every day | ORAL | 3 refills | Status: DC

## 2018-07-23 MED ORDER — OXYBUTYNIN CHLORIDE ER 10 MG PO TB24: 10.0000 mg | ORAL_TABLET | Freq: Every day | ORAL | 3 refills | Status: DC

## 2018-07-23 NOTE — Progress Notes (Signed)
Chief Complaint  Patient presents with  . Follow-up    4 mo   F/u  1. HTN controlled today at times she is a bit dizzy and BP runs 99/70 on micardisc 40 and norvasc 2.5 mg qd  -she is not fasting for labs but will reschedule  2. Due for 2nd shingrix vaccine today  3. Aks to face and chest s/p LN2 Dr. Nehemiah Massed    Review of Systems  Constitutional: Negative for weight loss.  HENT: Negative for hearing loss.   Eyes: Negative for blurred vision.  Respiratory: Negative for shortness of breath.   Cardiovascular: Negative for chest pain.  Genitourinary:       Overactive bladder controlled with meds    Skin: Negative for rash.  Neurological: Positive for dizziness.  Psychiatric/Behavioral: Negative for depression.   Past Medical History:  Diagnosis Date  . Actinic keratoses   . Allergy   . Chicken pox   . Colon polyps   . Heart murmur    youth  . Hyperlipidemia   . Hypertension   . Obesity   . Urgency incontinence    Past Surgical History:  Procedure Laterality Date  . BUNIONECTOMY Bilateral   . COLONOSCOPY    . COLONOSCOPY WITH PROPOFOL N/A 04/18/2016   Procedure: COLONOSCOPY WITH PROPOFOL;  Surgeon: Manya Silvas, MD;  Location: [facility] ENDOSCOPY;  Service: Endoscopy;  Laterality: N/A;  . TONSILLECTOMY AND ADENOIDECTOMY     Family History  Problem Relation Age of Onset  . Arthritis Mother   . Diabetes Mother   . Stroke Mother   . Arthritis Father   . Diabetes Father   . Early death Father   . Heart disease Father        MI 71 y.o   . Diabetes Sister   . Breast cancer Neg Hx    Social History   Socioeconomic History  . Marital status: Married    Spouse name: Not on file  . Number of children: Not on file  . Years of education: Not on file  . Highest education level: Not on file  Occupational History  . Not on file  Social Needs  . Financial resource strain: Not on file  . Food insecurity:    Worry: Not on file    Inability: Not on file  .  Transportation needs:    Medical: Not on file    Non-medical: Not on file  Tobacco Use  . Smoking status: Never Smoker  . Smokeless tobacco: Never Used  Substance and Sexual Activity  . Alcohol use: Not on file    Comment: occasionally wine   . Drug use: No  . Sexual activity: Never  Lifestyle  . Physical activity:    Days per week: Not on file    Minutes per session: Not on file  . Stress: Not on file  Relationships  . Social connections:    Talks on phone: Not on file    Gets together: Not on file    Attends religious service: Not on file    Active member of club or organization: Not on file    Attends meetings of clubs or organizations: Not on file    Relationship status: Not on file  . Intimate partner violence:    Fear of current or ex partner: Not on file    Emotionally abused: Not on file    Physically abused: Not on file    Forced sexual activity: Not on file  Other Topics Concern  .  Not on file  Social History Narrative   Married    1 daughter age 30 as of 03/2017    Retired   Current Meds  Medication Sig  . amLODipine (NORVASC) 2.5 MG tablet Take 1 tablet (2.5 mg total) by mouth daily.  Marland Kitchen aspirin EC 81 MG tablet Take 81 mg by mouth daily.  . Calcium-Magnesium-Vitamin D (CALCIUM 1200+D3 PO) Take by mouth 2 (two) times daily.  . chlorpheniramine-HYDROcodone (TUSSIONEX PENNKINETIC ER) 10-8 MG/5ML SUER Take 5 mLs by mouth at bedtime as needed for cough.  . docusate sodium (COLACE) 50 MG capsule Take 50 mg by mouth daily.  . hydrocortisone (ANUSOL-HC) 2.5 % rectal cream Place 1 application rectally 2 (two) times daily. Prn as needed  . Multiple Vitamin (MULTI-VITAMINS) TABS Take by mouth.  . oxybutynin (DITROPAN-XL) 10 MG 24 hr tablet Take 1 tablet (10 mg total) by mouth at bedtime.  . sennosides-docusate sodium (SENOKOT-S) 8.6-50 MG tablet Take 1 tablet by mouth daily.  Marland Kitchen telmisartan (MICARDIS) 40 MG tablet Take 1 tablet (40 mg total) by mouth daily.   Allergies   Allergen Reactions  . Terfenadine Rash   No results found for this or any previous visit (from the past 2160 hour(s)). Objective  Body mass index is 27.92 kg/m. Wt Readings from Last 3 Encounters:  07/23/18 167 lb 12.8 oz (76.1 kg)  05/07/18 168 lb 9.6 oz (76.5 kg)  03/17/18 167 lb 3.2 oz (75.8 kg)   Temp Readings from Last 3 Encounters:  07/23/18 97.9 F (36.6 C) (Oral)  05/07/18 98.2 F (36.8 C) (Oral)  03/17/18 (!) 97.5 F (36.4 C) (Oral)   BP Readings from Last 3 Encounters:  07/23/18 110/80  05/07/18 136/90  03/17/18 132/88   Pulse Readings from Last 3 Encounters:  07/23/18 78  05/07/18 69  03/17/18 62    Physical Exam Vitals signs and nursing note reviewed.  Constitutional:      Appearance: Normal appearance. She is well-developed and well-groomed.  HENT:     Head: Normocephalic and atraumatic.     Nose: Nose normal.     Mouth/Throat:     Mouth: Mucous membranes are moist.     Pharynx: Oropharynx is clear.  Eyes:     Conjunctiva/sclera: Conjunctivae normal.     Pupils: Pupils are equal, round, and reactive to light.  Cardiovascular:     Rate and Rhythm: Normal rate and regular rhythm.     Heart sounds: Normal heart sounds. No murmur.  Pulmonary:     Effort: Pulmonary effort is normal.     Breath sounds: Normal breath sounds.  Skin:    General: Skin is warm and dry.  Neurological:     General: No focal deficit present.     Mental Status: She is alert and oriented to person, place, and time. Mental status is at baseline.     Gait: Gait normal.  Psychiatric:        Attention and Perception: Attention and perception normal.        Mood and Affect: Mood and affect normal.        Speech: Speech normal.        Behavior: Behavior normal. Behavior is cooperative.        Thought Content: Thought content normal.        Cognition and Memory: Cognition and memory normal.        Judgment: Judgment normal.     Assessment   1. HTN 2. HM Plan   1.  Reduce  telmisartan to 20 mg qd and norvasc 2.5  sch fasting labs in 3-6 months  2.  Flushotutd Tdap1/25/19 shingrix 1/2 due repeat before 09/2018 -given 2nd shingrix today   Never smoker immuneMMR  Colonoscopy 04/18/16 IH, diverticulosis f/u in 5 years   Paphad 04/03/17 f/u in 3 years  mammo 05/24/18 normal  Consider DEXA age 76 F/u Dr. Melvern Sample saw 6/6/19and 2020 ln2 to face and chest   Provider: Dr. Olivia Mackie McLean-Scocuzza-Internal Medicine

## 2018-07-23 NOTE — Addendum Note (Signed)
Addended by: Fulton Mole D on: 07/23/2018 04:29 PM   Modules accepted: Orders

## 2018-07-23 NOTE — Patient Instructions (Addendum)
Reduce telmisartan to 20 mg daily with norvasc 2.5 mg daily  Schedule fasting labs in the next 3-6 months   Take care keep track of your blood pressure  Be safe   Recombinant Zoster (Shingles) Vaccine, RZV: What You Need to Know 1. Why get vaccinated? Shingles (also called herpes zoster, or just zoster) is a painful skin rash, often with blisters. Shingles is caused by the varicella zoster virus, the same virus that causes chickenpox. After you have chickenpox, the virus stays in your body and can cause shingles later in life. You can't catch shingles from another person. However, a person who has never had chickenpox (or chickenpox vaccine) could get chickenpox from someone with shingles. A shingles rash usually appears on one side of the face or body and heals within 2 to 4 weeks. Its main symptom is pain, which can be severe. Other symptoms can include fever, headache, chills, and upset stomach. Very rarely, a shingles infection can lead to pneumonia, hearing problems, blindness, brain inflammation (encephalitis), or death. For about 1 person in 5, severe pain can continue even long after the rash has cleared up. This long-lasting pain is called post-herpetic neuralgia (PHN). Shingles is far more common in people 71 years of age and older than in younger people, and the risk increases with age. It is also more common in people whose immune system is weakened because of a disease such as cancer, or by drugs such as steroids or chemotherapy. At least 1 million people a year in the [place] get shingles. 2. Shingles vaccine (recombinant) Recombinant shingles vaccine was approved by FDA in 2017 for the prevention of shingles. In clinical trials, it was more than 90% effective in preventing shingles. It can also reduce the likelihood of PHN. Two doses, 2 to 6 months apart, are recommended for adults 41 and older. This vaccine is also recommended for people who have already gotten the live  shingles vaccine (Zostavax). There is no live virus in this vaccine. 3. Some people should not get this vaccine Tell your vaccine provider if you:  Have any severe, life-threatening allergies. A person who has ever had a life-threatening allergic reaction after a dose of recombinant shingles vaccine, or has a severe allergy to any component of this vaccine, may be advised not to be vaccinated. Ask your health care provider if you want information about vaccine components.  Are pregnant or breastfeeding. There is not much information about use of recombinant shingles vaccine in pregnant or nursing women. Your healthcare provider might recommend delaying vaccination.  Are not feeling well. If you have a mild illness, such as a cold, you can probably get the vaccine today. If you are moderately or severely ill, you should probably wait until you recover. Your doctor can advise you. 4. Risks of a vaccine reaction With any medicine, including vaccines, there is a chance of reactions. After recombinant shingles vaccination, a person might experience:  Pain, redness, soreness, or swelling at the site of the injection  Headache, muscle aches, fever, shivering, fatigue In clinical trials, most people got a sore arm with mild or moderate pain after vaccination, and some also had redness and swelling where they got the shot. Some people felt tired, had muscle pain, a headache, shivering, fever, stomach pain, or nausea. About 1 out of 6 people who got recombinant zoster vaccine experienced side effects that prevented them from doing regular activities. Symptoms went away on their own in about 2 to 3 days. Side  effects were more common in younger people. You should still get the second dose of recombinant zoster vaccine even if you had one of these reactions after the first dose. Other things that could happen after this vaccine:  People sometimes faint after medical procedures, including vaccination. Sitting  or lying down for about 15 minutes can help prevent fainting and injuries caused by a fall. Tell your provider if you feel dizzy or have vision changes or ringing in the ears.  Some people get shoulder pain that can be more severe and longer-lasting than routine soreness that can follow injections. This happens very rarely.  Any medication can cause a severe allergic reaction. Such reactions to a vaccine are estimated at about 1 in a million doses, and would happen within a few minutes to a few hours after the vaccination. As with any medicine, there is a very remote chance of a vaccine causing a serious injury or death. The safety of vaccines is always being monitored. For more information, visit: http://www.aguilar.org/ 5. What if there is a serious problem? What should I look for?  Look for anything that concerns you, such as signs of a severe allergic reaction, very high fever, or unusual behavior. Signs of a severe allergic reaction can include hives, swelling of the face and throat, difficulty breathing, a fast heartbeat, dizziness, and weakness. These would usually start a few minutes to a few hours after the vaccination. What should I do?  If you think it is a severe allergic reaction or other emergency that can't wait, call 9-1-1 or get to the nearest hospital. Otherwise, call your health care provider. Afterward, the reaction should be reported to the Vaccine Adverse Event Reporting System (VAERS). Your doctor should file this report, or you can do it yourself through the VAERS website at www.vaers.SamedayNews.es, or by calling 628-234-6216. VAERS does not give medical advice. 6. How can I learn more?  Ask your health care provider. He or she can give you the vaccine package insert or suggest other sources of information.  Call your local or state health department.  Contact the Centers for Disease Control and Prevention (CDC): ? Call 631-544-9501 (1-800-CDC-INFO) or ? Visit CDC's  vaccines website at http://hunter.com/ CDC Vaccine Information Statement Recombinant Zoster Vaccine (06/16/2016) This information is not intended to replace advice given to you by your health care provider. Make sure you discuss any questions you have with your health care provider. Document Released: 07/01/2016 Document Revised: 11/25/2017 Document Reviewed: 11/25/2017 Elsevier Interactive Patient Education  2019 [employer].

## 2019-02-16 ENCOUNTER — Other Ambulatory Visit: Payer: Self-pay

## 2019-02-16 ENCOUNTER — Other Ambulatory Visit (INDEPENDENT_AMBULATORY_CARE_PROVIDER_SITE_OTHER): Payer: BC Managed Care – PPO

## 2019-02-16 DIAGNOSIS — I10 Essential (primary) hypertension: Secondary | ICD-10-CM | POA: Diagnosis not present

## 2019-02-16 DIAGNOSIS — E559 Vitamin D deficiency, unspecified: Secondary | ICD-10-CM

## 2019-02-16 DIAGNOSIS — Z1329 Encounter for screening for other suspected endocrine disorder: Secondary | ICD-10-CM | POA: Diagnosis not present

## 2019-02-16 DIAGNOSIS — Z23 Encounter for immunization: Secondary | ICD-10-CM | POA: Diagnosis not present

## 2019-02-16 DIAGNOSIS — D649 Anemia, unspecified: Secondary | ICD-10-CM | POA: Diagnosis not present

## 2019-02-16 DIAGNOSIS — E785 Hyperlipidemia, unspecified: Secondary | ICD-10-CM | POA: Diagnosis not present

## 2019-02-16 DIAGNOSIS — R7989 Other specified abnormal findings of blood chemistry: Secondary | ICD-10-CM

## 2019-02-16 LAB — LIPID PANEL
Cholesterol: 228 mg/dL — ABNORMAL HIGH (ref 0–200)
HDL: 85.5 mg/dL (ref >39.00)
LDL Cholesterol: 133 mg/dL — ABNORMAL HIGH (ref 0–99)
NonHDL: 142.71
Total CHOL/HDL Ratio: 3
Triglycerides: 51 mg/dL (ref 0.0–149.0)
VLDL: 10.2 mg/dL (ref 0.0–40.0)

## 2019-02-16 LAB — CBC WITH DIFFERENTIAL/PLATELET
Basophils Absolute: 0 10*3/uL (ref 0.0–0.1)
Basophils Relative: 0.8 % (ref 0.0–3.0)
Eosinophils Absolute: 0.1 10*3/uL (ref 0.0–0.7)
Eosinophils Relative: 2.2 % (ref 0.0–5.0)
HCT: 36.3 % (ref 36.0–46.0)
Hemoglobin: 11.9 g/dL — ABNORMAL LOW (ref 12.0–15.0)
Lymphocytes Relative: 31.4 % (ref 12.0–46.0)
Lymphs Abs: 1.7 10*3/uL (ref 0.7–4.0)
MCHC: 32.7 g/dL (ref 30.0–36.0)
MCV: 93.5 fl (ref 78.0–100.0)
Monocytes Absolute: 0.4 10*3/uL (ref 0.1–1.0)
Monocytes Relative: 7.7 % (ref 3.0–12.0)
Neutro Abs: 3.2 10*3/uL (ref 1.4–7.7)
Neutrophils Relative %: 57.9 % (ref 43.0–77.0)
Platelets: 191 10*3/uL (ref 150.0–400.0)
RBC: 3.89 Mil/uL (ref 3.87–5.11)
RDW: 13.2 % (ref 11.5–15.5)
WBC: 5.5 10*3/uL (ref 4.0–10.5)

## 2019-02-16 LAB — COMPREHENSIVE METABOLIC PANEL
ALT: 16 U/L (ref 0–35)
AST: 23 U/L (ref 0–37)
Albumin: 4.5 g/dL (ref 3.5–5.2)
Alkaline Phosphatase: 25 U/L — ABNORMAL LOW (ref 39–117)
BUN: 19 mg/dL (ref 6–23)
CO2: 28 mEq/L (ref 19–32)
Calcium: 9.8 mg/dL (ref 8.4–10.5)
Chloride: 105 mEq/L (ref 96–112)
Creatinine, Ser: 1.03 mg/dL (ref 0.40–1.20)
GFR: 53.84 mL/min — ABNORMAL LOW (ref >60.00)
Glucose, Bld: 84 mg/dL (ref 70–99)
Potassium: 4.1 mEq/L (ref 3.5–5.1)
Sodium: 142 mEq/L (ref 135–145)
Total Bilirubin: 0.6 mg/dL (ref 0.2–1.2)
Total Protein: 6.8 g/dL (ref 6.0–8.3)

## 2019-02-16 LAB — TSH: TSH: 1.71 u[IU]/mL (ref 0.35–4.50)

## 2019-02-16 LAB — VITAMIN D 25 HYDROXY (VIT D DEFICIENCY, FRACTURES): VITD: 48.02 ng/mL (ref 30.00–100.00)

## 2019-02-16 NOTE — Addendum Note (Signed)
Addended by: Leeanne Rio on: 02/16/2019 08:48 AM   Modules accepted: Orders

## 2019-02-17 LAB — URINALYSIS, ROUTINE W REFLEX MICROSCOPIC
Bacteria, UA: NONE SEEN /HPF
Bilirubin Urine: NEGATIVE
Glucose, UA: NEGATIVE
Hgb urine dipstick: NEGATIVE
Hyaline Cast: NONE SEEN /LPF
Ketones, ur: NEGATIVE
Nitrite: NEGATIVE
Protein, ur: NEGATIVE
RBC / HPF: NONE SEEN /HPF (ref 0–2)
Specific Gravity, Urine: 1.017 (ref 1.001–1.03)
Squamous Epithelial / HPF: NONE SEEN /HPF (ref <=5)
WBC, UA: NONE SEEN /HPF (ref 0–5)
pH: 7 (ref 5.0–8.0)

## 2019-02-18 LAB — PROTEIN ELECTROPHORESIS, SERUM
Albumin ELP: 4.3 g/dL (ref 3.8–4.8)
Alpha 1: 0.3 g/dL (ref 0.2–0.3)
Alpha 2: 0.7 g/dL (ref 0.5–0.9)
Beta 2: 0.3 g/dL (ref 0.2–0.5)
Beta Globulin: 0.4 g/dL (ref 0.4–0.6)
Gamma Globulin: 0.8 g/dL (ref 0.8–1.7)
Total Protein: 6.7 g/dL (ref 6.1–8.1)

## 2019-02-18 LAB — IRON,TIBC AND FERRITIN PANEL
%SAT: 39 % (calc) (ref 16–45)
Ferritin: 79 ng/mL (ref 16–288)
Iron: 117 ug/dL (ref 45–160)
TIBC: 297 mcg/dL (calc) (ref 250–450)

## 2019-02-21 LAB — PROTEIN ELECTROPHORESIS, URINE REFLEX
Albumin ELP, Urine: 0 %
Alpha-1-Globulin, U: 0 %
Alpha-2-Globulin, U: 0 %
Beta Globulin, U: 0 %
Gamma Globulin, U: 0 %
Protein, Ur: <4 mg/dL

## 2019-02-21 LAB — MICROALBUMIN / CREATININE URINE RATIO
Creatinine, Urine: 30.6 mg/dL
Microalb/Creat Ratio: <10 mg/g creat (ref 0–29)
Microalbumin, Urine: <3 ug/mL

## 2019-02-21 LAB — SODIUM, URINE, RANDOM: Sodium, Ur: 71 mmol/L

## 2019-02-24 ENCOUNTER — Telehealth: Payer: Self-pay

## 2019-02-24 ENCOUNTER — Other Ambulatory Visit: Payer: Self-pay | Admitting: Internal Medicine

## 2019-02-24 DIAGNOSIS — E785 Hyperlipidemia, unspecified: Secondary | ICD-10-CM

## 2019-02-24 MED ORDER — ATORVASTATIN CALCIUM 10 MG PO TABS: 10.0000 mg | ORAL_TABLET | Freq: Every day | ORAL | 3 refills | Status: DC

## 2019-02-24 NOTE — Telephone Encounter (Signed)
Copied from [place] [phone]. Topic: General - Other >> Feb 24, 2019  4:24 PM Virl Axe D wrote: Reason for CRM: Pt returned vm for lab results. Declined to speak with NT. Requesting Fransisco Beau to return call with lab results.

## 2019-02-24 NOTE — Telephone Encounter (Signed)
Please see result note 

## 2019-03-02 ENCOUNTER — Other Ambulatory Visit: Payer: Self-pay | Admitting: Internal Medicine

## 2019-03-02 DIAGNOSIS — I10 Essential (primary) hypertension: Secondary | ICD-10-CM

## 2019-03-02 MED ORDER — AMLODIPINE BESYLATE 2.5 MG PO TABS: 2.5000 mg | ORAL_TABLET | Freq: Every day | ORAL | 3 refills | Status: DC

## 2019-04-05 ENCOUNTER — Other Ambulatory Visit: Payer: Self-pay

## 2019-04-05 DIAGNOSIS — Z20822 Contact with and (suspected) exposure to covid-19: Secondary | ICD-10-CM

## 2019-04-07 LAB — NOVEL CORONAVIRUS, NAA: SARS-CoV-2, NAA: NOT DETECTED

## 2019-04-27 ENCOUNTER — Ambulatory Visit: Payer: BC Managed Care – PPO | Attending: Internal Medicine

## 2019-04-27 DIAGNOSIS — Z20822 Contact with and (suspected) exposure to covid-19: Secondary | ICD-10-CM

## 2019-04-28 LAB — NOVEL CORONAVIRUS, NAA: SARS-CoV-2, NAA: NOT DETECTED

## 2019-05-26 ENCOUNTER — Other Ambulatory Visit: Payer: Self-pay | Admitting: Internal Medicine

## 2019-05-26 ENCOUNTER — Ambulatory Visit
Admission: RE | Admit: 2019-05-26 | Discharge: 2019-05-26 | Disposition: A | Discharge status: Home / Self Care | Payer: BC Managed Care – PPO | Source: Ambulatory Visit | Attending: Internal Medicine | Admitting: Internal Medicine

## 2019-05-26 DIAGNOSIS — Z1231 Encounter for screening mammogram for malignant neoplasm of breast: Secondary | ICD-10-CM

## 2019-06-21 DIAGNOSIS — H1032 Unspecified acute conjunctivitis, left eye: Secondary | ICD-10-CM | POA: Diagnosis not present

## 2019-06-27 DIAGNOSIS — Z1283 Encounter for screening for malignant neoplasm of skin: Secondary | ICD-10-CM | POA: Diagnosis not present

## 2019-06-27 DIAGNOSIS — D1801 Hemangioma of skin and subcutaneous tissue: Secondary | ICD-10-CM | POA: Diagnosis not present

## 2019-06-27 DIAGNOSIS — L814 Other melanin hyperpigmentation: Secondary | ICD-10-CM | POA: Diagnosis not present

## 2019-06-27 DIAGNOSIS — L308 Other specified dermatitis: Secondary | ICD-10-CM | POA: Diagnosis not present

## 2019-06-27 DIAGNOSIS — L719 Rosacea, unspecified: Secondary | ICD-10-CM | POA: Diagnosis not present

## 2019-06-27 DIAGNOSIS — D229 Melanocytic nevi, unspecified: Secondary | ICD-10-CM | POA: Diagnosis not present

## 2019-06-27 DIAGNOSIS — L578 Other skin changes due to chronic exposure to nonionizing radiation: Secondary | ICD-10-CM | POA: Diagnosis not present

## 2019-06-27 DIAGNOSIS — D2230 Melanocytic nevi of unspecified part of face: Secondary | ICD-10-CM | POA: Diagnosis not present

## 2019-06-27 DIAGNOSIS — L821 Other seborrheic keratosis: Secondary | ICD-10-CM | POA: Diagnosis not present

## 2019-06-27 DIAGNOSIS — D225 Melanocytic nevi of trunk: Secondary | ICD-10-CM | POA: Diagnosis not present

## 2019-06-29 DIAGNOSIS — R69 Illness, unspecified: Secondary | ICD-10-CM | POA: Diagnosis not present

## 2019-07-22 DIAGNOSIS — I10 Essential (primary) hypertension: Secondary | ICD-10-CM | POA: Diagnosis not present

## 2019-07-22 DIAGNOSIS — K219 Gastro-esophageal reflux disease without esophagitis: Secondary | ICD-10-CM | POA: Diagnosis not present

## 2019-07-22 DIAGNOSIS — Z9981 Dependence on supplemental oxygen: Secondary | ICD-10-CM | POA: Diagnosis not present

## 2019-07-22 DIAGNOSIS — K5900 Constipation, unspecified: Secondary | ICD-10-CM | POA: Diagnosis not present

## 2019-07-22 DIAGNOSIS — N3281 Overactive bladder: Secondary | ICD-10-CM | POA: Diagnosis not present

## 2019-07-22 DIAGNOSIS — Z7982 Long term (current) use of aspirin: Secondary | ICD-10-CM | POA: Diagnosis not present

## 2019-07-22 DIAGNOSIS — R32 Unspecified urinary incontinence: Secondary | ICD-10-CM | POA: Diagnosis not present

## 2019-07-22 DIAGNOSIS — Z8249 Family history of ischemic heart disease and other diseases of the circulatory system: Secondary | ICD-10-CM | POA: Diagnosis not present

## 2019-07-22 DIAGNOSIS — Z833 Family history of diabetes mellitus: Secondary | ICD-10-CM | POA: Diagnosis not present

## 2019-07-22 DIAGNOSIS — E785 Hyperlipidemia, unspecified: Secondary | ICD-10-CM | POA: Diagnosis not present

## 2019-07-29 ENCOUNTER — Other Ambulatory Visit: Payer: Self-pay | Admitting: Internal Medicine

## 2019-07-29 DIAGNOSIS — I10 Essential (primary) hypertension: Secondary | ICD-10-CM

## 2019-07-29 MED ORDER — TELMISARTAN 20 MG PO TABS: 20.0000 mg | ORAL_TABLET | Freq: Every day | ORAL | 3 refills | Status: DC

## 2019-08-10 ENCOUNTER — Ambulatory Visit: Payer: Medicare HMO | Admitting: Dermatology

## 2019-08-10 ENCOUNTER — Other Ambulatory Visit: Payer: Self-pay

## 2019-08-10 ENCOUNTER — Encounter: Payer: Self-pay | Admitting: Dermatology

## 2019-08-10 DIAGNOSIS — L308 Other specified dermatitis: Secondary | ICD-10-CM | POA: Diagnosis not present

## 2019-08-10 DIAGNOSIS — L720 Epidermal cyst: Secondary | ICD-10-CM | POA: Diagnosis not present

## 2019-08-10 DIAGNOSIS — L237 Allergic contact dermatitis due to plants, except food: Secondary | ICD-10-CM

## 2019-08-10 DIAGNOSIS — L309 Dermatitis, unspecified: Secondary | ICD-10-CM

## 2019-08-10 MED ORDER — BETAMETHASONE DIPROPIONATE AUG 0.05 % EX GEL: CUTANEOUS | 0 refills | Status: DC

## 2019-08-10 MED ORDER — EUCRISA 2 % EX OINT: 1.0000 "application " | TOPICAL_OINTMENT | Freq: Two times a day (BID) | CUTANEOUS | 3 refills | Status: DC

## 2019-08-10 NOTE — Progress Notes (Signed)
   Follow-Up Visit   Subjective  Melissa [name] is a 64 y.o. female who presents for the following: hand dermatitis (improved with TMC 0.1% ointment.), poison oak (patient would like a refill on Diprolene gel for current rash and future exposure to the plant.), and lesion (L eyelid - patient scratched at it and it is white in coloration.).  The following portions of the chart were reviewed this encounter and updated as appropriate:    Review of Systems: No other skin or systemic complaints.  Objective  Well appearing patient in no apparent distress; mood and affect are within normal limits.  A focused examination was performed including B/L hands and face. Relevant physical exam findings are noted in the Assessment and Plan.  Objective  B/L hand: Pinkness and roughness on the hands with some minimal fissure formation.  Objective  L upper eyelid: Tiny white papule  Assessment & Plan  Allergic contact dermatitis due to plants, except food Right Forearm - Anterior  To poison ivy - Continue Diprolene gel to aa's QD-BID PRN rash.   betamethasone, augmented, (DIPROLENE) 0.05 % gel - Right Forearm - Anterior  Hand dermatitis B/L hand Advise of chronic nature.  She is improving but still persistent issues with slight flare.  D/C TMC 0.1% ointment and use with flares.  Start Eucrisa 2% ointment to aa's BID. If flares occur restart TMC until calmed then D/C again. Recommend mild soaps and moisturizers.   Crisaborole (EUCRISA) 2 % OINT - B/L hand  Milium L upper eyelid New problem. Benign, observe.  Discussed treatment option with removal.  Recommend hold off for now and will check on follow-up.   Return in about 1 year (around 08/09/2020) for TBSE.   Tanja Port, MD, am acting as scribe for Sarina Ser, MD .

## 2019-08-26 ENCOUNTER — Telehealth: Payer: Self-pay | Admitting: Internal Medicine

## 2019-08-26 NOTE — Telephone Encounter (Signed)
Please advise, pt has not been seen since 07/2018.

## 2019-08-26 NOTE — Telephone Encounter (Signed)
She can drop form off we charge fee to fill out forms  Once I look at form will determine if she need appt

## 2019-08-26 NOTE — Telephone Encounter (Signed)
Patient informed and verbalized understanding.  Pt asked when she was due for a physical. Informed her that she has not been seen in over a year so that can be scheduled at anytime.

## 2019-08-26 NOTE — Telephone Encounter (Signed)
Pt called and said she has a medical clearance form that need to be filled out for her to be able to go hiking. She said all it needs is a check mark and signature. Pt wants to know if she needs a physical to complete form or can she drop off form to be filled out?

## 2019-08-29 ENCOUNTER — Telehealth: Payer: Self-pay | Admitting: Internal Medicine

## 2019-08-29 NOTE — Telephone Encounter (Signed)
Pt dropped off medical clearance form to be signed. Placed in colored folder up front. Please call to pick up once completed

## 2019-08-30 ENCOUNTER — Other Ambulatory Visit: Payer: Self-pay | Admitting: Internal Medicine

## 2019-08-30 DIAGNOSIS — N3281 Overactive bladder: Secondary | ICD-10-CM

## 2019-08-30 MED ORDER — OXYBUTYNIN CHLORIDE ER 10 MG PO TB24: 10.0000 mg | ORAL_TABLET | Freq: Every day | ORAL | 3 refills | Status: DC

## 2019-09-01 NOTE — Telephone Encounter (Signed)
Form faxed to [phone]

## 2019-09-06 ENCOUNTER — Telehealth: Payer: Self-pay | Admitting: Internal Medicine

## 2019-09-06 ENCOUNTER — Other Ambulatory Visit: Payer: Self-pay | Admitting: Internal Medicine

## 2019-09-06 DIAGNOSIS — E785 Hyperlipidemia, unspecified: Secondary | ICD-10-CM

## 2019-09-06 DIAGNOSIS — I10 Essential (primary) hypertension: Secondary | ICD-10-CM

## 2019-09-06 NOTE — Telephone Encounter (Signed)
Labs in sch fasting lab appt before 09/22/19   Thanks [place]

## 2019-09-06 NOTE — Telephone Encounter (Signed)
Patient is requesting labs before her 5/20 appointment. Could she have labs, if so please put orders in. Thanks

## 2019-09-06 NOTE — Telephone Encounter (Signed)
Please advise 

## 2019-09-07 NOTE — Telephone Encounter (Signed)
Pt scheduled for 5/10 at 8:15 for fasting labs. Patient verbalized understanding

## 2019-09-12 ENCOUNTER — Other Ambulatory Visit: Payer: Self-pay

## 2019-09-12 ENCOUNTER — Other Ambulatory Visit (INDEPENDENT_AMBULATORY_CARE_PROVIDER_SITE_OTHER): Payer: Medicare HMO

## 2019-09-12 DIAGNOSIS — E785 Hyperlipidemia, unspecified: Secondary | ICD-10-CM | POA: Diagnosis not present

## 2019-09-12 DIAGNOSIS — I10 Essential (primary) hypertension: Secondary | ICD-10-CM | POA: Diagnosis not present

## 2019-09-12 LAB — CBC WITH DIFFERENTIAL/PLATELET
Basophils Absolute: 0 10*3/uL (ref 0.0–0.1)
Basophils Relative: 0.5 % (ref 0.0–3.0)
Eosinophils Absolute: 0.1 10*3/uL (ref 0.0–0.7)
Eosinophils Relative: 2.7 % (ref 0.0–5.0)
HCT: 35 % — ABNORMAL LOW (ref 36.0–46.0)
Hemoglobin: 11.8 g/dL — ABNORMAL LOW (ref 12.0–15.0)
Lymphocytes Relative: 31.6 % (ref 12.0–46.0)
Lymphs Abs: 1.5 10*3/uL (ref 0.7–4.0)
MCHC: 33.7 g/dL (ref 30.0–36.0)
MCV: 92.5 fl (ref 78.0–100.0)
Monocytes Absolute: 0.4 10*3/uL (ref 0.1–1.0)
Monocytes Relative: 7.6 % (ref 3.0–12.0)
Neutro Abs: 2.8 10*3/uL (ref 1.4–7.7)
Neutrophils Relative %: 57.6 % (ref 43.0–77.0)
Platelets: 165 10*3/uL (ref 150.0–400.0)
RBC: 3.78 Mil/uL — ABNORMAL LOW (ref 3.87–5.11)
RDW: 13.6 % (ref 11.5–15.5)
WBC: 4.8 10*3/uL (ref 4.0–10.5)

## 2019-09-12 LAB — COMPREHENSIVE METABOLIC PANEL
ALT: 18 U/L (ref 0–35)
AST: 22 U/L (ref 0–37)
Albumin: 4.2 g/dL (ref 3.5–5.2)
Alkaline Phosphatase: 21 U/L — ABNORMAL LOW (ref 39–117)
BUN: 21 mg/dL (ref 6–23)
CO2: 28 mEq/L (ref 19–32)
Calcium: 9.5 mg/dL (ref 8.4–10.5)
Chloride: 106 mEq/L (ref 96–112)
Creatinine, Ser: 0.97 mg/dL (ref 0.40–1.20)
GFR: 57.6 mL/min — ABNORMAL LOW (ref >60.00)
Glucose, Bld: 97 mg/dL (ref 70–99)
Potassium: 4.1 mEq/L (ref 3.5–5.1)
Sodium: 140 mEq/L (ref 135–145)
Total Bilirubin: 0.5 mg/dL (ref 0.2–1.2)
Total Protein: 6.7 g/dL (ref 6.0–8.3)

## 2019-09-12 LAB — LIPID PANEL
Cholesterol: 166 mg/dL (ref 0–200)
HDL: 68.1 mg/dL (ref >39.00)
LDL Cholesterol: 86 mg/dL (ref 0–99)
NonHDL: 97.9
Total CHOL/HDL Ratio: 2
Triglycerides: 62 mg/dL (ref 0.0–149.0)
VLDL: 12.4 mg/dL (ref 0.0–40.0)

## 2019-09-13 NOTE — Telephone Encounter (Signed)
See encounter from 08/26/19.

## 2019-09-22 ENCOUNTER — Ambulatory Visit: Payer: Medicare HMO | Admitting: Internal Medicine

## 2019-10-13 ENCOUNTER — Other Ambulatory Visit: Payer: Self-pay

## 2019-10-13 ENCOUNTER — Encounter: Payer: Self-pay | Admitting: Internal Medicine

## 2019-10-13 ENCOUNTER — Ambulatory Visit (INDEPENDENT_AMBULATORY_CARE_PROVIDER_SITE_OTHER): Payer: Medicare HMO | Admitting: Internal Medicine

## 2019-10-13 VITALS — BP 138/86 | HR 61 | Temp 97.8°F | Ht 64.76 in | Wt 168.1 lb

## 2019-10-13 DIAGNOSIS — H6121 Impacted cerumen, right ear: Secondary | ICD-10-CM | POA: Diagnosis not present

## 2019-10-13 DIAGNOSIS — E785 Hyperlipidemia, unspecified: Secondary | ICD-10-CM

## 2019-10-13 DIAGNOSIS — N3281 Overactive bladder: Secondary | ICD-10-CM

## 2019-10-13 DIAGNOSIS — I10 Essential (primary) hypertension: Secondary | ICD-10-CM

## 2019-10-13 DIAGNOSIS — Z1231 Encounter for screening mammogram for malignant neoplasm of breast: Secondary | ICD-10-CM | POA: Diagnosis not present

## 2019-10-13 DIAGNOSIS — E2839 Other primary ovarian failure: Secondary | ICD-10-CM | POA: Diagnosis not present

## 2019-10-13 DIAGNOSIS — Z23 Encounter for immunization: Secondary | ICD-10-CM

## 2019-10-13 MED ORDER — ATORVASTATIN CALCIUM 10 MG PO TABS: 10.0000 mg | ORAL_TABLET | Freq: Every day | ORAL | 3 refills | Status: DC

## 2019-10-13 MED ORDER — TELMISARTAN 20 MG PO TABS: 20.0000 mg | ORAL_TABLET | Freq: Every day | ORAL | 3 refills | Status: DC

## 2019-10-13 MED ORDER — AMLODIPINE BESYLATE 2.5 MG PO TABS: 2.5000 mg | ORAL_TABLET | Freq: Every day | ORAL | 3 refills | Status: DC

## 2019-10-13 MED ORDER — OXYBUTYNIN CHLORIDE ER 10 MG PO TB24: 10.0000 mg | ORAL_TABLET | Freq: Every day | ORAL | 3 refills | Status: DC

## 2019-10-13 NOTE — Patient Instructions (Addendum)
Check with insurance to see if will cover another pap before 64 y.o  If blood pressure elevated take additional norvasc 2.5 mg daily so up t o2.5 2x per day goal <130/<80  Pneumococcal Conjugate Vaccine suspension for injection What is this medicine? PNEUMOCOCCAL VACCINE (NEU mo KOK al vak SEEN) is a vaccine used to prevent pneumococcus bacterial infections. These bacteria can cause serious infections like pneumonia, meningitis, and blood infections. This vaccine will lower your chance of getting pneumonia. If you do get pneumonia, it can make your symptoms milder and your illness shorter. This vaccine will not treat an infection and will not cause infection. This vaccine is recommended for infants and young children, adults with certain medical conditions, and adults 76 years or older. This medicine may be used for other purposes; ask your health care provider or pharmacist if you have questions. COMMON BRAND NAME(S): Prevnar, Prevnar 13 What should I tell my health care provider before I take this medicine? They need to know if you have any of these conditions:  bleeding problems  fever  immune system problems  an unusual or allergic reaction to pneumococcal vaccine, diphtheria toxoid, other vaccines, latex, other medicines, foods, dyes, or preservatives  pregnant or trying to get pregnant  breast-feeding How should I use this medicine? This vaccine is for injection into a muscle. It is given by a health care professional. A copy of Vaccine Information Statements will be given before each vaccination. Read this sheet carefully each time. The sheet may change frequently. Talk to your pediatrician regarding the use of this medicine in children. While this drug may be prescribed for children as young as 18 weeks old for selected conditions, precautions do apply. Overdosage: If you think you have taken too much of this medicine contact a poison control center or emergency room at once. NOTE:  This medicine is only for you. Do not share this medicine with others. What if I miss a dose? It is important not to miss your dose. Call your doctor or health care professional if you are unable to keep an appointment. What may interact with this medicine?  medicines for cancer chemotherapy  medicines that suppress your immune function  steroid medicines like prednisone or cortisone This list may not describe all possible interactions. Give your health care provider a list of all the medicines, herbs, non-prescription drugs, or dietary supplements you use. Also tell them if you smoke, drink alcohol, or use illegal drugs. Some items may interact with your medicine. What should I watch for while using this medicine? Mild fever and pain should go away in 3 days or less. Report any unusual symptoms to your doctor or health care professional. What side effects may I notice from receiving this medicine? Side effects that you should report to your doctor or health care professional as soon as possible:  allergic reactions like skin rash, itching or hives, swelling of the face, lips, or tongue  breathing problems  confused  fast or irregular heartbeat  fever over 102 degrees F  seizures  unusual bleeding or bruising  unusual muscle weakness Side effects that usually do not require medical attention (report to your doctor or health care professional if they continue or are bothersome):  aches and pains  diarrhea  fever of 102 degrees F or less  headache  irritable  loss of appetite  pain, tender at site where injected  trouble sleeping This list may not describe all possible side effects. Call your doctor for  medical advice about side effects. You may report side effects to FDA at 1-800-FDA-1088. Where should I keep my medicine? This does not apply. This vaccine is given in a clinic, pharmacy, doctor's office, or other health care setting and will not be stored at  home. NOTE: This sheet is a summary. It may not cover all possible information. If you have questions about this medicine, talk to your doctor, pharmacist, or health care provider.  2020 Elsevier/Gold Standard (2014-01-26 10:27:27)

## 2019-10-13 NOTE — Progress Notes (Signed)
Chief Complaint  Patient presents with  . Annual Exam   F/u  1. HTN elevated on norvasc 2.5 and micardis 20 mg qd reviewed labs 09/12/19 2. Right ear like bug in ear or something on skin feels sensation in front of ear and inside ear denies hearing loss she does have h/o tinnitis and notices with increase stress/exercise or elevated BP denies room spinning or dizziness  3. Right knee popping w/o pain will call back when ready for Xray    Review of Systems  Constitutional: Negative for weight loss.  HENT: Negative for hearing loss and tinnitus.   Eyes: Negative for blurred vision.  Respiratory: Negative for shortness of breath.   Cardiovascular: Negative for chest pain.  Gastrointestinal: Negative for abdominal pain.  Musculoskeletal: Negative for falls.  Skin: Negative for rash.  Neurological: Negative for headaches.  Psychiatric/Behavioral: Negative for depression and memory loss.   Past Medical History:  Diagnosis Date  . Actinic keratoses   . Allergy   . Chicken pox   . Colon polyps   . Dermatitis   . Heart murmur    youth  . Hyperlipidemia   . Hypertension   . Obesity   . Urgency incontinence    Past Surgical History:  Procedure Laterality Date  . BUNIONECTOMY Bilateral   . COLONOSCOPY    . COLONOSCOPY WITH PROPOFOL N/A 04/18/2016   Procedure: COLONOSCOPY WITH PROPOFOL;  Surgeon: Manya Silvas, MD;  Location[facility] ENDOSCOPY;  Service: Endoscopy;  Laterality: N/A;  . TONSILLECTOMY AND ADENOIDECTOMY     Family History  Problem Relation Age of Onset  . Arthritis Mother   . Diabetes Mother   . Stroke Mother   . Arthritis Father   . Diabetes Father   . Early death Father   . Heart disease Father        MI 75 y.o   . Diabetes Sister   . Breast cancer Neg Hx    Social History   Socioeconomic History  . Marital status: Married    Spouse name: Not on file  . Number of children: Not on file  . Years of education: Not on file  . Highest education level: Not  on file  Occupational History  . Not on file  Tobacco Use  . Smoking status: Never Smoker  . Smokeless tobacco: Never Used  Substance and Sexual Activity  . Alcohol use: Not on file    Comment: occasionally wine   . Drug use: No  . Sexual activity: Never  Other Topics Concern  . Not on file  Social History Narrative   Married    1 daughter age 76 as of 03/2017    Retired   Social Determinants of Radio broadcast assistant Strain:   . Difficulty of Paying Living Expenses:   Food Insecurity:   . Worried About Charity fundraiser in the Last Year:   . Arboriculturist in the Last Year:   Transportation Needs:   . Film/video editor (Medical):   Marland Kitchen Lack of Transportation (Non-Medical):   Physical Activity:   . Days of Exercise per Week:   . Minutes of Exercise per Session:   Stress:   . Feeling of Stress :   Social Connections:   . Frequency of Communication with Friends and Family:   . Frequency of Social Gatherings with Friends and Family:   . Attends Religious Services:   . Active Member of Clubs or Organizations:   . Attends  Club or Organization Meetings:   Marland Kitchen Marital Status:   Intimate Partner Violence:   . Fear of Current or Ex-Partner:   . Emotionally Abused:   Marland Kitchen Physically Abused:   . Sexually Abused:    Current Meds  Medication Sig  . amLODipine (NORVASC) 2.5 MG tablet Take 1 tablet (2.5 mg total) by mouth daily.  Marland Kitchen aspirin EC 81 MG tablet Take 81 mg by mouth daily.  Marland Kitchen atorvastatin (LIPITOR) 10 MG tablet Take 1 tablet (10 mg total) by mouth daily at 6 PM.  . Calcium-Magnesium-Vitamin D (CALCIUM 1200+D3 PO) Take by mouth 2 (two) times daily.  Stasia Cavalier (EUCRISA) 2 % OINT Apply 1 application topically in the morning and at bedtime.  . docusate sodium (COLACE) 50 MG capsule Take 50 mg by mouth daily.  . Multiple Vitamin (MULTI-VITAMINS) TABS Take by mouth.  . oxybutynin (DITROPAN-XL) 10 MG 24 hr tablet Take 1 tablet (10 mg total) by mouth at bedtime.  .  sennosides-docusate sodium (SENOKOT-S) 8.6-50 MG tablet Take 1 tablet by mouth daily.  Marland Kitchen telmisartan (MICARDIS) 20 MG tablet Take 1 tablet (20 mg total) by mouth daily.  Marland Kitchen triamcinolone ointment (KENALOG) 0.1 % Apply 1 application topically 2 (two) times daily as needed.   Allergies  Allergen Reactions  . Terfenadine Rash   Recent Results (from the past 2160 hour(s))  CBC with Differential/Platelet     Status: Abnormal   Collection Time: 09/12/19  8:11 AM  Result Value Ref Range   WBC 4.8 4.0 - 10.5 K/uL   RBC 3.78 (L) 3.87 - 5.11 Mil/uL   Hemoglobin 11.8 (L) 12.0 - 15.0 g/dL   HCT 35.0 (L) 36 - 46 %   MCV 92.5 78.0 - 100.0 fl   MCHC 33.7 30.0 - 36.0 g/dL   RDW 13.6 11.5 - 15.5 %   Platelets 165.0 150 - 400 K/uL   Neutrophils Relative % 57.6 43 - 77 %   Lymphocytes Relative 31.6 12 - 46 %   Monocytes Relative 7.6 3 - 12 %   Eosinophils Relative 2.7 0 - 5 %   Basophils Relative 0.5 0 - 3 %   Neutro Abs 2.8 1.4 - 7.7 K/uL   Lymphs Abs 1.5 0.7 - 4.0 K/uL   Monocytes Absolute 0.4 0 - 1 K/uL   Eosinophils Absolute 0.1 0 - 0 K/uL   Basophils Absolute 0.0 0 - 0 K/uL  Lipid panel     Status: None   Collection Time: 09/12/19  8:11 AM  Result Value Ref Range   Cholesterol 166 0 - 200 mg/dL    Comment: ATP III Classification       Desirable:  < 200 mg/dL               Borderline High:  200 - 239 mg/dL          High:  > = 240 mg/dL   Triglycerides 62.0 0 - 149 mg/dL    Comment: Normal:  <150 mg/dLBorderline High:  150 - 199 mg/dL   HDL 68.10 >39.00 mg/dL   VLDL 12.4 0.0 - 40.0 mg/dL   LDL Cholesterol 86 0 - 99 mg/dL   Total CHOL/HDL Ratio 2     Comment:                Men          Women1/2 Average Risk     3.4          3.3Average Risk  5.0          4.42X Average Risk          9.6          7.13X Average Risk          15.0          11.0                       NonHDL 97.90     Comment: NOTE:  Non-HDL goal should be 30 mg/dL higher than patient's LDL goal (i.e. LDL goal of < 70  mg/dL, would have non-HDL goal of < 100 mg/dL)  Comprehensive metabolic panel     Status: Abnormal   Collection Time: 09/12/19  8:11 AM  Result Value Ref Range   Sodium 140 135 - 145 mEq/L   Potassium 4.1 3.5 - 5.1 mEq/L   Chloride 106 96 - 112 mEq/L   CO2 28 19 - 32 mEq/L   Glucose, Bld 97 70 - 99 mg/dL   BUN 21 6 - 23 mg/dL   Creatinine, Ser 0.97 0.40 - 1.20 mg/dL   Total Bilirubin 0.5 0.2 - 1.2 mg/dL   Alkaline Phosphatase 21 (L) 39 - 117 U/L   AST 22 0 - 37 U/L   ALT 18 0 - 35 U/L   Total Protein 6.7 6.0 - 8.3 g/dL   Albumin 4.2 3.5 - 5.2 g/dL   GFR 57.60 (L) >60.00 mL/min   Calcium 9.5 8.4 - 10.5 mg/dL   Objective  Body mass index is 28.18 kg/m. Wt Readings from Last 3 Encounters:  10/13/19 168 lb 1.9 oz (76.3 kg)  07/23/18 167 lb 12.8 oz (76.1 kg)  05/07/18 168 lb 9.6 oz (76.5 kg)   Temp Readings from Last 3 Encounters:  10/13/19 97.8 F (36.6 C) (Temporal)  07/23/18 97.9 F (36.6 C) (Oral)  05/07/18 98.2 F (36.8 C) (Oral)   BP Readings from Last 3 Encounters:  10/13/19 138/86  07/23/18 110/80  05/07/18 136/90   Pulse Readings from Last 3 Encounters:  10/13/19 61  07/23/18 78  05/07/18 69    Physical Exam Vitals and nursing note reviewed.  Constitutional:      Appearance: Normal appearance. She is well-developed and well-groomed.  HENT:     Head: Normocephalic and atraumatic.     Right Ear: There is impacted cerumen.  Eyes:     Conjunctiva/sclera: Conjunctivae normal.     Pupils: Pupils are equal, round, and reactive to light.  Cardiovascular:     Rate and Rhythm: Normal rate and regular rhythm.     Heart sounds: Normal heart sounds. No murmur heard.   Pulmonary:     Effort: Pulmonary effort is normal.     Breath sounds: Normal breath sounds.  Chest:     Breasts: Breasts are symmetrical.        Right: Normal.        Left: Normal.  Lymphadenopathy:     Upper Body:     Right upper body: No axillary adenopathy.     Left upper body: No  axillary adenopathy.  Skin:    General: Skin is warm and dry.  Neurological:     General: No focal deficit present.     Mental Status: She is alert and oriented to person, place, and time. Mental status is at baseline.     Gait: Gait normal.  Psychiatric:        Attention and Perception: Attention and perception  normal.        Mood and Affect: Mood and affect normal.        Speech: Speech normal.        Behavior: Behavior normal. Behavior is cooperative.        Thought Content: Thought content normal.        Cognition and Memory: Cognition and memory normal.        Judgment: Judgment normal.     Assessment  Plan  Essential hypertension Cont meds   Impacted cerumen of right ear  Consented and lavage today with ear wax removal complete   HM Flushotutd Tdap1/25/19 shingrix 2/2  covid 2/2 prevnar given today --> pna 23 due in future   Never smoker immuneMMR  Colonoscopy 04/18/16 IH, diverticulosis f/u in 5 years   Paphad 04/03/17 f/u in 3 years check w/ insurance if covered mammo 05/26/19 normal, ordered  Consider DEXA age 64ordered F/u Dr. Melvern Sample saw 6/6/19and 2020 ln2 to face and chest  Seen 08/10/19 f/u 1 year  Call back if wants right knee Xray  Provider: Dr. Olivia Mackie McLean-Scocuzza-[facility] Medicine

## 2019-12-07 ENCOUNTER — Telehealth: Payer: Self-pay | Admitting: Internal Medicine

## 2019-12-07 NOTE — Telephone Encounter (Signed)
Patient called and said her pharmacy sent over a request for a refill on her oxybutynin (DITROPAN-XL) 10 MG 24 hr tablet, but the pharmacy wanted to know if the can refill with Trospiun.

## 2019-12-09 ENCOUNTER — Other Ambulatory Visit: Payer: Self-pay | Admitting: Internal Medicine

## 2019-12-09 DIAGNOSIS — N3281 Overactive bladder: Secondary | ICD-10-CM

## 2019-12-09 MED ORDER — OXYBUTYNIN CHLORIDE ER 10 MG PO TB24: 10.0000 mg | ORAL_TABLET | Freq: Every day | ORAL | 3 refills | Status: DC

## 2019-12-28 DIAGNOSIS — R69 Illness, unspecified: Secondary | ICD-10-CM | POA: Diagnosis not present

## 2020-01-12 ENCOUNTER — Encounter: Payer: Self-pay | Admitting: Internal Medicine

## 2020-02-02 ENCOUNTER — Encounter: Payer: Self-pay | Admitting: Internal Medicine

## 2020-02-02 ENCOUNTER — Other Ambulatory Visit: Payer: Self-pay

## 2020-02-03 ENCOUNTER — Other Ambulatory Visit: Payer: Self-pay | Admitting: Internal Medicine

## 2020-02-03 ENCOUNTER — Encounter: Payer: Self-pay | Admitting: Nurse Practitioner

## 2020-02-03 ENCOUNTER — Ambulatory Visit (INDEPENDENT_AMBULATORY_CARE_PROVIDER_SITE_OTHER): Payer: Medicare HMO | Admitting: Nurse Practitioner

## 2020-02-03 VITALS — BP 126/82 | HR 74 | Temp 98.0°F | Ht 65.12 in | Wt 171.2 lb

## 2020-02-03 DIAGNOSIS — N3000 Acute cystitis without hematuria: Secondary | ICD-10-CM

## 2020-02-03 DIAGNOSIS — R102 Pelvic and perineal pain: Secondary | ICD-10-CM | POA: Insufficient documentation

## 2020-02-03 DIAGNOSIS — R300 Dysuria: Secondary | ICD-10-CM | POA: Diagnosis not present

## 2020-02-03 DIAGNOSIS — R310 Gross hematuria: Secondary | ICD-10-CM | POA: Diagnosis not present

## 2020-02-03 DIAGNOSIS — R309 Painful micturition, unspecified: Secondary | ICD-10-CM

## 2020-02-03 LAB — URINALYSIS, ROUTINE W REFLEX MICROSCOPIC
Bilirubin Urine: NEGATIVE
Ketones, ur: NEGATIVE
Nitrite: NEGATIVE
Specific Gravity, Urine: 1.015 (ref 1.000–1.030)
Total Protein, Urine: 30 — AB
Urine Glucose: NEGATIVE
Urobilinogen, UA: 0.2 (ref 0.0–1.0)
pH: 7 (ref 5.0–8.0)

## 2020-02-03 LAB — POCT URINALYSIS DIPSTICK
Bilirubin, UA: NEGATIVE
Glucose, UA: NEGATIVE
Ketones, UA: NEGATIVE
Nitrite, UA: NEGATIVE
Protein, UA: POSITIVE — AB
Spec Grav, UA: 1.02 (ref 1.010–1.025)
Urobilinogen, UA: 0.2 E.U./dL
pH, UA: 7 (ref 5.0–8.0)

## 2020-02-03 LAB — CBC WITH DIFFERENTIAL/PLATELET
Basophils Absolute: 0 10*3/uL (ref 0.0–0.1)
Basophils Relative: 0.3 % (ref 0.0–3.0)
Eosinophils Absolute: 0 10*3/uL (ref 0.0–0.7)
Eosinophils Relative: 0.5 % (ref 0.0–5.0)
HCT: 35.3 % — ABNORMAL LOW (ref 36.0–46.0)
Hemoglobin: 11.6 g/dL — ABNORMAL LOW (ref 12.0–15.0)
Lymphocytes Relative: 17.6 % (ref 12.0–46.0)
Lymphs Abs: 1.5 10*3/uL (ref 0.7–4.0)
MCHC: 33 g/dL (ref 30.0–36.0)
MCV: 92.3 fl (ref 78.0–100.0)
Monocytes Absolute: 0.6 10*3/uL (ref 0.1–1.0)
Monocytes Relative: 7.2 % (ref 3.0–12.0)
Neutro Abs: 6.4 10*3/uL (ref 1.4–7.7)
Neutrophils Relative %: 74.4 % (ref 43.0–77.0)
Platelets: 182 10*3/uL (ref 150.0–400.0)
RBC: 3.82 Mil/uL — ABNORMAL LOW (ref 3.87–5.11)
RDW: 13.2 % (ref 11.5–15.5)
WBC: 8.6 10*3/uL (ref 4.0–10.5)

## 2020-02-03 LAB — BASIC METABOLIC PANEL
BUN: 18 mg/dL (ref 6–23)
CO2: 28 mEq/L (ref 19–32)
Calcium: 10 mg/dL (ref 8.4–10.5)
Chloride: 103 mEq/L (ref 96–112)
Creatinine, Ser: 1.03 mg/dL (ref 0.40–1.20)
GFR: 53.68 mL/min — ABNORMAL LOW (ref >60.00)
Glucose, Bld: 96 mg/dL (ref 70–99)
Potassium: 3.9 mEq/L (ref 3.5–5.1)
Sodium: 141 mEq/L (ref 135–145)

## 2020-02-03 MED ORDER — CIPROFLOXACIN HCL 250 MG PO TABS: 250.0000 mg | ORAL_TABLET | Freq: Two times a day (BID) | ORAL | 0 refills | Status: DC

## 2020-02-03 NOTE — Progress Notes (Signed)
Established Patient Office Visit  Subjective:  Patient ID: Melissa [name], female    DOB: 04/10/1956  Age: 64 y.o. MRN: [id number]  CC:  Chief Complaint  Patient presents with  . Cystitis    pt c/o urinary urgency, blood, frequency, and pain x1day    HPI Melissa [name] presents for onset yesterday of urgency frequency of the urination.  Last night, she had less frequency but more burning.  She also has a slight to lower abdominal pressure feeling today.  She had a temperature of 100.  No nausea vomiting.  She has noted a left lower backache but has been exercising more aggressively and hurts when she twists.  She thinks it  is muscular.  However, she think she has a urinary tract infection. She does not feel ill today.   Past Medical History:  Diagnosis Date  . Actinic keratoses   . Allergy   . Chicken pox   . Colon polyps   . Dermatitis   . Heart murmur    youth  . Hyperlipidemia   . Hypertension   . Obesity   . Urgency incontinence     Past Surgical History:  Procedure Laterality Date  . BUNIONECTOMY Bilateral   . COLONOSCOPY    . COLONOSCOPY WITH PROPOFOL N/A 04/18/2016   Procedure: COLONOSCOPY WITH PROPOFOL;  Surgeon: Manya Silvas, MD;  Location: [facility] ENDOSCOPY;  Service: Endoscopy;  Laterality: N/A;  . TONSILLECTOMY AND ADENOIDECTOMY      Family History  Problem Relation Age of Onset  . Arthritis Mother   . Diabetes Mother   . Stroke Mother   . Arthritis Father   . Diabetes Father   . Early death Father   . Heart disease Father        MI 4 y.o   . Diabetes Sister   . Breast cancer Neg Hx     Social History   Socioeconomic History  . Marital status: Married    Spouse name: Not on file  . Number of children: Not on file  . Years of education: Not on file  . Highest education level: Not on file  Occupational History  . Not on file  Tobacco Use  . Smoking status: Never Smoker  . Smokeless tobacco: Never Used  Substance and Sexual Activity    . Alcohol use: Not on file    Comment: occasionally wine   . Drug use: No  . Sexual activity: Never  Other Topics Concern  . Not on file  Social History Narrative   Married    1 daughter age 58 as of 03/2017    Retired   Social Determinants of Radio broadcast assistant Strain:   . Difficulty of Paying Living Expenses: Not on file  Food Insecurity:   . Worried About Charity fundraiser in the Last Year: Not on file  . Ran Out of Food in the Last Year: Not on file  Transportation Needs:   . Lack of Transportation (Medical): Not on file  . Lack of Transportation (Non-Medical): Not on file  Physical Activity:   . Days of Exercise per Week: Not on file  . Minutes of Exercise per Session: Not on file  Stress:   . Feeling of Stress : Not on file  Social Connections:   . Frequency of Communication with Friends and Family: Not on file  . Frequency of Social Gatherings with Friends and Family: Not on file  . Attends Religious Services: Not  on file  . Active Member of Clubs or Organizations: Not on file  . Attends Archivist Meetings: Not on file  . Marital Status: Not on file  Intimate Partner Violence:   . Fear of Current or Ex-Partner: Not on file  . Emotionally Abused: Not on file  . Physically Abused: Not on file  . Sexually Abused: Not on file    Outpatient Medications Prior to Visit  Medication Sig Dispense Refill  . amLODipine (NORVASC) 2.5 MG tablet Take 1 tablet (2.5 mg total) by mouth daily. Can take additional dose if BP >130/>80 180 tablet 3  . aspirin EC 81 MG tablet Take 81 mg by mouth daily.    Marland Kitchen atorvastatin (LIPITOR) 10 MG tablet Take 1 tablet (10 mg total) by mouth daily at 6 PM. 90 tablet 3  . betamethasone, augmented, (DIPROLENE) 0.05 % gel Apply to aa's rash BID PRN 30 g 0  . Calcium-Magnesium-Vitamin D (CALCIUM 1200+D3 PO) Take by mouth 2 (two) times daily.    Stasia Cavalier (EUCRISA) 2 % OINT Apply 1 application topically in the morning and at  bedtime. 60 g 3  . docusate sodium (COLACE) 50 MG capsule Take 50 mg by mouth daily.    . Multiple Vitamin (MULTI-VITAMINS) TABS Take by mouth.    . oxybutynin (DITROPAN-XL) 10 MG 24 hr tablet Take 1 tablet (10 mg total) by mouth at bedtime. 90 tablet 3  . sennosides-docusate sodium (SENOKOT-S) 8.6-50 MG tablet Take 1 tablet by mouth daily.    Marland Kitchen telmisartan (MICARDIS) 20 MG tablet Take 1 tablet (20 mg total) by mouth daily. 90 tablet 3  . triamcinolone ointment (KENALOG) 0.1 % Apply 1 application topically 2 (two) times daily as needed.     No facility-administered medications prior to visit.    Allergies  Allergen Reactions  . Terfenadine Rash    Review of Systems Pertinent positives as noted in history of present illness.   Objective:    Physical Exam Vitals reviewed.  Constitutional:      Appearance: Normal appearance.  Cardiovascular:     Rate and Rhythm: Normal rate and regular rhythm.     Pulses: Normal pulses.     Heart sounds: Normal heart sounds.  Pulmonary:     Effort: Pulmonary effort is normal.     Breath sounds: Normal breath sounds.  Abdominal:     Palpations: Abdomen is soft.     Tenderness: There is no abdominal tenderness. There is no right CVA tenderness or left CVA tenderness.     Comments: Mild supra pubic pressure sensation with palpation.   Musculoskeletal:        General: Normal range of motion.  Skin:    General: Skin is warm and dry.  Neurological:     Mental Status: She is alert.     BP 126/82 (BP Location: Left Arm, Patient Position: Sitting)   Pulse 74   Temp 98 F (36.7 C)   Ht 5' 5.12" (1.654 m)   Wt 171 lb 3.2 oz (77.7 kg)   SpO2 97%   BMI 28.39 kg/m  Wt Readings from Last 3 Encounters:  02/03/20 171 lb 3.2 oz (77.7 kg)  10/13/19 168 lb 1.9 oz (76.3 kg)  07/23/18 167 lb 12.8 oz (76.1 kg)   Pulse Readings from Last 3 Encounters:  02/03/20 74  10/13/19 61  07/23/18 78    BP Readings from Last 3 Encounters:  02/03/20 126/82    10/13/19 138/86  07/23/18 110/80  Lab Results  Component Value Date   CHOL 166 09/12/2019   HDL 68.10 09/12/2019   LDLCALC 86 09/12/2019   TRIG 62.0 09/12/2019   CHOLHDL 2 09/12/2019      Health Maintenance Due  Topic Date Due  . HIV Screening  Never done  . DEXA SCAN  Never done  . INFLUENZA VACCINE  12/04/2019  . PAP SMEAR-Modifier  04/03/2020    There are no preventive care reminders to display for this patient.  Lab Results  Component Value Date   TSH 1.71 02/16/2019   Lab Results  Component Value Date   WBC 4.8 09/12/2019   HGB 11.8 (L) 09/12/2019   HCT 35.0 (L) 09/12/2019   MCV 92.5 09/12/2019   PLT 165.0 09/12/2019   Lab Results  Component Value Date   NA 140 09/12/2019   K 4.1 09/12/2019   CO2 28 09/12/2019   GLUCOSE 97 09/12/2019   BUN 21 09/12/2019   CREATININE 0.97 09/12/2019   BILITOT 0.5 09/12/2019   ALKPHOS 21 (L) 09/12/2019   AST 22 09/12/2019   ALT 18 09/12/2019   PROT 6.7 09/12/2019   ALBUMIN 4.2 09/12/2019   CALCIUM 9.5 09/12/2019   GFR 57.60 (L) 09/12/2019   Lab Results  Component Value Date   CHOL 166 09/12/2019   Lab Results  Component Value Date   HDL 68.10 09/12/2019   Lab Results  Component Value Date   LDLCALC 86 09/12/2019   Lab Results  Component Value Date   TRIG 62.0 09/12/2019   Lab Results  Component Value Date   CHOLHDL 2 09/12/2019   Lab Results  Component Value Date   HGBA1C 5.7 02/05/2018      Assessment & Plan:   Problem List Items Addressed This Visit      Other   Dysuria - Primary   Relevant Orders   CBC with Differential/Platelet   Urinalysis, Routine w reflex microscopic   Urine Culture   Basic metabolic panel   Pelvic pressure in female   Relevant Orders   CBC with Differential/Platelet   Basic metabolic panel    Other Visit Diagnoses    Urinary pain       Relevant Orders   POCT Urinalysis Dipstick (Completed)      No orders of the defined types were placed in this  encounter. Please go to the lab today.  You have the symptoms of a urinary tract infection in the urine culture will confirm that.  Urine culture will return in about 3 days.  In the meantime, please start on ciprofloxacin to 250 mg 1 tablet twice daily for 3 days.  Further recommendations on Monday as a culture results are known.  Please begin an over-the-counter probiotic such as florastor, Align, to put good bacteria back in the colon.  Hydrate well.  Monitor for high fevers, nausea, vomiting, worsening abdominal pain, and if that occurs please go to the emergency room for further evaluation management.   Follow-up: No follow-ups on file.  This visit occurred during the SARS-CoV-2 public health emergency.  Safety protocols were in place, including screening questions prior to the visit, additional usage of staff PPE, and extensive cleaning of exam room while observing appropriate contact time as indicated for disinfecting solutions.   Denice Paradise, NP

## 2020-02-03 NOTE — Patient Instructions (Addendum)
Please go to the lab today.  You have the symptoms of a urinary tract infection in the urine culture will confirm that.  Urine culture will return in about 3 days.  In the meantime, please start on ciprofloxacin to 250 mg 1 tablet twice daily for 3 days.  Further recommendations on Monday as a culture results are known.  Please begin an over-the-counter probiotic such as florastor, Align, to put good bacteria back in the colon.  Hydrate well.  Monitor for high fevers, nausea, vomiting, worsening abdominal pain, and if that occurs please go to the emergency room for further evaluation management.   Dysuria Dysuria is pain or discomfort while urinating. The pain or discomfort may be felt in the part of your body that drains urine from the bladder (urethra) or in the surrounding tissue of the genitals. The pain may also be felt in the groin area, lower abdomen, or lower back. You may have to urinate frequently or have the sudden feeling that you have to urinate (urgency). Dysuria can affect both men and women, but it is more common in women. Dysuria can be caused by many different things, including:  Urinary tract infection.  Kidney stones or bladder stones.  Certain sexually transmitted infections (STIs), such as chlamydia.  Dehydration.  Inflammation of the tissues of the vagina.  Use of certain medicines.  Use of certain soaps or scented products that cause irritation. Follow these instructions at home: General instructions  Watch your condition for any changes.  Urinate often. Avoid holding urine for long periods of time.  After a bowel movement or urination, women should cleanse from front to back, using each tissue only once.  Urinate after sexual intercourse.  Keep all follow-up visits as told by your health care provider. This is important.  If you had any tests done to find the cause of dysuria, it is up to you to get your test results. Ask your health care provider,  or the department that is doing the test, when your results will be ready. Eating and drinking   Drink enough fluid to keep your urine pale yellow.  Avoid caffeine, tea, and alcohol. They can irritate the bladder and make dysuria worse. In men, alcohol may irritate the prostate. Medicines  Take over-the-counter and prescription medicines only as told by your health care provider.  If you were prescribed an antibiotic medicine, take it as told by your health care provider. Do not stop taking the antibiotic even if you start to feel better. Contact a health care provider if:  You have a fever.  You develop pain in your back or sides.  You have nausea or vomiting.  You have blood in your urine.  You are not urinating as often as you usually do. Get help right away if:  Your pain is severe and not relieved with medicines.  You cannot eat or drink without vomiting.  You are confused.  You have a rapid heartbeat while at rest.  You have shaking or chills.  You feel extremely weak. Summary  Dysuria is pain or discomfort while urinating. Many different conditions can lead to dysuria.  If you have dysuria, you may have to urinate frequently or have the sudden feeling that you have to urinate (urgency).  Watch your condition for any changes. Keep all follow-up visits as told by your health care provider.  Make sure that you urinate often and drink enough fluid to keep your urine pale yellow. This information is not  intended to replace advice given to you by your health care provider. Make sure you discuss any questions you have with your health care provider. Document Revised: 04/03/2017 Document Reviewed: 02/05/2017 Elsevier Patient Education  [place].  Hematuria, Adult Hematuria is blood in the urine. Blood may be visible in the urine, or it may be identified with a test. This condition can be caused by infections of the bladder, urethra, kidney, or prostate.  Other possible causes include:  Kidney stones.  Cancer of the urinary tract.  Too much calcium in the urine.  Conditions that are passed from parent to child (inherited conditions).  Exercise that requires a lot of energy. Infections can usually be treated with medicine, and a kidney stone usually will pass through your urine. If neither of these is the cause of your hematuria, more tests may be needed to identify the cause of your symptoms. It is very important to tell your health care provider about any blood in your urine, even if it is painless or the blood stops without treatment. Blood in the urine, when it happens and then stops and then happens again, can be a symptom of a very serious condition, including cancer. There is no pain in the initial stages of many urinary cancers. Follow these instructions at home: Medicines  Take over-the-counter and prescription medicines only as told by your health care provider.  If you were prescribed an antibiotic medicine, take it as told by your health care provider. Do not stop taking the antibiotic even if you start to feel better. Eating and drinking  Drink enough fluid to keep your urine clear or pale yellow. It is recommended that you drink 3-4 quarts (2.8-3.8 L) a day. If you have been diagnosed with an infection, it is recommended that you drink cranberry juice in addition to large amounts of water.  Avoid caffeine, tea, and carbonated beverages. These tend to irritate the bladder.  Avoid alcohol because it may irritate the prostate (men). General instructions  If you have been diagnosed with a kidney stone, follow your health care provider's instructions about straining your urine to catch the stone.  Empty your bladder often. Avoid holding urine for long periods of time.  If you are female: ? After a bowel movement, wipe from front to back and use each piece of toilet paper only once. ? Empty your bladder before and after  sex.  Pay attention to any changes in your symptoms. Tell your health care provider about any changes or any new symptoms.  It is your responsibility to get your test results. Ask your health care provider, or the department performing the test, when your results will be ready.  Keep all follow-up visits as told by your health care provider. This is important. Contact a health care provider if:  You develop back pain.  You have a fever.  You have nausea or vomiting.  Your symptoms do not improve after 3 days.  Your symptoms get worse. Get help right away if:  You develop severe vomiting and are unable take medicine without vomiting.  You develop severe pain in your back or abdomen even though you are taking medicine.  You pass a large amount of blood in your urine.  You pass blood clots in your urine.  You feel very weak or like you might faint.  You faint. Summary  Hematuria is blood in the urine. It has many possible causes.  It is very important that you tell your  health care provider about any blood in your urine, even if it is painless or the blood stops without treatment.  Take over-the-counter and prescription medicines only as told by your health care provider.  Drink enough fluid to keep your urine clear or pale yellow. This information is not intended to replace advice given to you by your health care provider. Make sure you discuss any questions you have with your health care provider. Document Revised: 09/15/2018 Document Reviewed: 05/24/2016 Elsevier Patient Education  2020 [employer].

## 2020-02-05 LAB — URINE CULTURE
MICRO NUMBER:: 11021191
SPECIMEN QUALITY:: ADEQUATE

## 2020-02-06 ENCOUNTER — Telehealth: Payer: Self-pay | Admitting: Nurse Practitioner

## 2020-02-06 MED ORDER — CIPROFLOXACIN HCL 250 MG PO TABS: 250.0000 mg | ORAL_TABLET | Freq: Two times a day (BID) | ORAL | 0 refills | Status: AC

## 2020-02-06 NOTE — Telephone Encounter (Addendum)
Error.  Melissa [name]

## 2020-02-06 NOTE — Telephone Encounter (Signed)
Thank you for seeing her glad she is better

## 2020-02-06 NOTE — Telephone Encounter (Signed)
Please call her for symptom update. Acute cystitis- E coli- sensitive to Cipro. Thank you.

## 2020-02-06 NOTE — Telephone Encounter (Signed)
I spoke to Melissa [name] and her bladder pressure is just about gone and she feels well. No back pain or fever/chills. She tolerated the Cipro 250 mg twice daily  very well. I will extend the course to 7 days. Advised to take a probiotic for 14 days.  She is in agreement with the plan.

## 2020-03-01 DIAGNOSIS — H04123 Dry eye syndrome of bilateral lacrimal glands: Secondary | ICD-10-CM | POA: Diagnosis not present

## 2020-03-09 DIAGNOSIS — H524 Presbyopia: Secondary | ICD-10-CM | POA: Diagnosis not present

## 2020-03-14 ENCOUNTER — Telehealth: Payer: Self-pay | Admitting: Internal Medicine

## 2020-03-14 NOTE — Telephone Encounter (Signed)
Received medication adherence report atorvastatin reported overdue last fill date 03/03/20.

## 2020-03-20 ENCOUNTER — Encounter: Payer: Self-pay | Admitting: Internal Medicine

## 2020-03-22 ENCOUNTER — Other Ambulatory Visit: Payer: Self-pay | Admitting: Internal Medicine

## 2020-03-23 ENCOUNTER — Other Ambulatory Visit: Payer: Self-pay | Admitting: Internal Medicine

## 2020-03-23 DIAGNOSIS — N3281 Overactive bladder: Secondary | ICD-10-CM

## 2020-03-23 MED ORDER — TROSPIUM CHLORIDE ER 60 MG PO CP24: 1.0000 | ORAL_CAPSULE | Freq: Every day | ORAL | 3 refills | Status: DC

## 2020-06-11 ENCOUNTER — Other Ambulatory Visit: Payer: Self-pay

## 2020-06-11 ENCOUNTER — Ambulatory Visit
Admission: RE | Admit: 2020-06-11 | Discharge: 2020-06-11 | Disposition: A | Discharge status: Home / Self Care | Payer: Medicare HMO | Source: Ambulatory Visit | Attending: Internal Medicine | Admitting: Internal Medicine

## 2020-06-11 DIAGNOSIS — E2839 Other primary ovarian failure: Secondary | ICD-10-CM

## 2020-06-11 DIAGNOSIS — Z780 Asymptomatic menopausal state: Secondary | ICD-10-CM | POA: Diagnosis not present

## 2020-06-11 DIAGNOSIS — Z1231 Encounter for screening mammogram for malignant neoplasm of breast: Secondary | ICD-10-CM

## 2020-08-15 ENCOUNTER — Other Ambulatory Visit: Payer: Self-pay

## 2020-08-15 ENCOUNTER — Ambulatory Visit: Payer: Medicare HMO | Admitting: Dermatology

## 2020-08-15 DIAGNOSIS — L578 Other skin changes due to chronic exposure to nonionizing radiation: Secondary | ICD-10-CM

## 2020-08-15 DIAGNOSIS — Z1283 Encounter for screening for malignant neoplasm of skin: Secondary | ICD-10-CM | POA: Diagnosis not present

## 2020-08-15 DIAGNOSIS — L570 Actinic keratosis: Secondary | ICD-10-CM | POA: Diagnosis not present

## 2020-08-15 DIAGNOSIS — L814 Other melanin hyperpigmentation: Secondary | ICD-10-CM | POA: Diagnosis not present

## 2020-08-15 DIAGNOSIS — L237 Allergic contact dermatitis due to plants, except food: Secondary | ICD-10-CM

## 2020-08-15 DIAGNOSIS — D1800 Hemangioma unspecified site: Secondary | ICD-10-CM | POA: Diagnosis not present

## 2020-08-15 DIAGNOSIS — D229 Melanocytic nevi, unspecified: Secondary | ICD-10-CM | POA: Diagnosis not present

## 2020-08-15 DIAGNOSIS — L821 Other seborrheic keratosis: Secondary | ICD-10-CM

## 2020-08-15 NOTE — Progress Notes (Signed)
   Follow-Up Visit   Subjective  Melissa [name] is a 65 y.o. female who presents for the following: Annual Exam (Mole check). Hx of Aks. Pt c/o red scaly patches on her face. Also with poison ivy rash - wants treatment. The patient presents for Total-Body Skin Exam (TBSE) for skin cancer screening and mole check.  The following portions of the chart were reviewed this encounter and updated as appropriate:   Tobacco  Allergies  Meds  Problems  Med Hx  Surg Hx  Fam Hx     Review of Systems:  No other skin or systemic complaints except as noted in HPI or Assessment and Plan.  Objective  Well appearing patient in no apparent distress; mood and affect are within normal limits.  A full examination was performed including scalp, head, eyes, ears, nose, lips, neck, chest, axillae, abdomen, back, buttocks, bilateral upper extremities, bilateral lower extremities, hands, feet, fingers, toes, fingernails, and toenails. All findings within normal limits unless otherwise noted below.  Objective  left forehead (3): Erythematous thin papules/macules with gritty scale.   Objective  Left Lower Leg - Anterior: Scaly erythematous papules and plaques +/- edema and vesiculation.    Assessment & Plan  AK (actinic keratosis) (3) left forehead Destruction of lesion - left forehead Complexity: simple   Destruction method: cryotherapy   Informed consent: discussed and consent obtained   Timeout:  patient name, date of birth, surgical site, and procedure verified Lesion destroyed using liquid nitrogen: Yes   Region frozen until ice ball extended beyond lesion: Yes   Outcome: patient tolerated procedure well with no complications   Post-procedure details: wound care instructions given    Allergic contact dermatitis due to plants, except food Left Lower Leg - Anterior Contact dermatitis/Poison ivy  Cont Diprolene cream qd-bid  Other Related Medications betamethasone, augmented, (DIPROLENE)  0.05 % gel   Lentigines - Scattered tan macules - Due to sun exposure - Benign-appering, observe - Recommend daily broad spectrum sunscreen SPF 30+ to sun-exposed areas, reapply every 2 hours as needed. - Call for any changes  Seborrheic Keratoses - Stuck-on, waxy, tan-brown papules and/or plaques  - Benign-appearing - Discussed benign etiology and prognosis. - Observe - Call for any changes  Melanocytic Nevi - Tan-brown and/or pink-flesh-colored symmetric macules and papules - Benign appearing on exam today - Observation - Call clinic for new or changing moles - Recommend daily use of broad spectrum spf 30+ sunscreen to sun-exposed areas.   Hemangiomas - Red papules - Discussed benign nature - Observe - Call for any changes  Actinic Damage - Chronic condition, secondary to cumulative UV/sun exposure - diffuse scaly erythematous macules with underlying dyspigmentation - Recommend daily broad spectrum sunscreen SPF 30+ to sun-exposed areas, reapply every 2 hours as needed.  - Staying in the shade or wearing long sleeves, sun glasses (UVA+UVB protection) and wide brim hats (4-inch brim around the entire circumference of the hat) are also recommended for sun protection.  - Call for new or changing lesions.  Skin cancer screening performed today.  Return in about 1 year (around 08/15/2021) for TBSE .  IMarye Round, CMA, am acting as scribe for Sarina Ser, MD .  Documentation: I have reviewed the above documentation for accuracy and completeness, and I agree with the above.  Sarina Ser, MD

## 2020-08-15 NOTE — Patient Instructions (Addendum)

## 2020-08-16 ENCOUNTER — Encounter: Payer: Self-pay | Admitting: Dermatology

## 2020-08-20 DIAGNOSIS — E785 Hyperlipidemia, unspecified: Secondary | ICD-10-CM | POA: Diagnosis not present

## 2020-08-20 DIAGNOSIS — Z8249 Family history of ischemic heart disease and other diseases of the circulatory system: Secondary | ICD-10-CM | POA: Diagnosis not present

## 2020-08-20 DIAGNOSIS — Z833 Family history of diabetes mellitus: Secondary | ICD-10-CM | POA: Diagnosis not present

## 2020-08-20 DIAGNOSIS — I10 Essential (primary) hypertension: Secondary | ICD-10-CM | POA: Diagnosis not present

## 2020-08-20 DIAGNOSIS — R32 Unspecified urinary incontinence: Secondary | ICD-10-CM | POA: Diagnosis not present

## 2020-08-20 DIAGNOSIS — Z7982 Long term (current) use of aspirin: Secondary | ICD-10-CM | POA: Diagnosis not present

## 2020-09-12 ENCOUNTER — Encounter: Payer: Self-pay | Admitting: Internal Medicine

## 2020-09-12 ENCOUNTER — Ambulatory Visit (INDEPENDENT_AMBULATORY_CARE_PROVIDER_SITE_OTHER): Payer: Medicare HMO | Admitting: Internal Medicine

## 2020-09-12 ENCOUNTER — Other Ambulatory Visit: Payer: Self-pay

## 2020-09-12 VITALS — BP 116/84 | HR 78 | Temp 97.4°F | Ht 65.12 in | Wt 171.6 lb

## 2020-09-12 DIAGNOSIS — Z0000 Encounter for general adult medical examination without abnormal findings: Secondary | ICD-10-CM | POA: Diagnosis not present

## 2020-09-12 DIAGNOSIS — N3000 Acute cystitis without hematuria: Secondary | ICD-10-CM

## 2020-09-12 DIAGNOSIS — R7303 Prediabetes: Secondary | ICD-10-CM | POA: Diagnosis not present

## 2020-09-12 DIAGNOSIS — Z1329 Encounter for screening for other suspected endocrine disorder: Secondary | ICD-10-CM

## 2020-09-12 DIAGNOSIS — I10 Essential (primary) hypertension: Secondary | ICD-10-CM | POA: Diagnosis not present

## 2020-09-12 DIAGNOSIS — Z1231 Encounter for screening mammogram for malignant neoplasm of breast: Secondary | ICD-10-CM

## 2020-09-12 DIAGNOSIS — M47816 Spondylosis without myelopathy or radiculopathy, lumbar region: Secondary | ICD-10-CM | POA: Insufficient documentation

## 2020-09-12 DIAGNOSIS — N3281 Overactive bladder: Secondary | ICD-10-CM | POA: Diagnosis not present

## 2020-09-12 DIAGNOSIS — E785 Hyperlipidemia, unspecified: Secondary | ICD-10-CM | POA: Diagnosis not present

## 2020-09-12 LAB — CBC WITH DIFFERENTIAL/PLATELET
Basophils Absolute: 0 10*3/uL (ref 0.0–0.1)
Basophils Relative: 0.6 % (ref 0.0–3.0)
Eosinophils Absolute: 0.1 10*3/uL (ref 0.0–0.7)
Eosinophils Relative: 1.9 % (ref 0.0–5.0)
HCT: 37.3 % (ref 36.0–46.0)
Hemoglobin: 12.4 g/dL (ref 12.0–15.0)
Lymphocytes Relative: 27 % (ref 12.0–46.0)
Lymphs Abs: 1.1 10*3/uL (ref 0.7–4.0)
MCHC: 33.2 g/dL (ref 30.0–36.0)
MCV: 93.1 fl (ref 78.0–100.0)
Monocytes Absolute: 0.3 10*3/uL (ref 0.1–1.0)
Monocytes Relative: 6.7 % (ref 3.0–12.0)
Neutro Abs: 2.7 10*3/uL (ref 1.4–7.7)
Neutrophils Relative %: 63.8 % (ref 43.0–77.0)
Platelets: 187 10*3/uL (ref 150.0–400.0)
RBC: 4.01 Mil/uL (ref 3.87–5.11)
RDW: 14 % (ref 11.5–15.5)
WBC: 4.2 10*3/uL (ref 4.0–10.5)

## 2020-09-12 LAB — COMPREHENSIVE METABOLIC PANEL
ALT: 16 U/L (ref 0–35)
AST: 22 U/L (ref 0–37)
Albumin: 4.6 g/dL (ref 3.5–5.2)
Alkaline Phosphatase: 23 U/L — ABNORMAL LOW (ref 39–117)
BUN: 22 mg/dL (ref 6–23)
CO2: 29 mEq/L (ref 19–32)
Calcium: 9.8 mg/dL (ref 8.4–10.5)
Chloride: 105 mEq/L (ref 96–112)
Creatinine, Ser: 1.1 mg/dL (ref 0.40–1.20)
GFR: 52.48 mL/min — ABNORMAL LOW (ref >60.00)
Glucose, Bld: 94 mg/dL (ref 70–99)
Potassium: 4.1 mEq/L (ref 3.5–5.1)
Sodium: 142 mEq/L (ref 135–145)
Total Bilirubin: 0.6 mg/dL (ref 0.2–1.2)
Total Protein: 6.9 g/dL (ref 6.0–8.3)

## 2020-09-12 LAB — HEMOGLOBIN A1C: Hgb A1c MFr Bld: 5.8 % (ref 4.6–6.5)

## 2020-09-12 LAB — LIPID PANEL
Cholesterol: 192 mg/dL (ref 0–200)
HDL: 89.5 mg/dL (ref >39.00)
LDL Cholesterol: 92 mg/dL (ref 0–99)
NonHDL: 102.64
Total CHOL/HDL Ratio: 2
Triglycerides: 55 mg/dL (ref 0.0–149.0)
VLDL: 11 mg/dL (ref 0.0–40.0)

## 2020-09-12 LAB — TSH: TSH: 1.39 u[IU]/mL (ref 0.35–4.50)

## 2020-09-12 MED ORDER — ATORVASTATIN CALCIUM 10 MG PO TABS: 10.0000 mg | ORAL_TABLET | Freq: Every day | ORAL | 3 refills | Status: DC

## 2020-09-12 MED ORDER — AMLODIPINE BESYLATE 2.5 MG PO TABS
2.5000 mg | ORAL_TABLET | Freq: Two times a day (BID) | ORAL | 3 refills | Status: DC
Start: 2020-09-12 — End: 2020-12-25

## 2020-09-12 MED ORDER — TELMISARTAN 20 MG PO TABS
20.0000 mg | ORAL_TABLET | Freq: Every day | ORAL | 3 refills | Status: DC
Start: 2020-09-12 — End: 2021-09-18

## 2020-09-12 MED ORDER — TROSPIUM CHLORIDE ER 60 MG PO CP24: 1.0000 | ORAL_CAPSULE | Freq: Every day | ORAL | 3 refills | Status: DC

## 2020-09-12 MED ORDER — SENNA-DOCUSATE SODIUM 8.6-50 MG PO TABS: 1.0000 | ORAL_TABLET | Freq: Every day | ORAL | 3 refills | Status: DC | PRN

## 2020-09-12 MED ORDER — DOCUSATE SODIUM 50 MG PO CAPS: 50.0000 mg | ORAL_CAPSULE | Freq: Every day | ORAL | 3 refills | Status: DC | PRN

## 2020-09-12 NOTE — Patient Instructions (Addendum)
Colonoscopy [facility] 04/18/21 due call me back middle of 02/2021 for the referral -should be back 3rd week 02/2021   Consider  Dr. Sherlene Shams GYN/urology  Call back for knee Xray right if wanted Tylenol, tumueric, voltaren gel 4x per day   Consider 4th covid 19 vaccine    Diabetes Mellitus and Nutrition, Adult When you have diabetes, or diabetes mellitus, it is very important to have healthy eating habits because your blood sugar (glucose) levels are greatly affected by what you eat and drink. Eating healthy foods in the right amounts, at about the same times every day, can help you:  Control your blood glucose.  Lower your risk of heart disease.  Improve your blood pressure.  Reach or maintain a healthy weight. What can affect my meal plan? Every person with diabetes is different, and each person has different needs for a meal plan. Your health care provider may recommend that you work with a dietitian to make a meal plan that is best for you. Your meal plan may vary depending on factors such as:  The calories you need.  The medicines you take.  Your weight.  Your blood glucose, blood pressure, and cholesterol levels.  Your activity level.  Other health conditions you have, such as heart or kidney disease. How do carbohydrates affect me? Carbohydrates, also called carbs, affect your blood glucose level more than any other type of food. Eating carbs naturally raises the amount of glucose in your blood. Carb counting is a method for keeping track of how many carbs you eat. Counting carbs is important to keep your blood glucose at a healthy level, especially if you use insulin or take certain oral diabetes medicines. It is important to know how many carbs you can safely have in each meal. This is different for every person. Your dietitian can help you calculate how many carbs you should have at each meal and for each snack. How does alcohol affect me? Alcohol can  cause a sudden decrease in blood glucose (hypoglycemia), especially if you use insulin or take certain oral diabetes medicines. Hypoglycemia can be a life-threatening condition. Symptoms of hypoglycemia, such as sleepiness, dizziness, and confusion, are similar to symptoms of having too much alcohol.  Do not drink alcohol if: ? Your health care provider tells you not to drink. ? You are pregnant, may be pregnant, or are planning to become pregnant.  If you drink alcohol: ? Do not drink on an empty stomach. ? Limit how much you use to:  0-1 drink a day for women.  0-2 drinks a day for men. ? Be aware of how much alcohol is in your drink. In the [place] one drink equals one 12 oz bottle of beer (355 mL), one 5 oz glass of wine (148 mL), or one 1 oz glass of hard liquor (44 mL). ? Keep yourself hydrated with water, diet soda, or unsweetened iced tea.  Keep in mind that regular soda, juice, and other mixers may contain a lot of sugar and must be counted as carbs. What are tips for following this plan? Reading food labels  Start by checking the serving size on the "Nutrition Facts" label of packaged foods and drinks. The amount of calories, carbs, fats, and other nutrients listed on the label is based on one serving of the item. Many items contain more than one serving per package.  Check the total grams (g) of carbs in one serving. You can calculate the number of servings  of carbs in one serving by dividing the total carbs by 15. For example, if a food has 30 g of total carbs per serving, it would be equal to 2 servings of carbs.  Check the number of grams (g) of saturated fats and trans fats in one serving. Choose foods that have a low amount or none of these fats.  Check the number of milligrams (mg) of salt (sodium) in one serving. Most people should limit total sodium intake to less than 2,300 mg per day.  Always check the nutrition information of foods labeled as "low-fat" or "nonfat."  These foods may be higher in added sugar or refined carbs and should be avoided.  Talk to your dietitian to identify your daily goals for nutrients listed on the label. Shopping  Avoid buying canned, pre-made, or processed foods. These foods tend to be high in fat, sodium, and added sugar.  Shop around the outside edge of the grocery store. This is where you will most often find fresh fruits and vegetables, bulk grains, fresh meats, and fresh dairy. Cooking  Use low-heat cooking methods, such as baking, instead of high-heat cooking methods like deep frying.  Cook using healthy oils, such as olive, canola, or sunflower oil.  Avoid cooking with butter, cream, or high-fat meats. Meal planning  Eat meals and snacks regularly, preferably at the same times every day. Avoid going long periods of time without eating.  Eat foods that are high in fiber, such as fresh fruits, vegetables, beans, and whole grains. Talk with your dietitian about how many servings of carbs you can eat at each meal.  Eat 4-6 oz (112-168 g) of lean protein each day, such as lean meat, chicken, fish, eggs, or tofu. One ounce (oz) of lean protein is equal to: ? 1 oz (28 g) of meat, chicken, or fish. ? 1 egg. ?  cup (62 g) of tofu.  Eat some foods each day that contain healthy fats, such as avocado, nuts, seeds, and fish.   What foods should I eat? Fruits Berries. Apples. Oranges. Peaches. Apricots. Plums. Grapes. Mango. Papaya. Pomegranate. Kiwi. Cherries. Vegetables Lettuce. Spinach. Leafy greens, including kale, chard, collard greens, and mustard greens. Beets. Cauliflower. Cabbage. Broccoli. Carrots. Green beans. Tomatoes. Peppers. Onions. Cucumbers. Brussels sprouts. Grains Whole grains, such as whole-wheat or whole-grain bread, crackers, tortillas, cereal, and pasta. Unsweetened oatmeal. Quinoa. Brown or wild rice. Meats and other proteins Seafood. Poultry without skin. Lean cuts of poultry and beef. Tofu.  Nuts. Seeds. Dairy Low-fat or fat-free dairy products such as milk, yogurt, and cheese. The items listed above may not be a complete list of foods and beverages you can eat. Contact a dietitian for more information. What foods should I avoid? Fruits Fruits canned with syrup. Vegetables Canned vegetables. Frozen vegetables with butter or cream sauce. Grains Refined white flour and flour products such as bread, pasta, snack foods, and cereals. Avoid all processed foods. Meats and other proteins Fatty cuts of meat. Poultry with skin. Breaded or fried meats. Processed meat. Avoid saturated fats. Dairy Full-fat yogurt, cheese, or milk. Beverages Sweetened drinks, such as soda or iced tea. The items listed above may not be a complete list of foods and beverages you should avoid. Contact a dietitian for more information. Questions to ask a health care provider  Do I need to meet with a diabetes educator?  Do I need to meet with a dietitian?  What number can I call if I have questions?  When are  the best times to check my blood glucose? Where to find more information:  American Diabetes Association: diabetes.org  Academy of Nutrition and Dietetics: www.eatright.[employer] of Diabetes and Digestive and Kidney Diseases: [employer]  Association of Diabetes Care and Education Specialists: www.diabeteseducator.org Summary  It is important to have healthy eating habits because your blood sugar (glucose) levels are greatly affected by what you eat and drink.  A healthy meal plan will help you control your blood glucose and maintain a healthy lifestyle.  Your health care provider may recommend that you work with a dietitian to make a meal plan that is best for you.  Keep in mind that carbohydrates (carbs) and alcohol have immediate effects on your blood glucose levels. It is important to count carbs and to use alcohol carefully. This information is not intended to  replace advice given to you by your health care provider. Make sure you discuss any questions you have with your health care provider. Document Revised: 03/29/2019 Document Reviewed: 03/29/2019 Elsevier Patient Education  2021 [place].  Prediabetes Eating Plan Prediabetes is a condition that causes blood sugar (glucose) levels to be higher than normal. This increases the risk for developing type 2 diabetes (type 2 diabetes mellitus). Working with a health care provider or nutrition specialist (dietitian) to make diet and lifestyle changes can help prevent the onset of diabetes. These changes may help you:  Control your blood glucose levels.  Improve your cholesterol levels.  Manage your blood pressure. What are tips for following this plan? Reading food labels  Read food labels to check the amount of fat, salt (sodium), and sugar in prepackaged foods. Avoid foods that have: ? Saturated fats. ? Trans fats. ? Added sugars.  Avoid foods that have more than 300 milligrams (mg) of sodium per serving. Limit your sodium intake to less than 2,300 mg each day. Shopping  Avoid buying pre-made and processed foods.  Avoid buying drinks with added sugar. Cooking  Cook with olive oil. Do not use butter, lard, or ghee.  Bake, broil, grill, steam, or boil foods. Avoid frying. Meal planning  Work with your dietitian to create an eating plan that is right for you. This may include tracking how many calories you take in each day. Use a food diary, notebook, or mobile application to track what you eat at each meal.  Consider following a Mediterranean diet. This includes: ? Eating several servings of fresh fruits and vegetables each day. ? Eating fish at least twice a week. ? Eating one serving each day of whole grains, beans, nuts, and seeds. ? Using olive oil instead of other fats. ? Limiting alcohol. ? Limiting red meat. ? Using nonfat or low-fat dairy products.  Consider following a  plant-based diet. This includes dietary choices that focus on eating mostly vegetables and fruit, grains, beans, nuts, and seeds.  If you have high blood pressure, you may need to limit your sodium intake or follow a diet such as the DASH (Dietary Approaches to Stop Hypertension) eating plan. The DASH diet aims to lower high blood pressure.   Lifestyle  Set weight loss goals with help from your health care team. It is recommended that most people with prediabetes lose 7% of their body weight.  Exercise for at least 30 minutes 5 or more days a week.  Attend a support group or seek support from a mental health counselor.  Take over-the-counter and prescription medicines only as told by your health care provider. What  foods are recommended? Fruits Berries. Bananas. Apples. Oranges. Grapes. Papaya. Mango. Pomegranate. Kiwi. Grapefruit. Cherries. Vegetables Lettuce. Spinach. Peas. Beets. Cauliflower. Cabbage. Broccoli. Carrots. Tomatoes. Squash. Eggplant. Herbs. Peppers. Onions. Cucumbers. Brussels sprouts. Grains Whole grains, such as whole-wheat or whole-grain breads, crackers, cereals, and pasta. Unsweetened oatmeal. Bulgur. Barley. Quinoa. Brown rice. Corn or whole-wheat flour tortillas or taco shells. Meats and other proteins Seafood. Poultry without skin. Lean cuts of pork and beef. Tofu. Eggs. Nuts. Beans. Dairy Low-fat or fat-free dairy products, such as yogurt, cottage cheese, and cheese. Beverages Water. Tea. Coffee. Sugar-free or diet soda. Seltzer water. Low-fat or nonfat milk. Milk alternatives, such as soy or almond milk. Fats and oils Olive oil. Canola oil. Sunflower oil. Grapeseed oil. Avocado. Walnuts. Sweets and desserts Sugar-free or low-fat pudding. Sugar-free or low-fat ice cream and other frozen treats. Seasonings and condiments Herbs. Sodium-free spices. Mustard. Relish. Low-salt, low-sugar ketchup. Low-salt, low-sugar barbecue sauce. Low-fat or fat-free  mayonnaise. The items listed above may not be a complete list of recommended foods and beverages. Contact a dietitian for more information. What foods are not recommended? Fruits Fruits canned with syrup. Vegetables Canned vegetables. Frozen vegetables with butter or cream sauce. Grains Refined white flour and flour products, such as bread, pasta, snack foods, and cereals. Meats and other proteins Fatty cuts of meat. Poultry with skin. Breaded or fried meat. Processed meats. Dairy Full-fat yogurt, cheese, or milk. Beverages Sweetened drinks, such as iced tea and soda. Fats and oils Butter. Lard. Ghee. Sweets and desserts Baked goods, such as cake, cupcakes, pastries, cookies, and cheesecake. Seasonings and condiments Spice mixes with added salt. Ketchup. Barbecue sauce. Mayonnaise. The items listed above may not be a complete list of foods and beverages that are not recommended. Contact a dietitian for more information. Where to find more information  American Diabetes Association: www.diabetes.org Summary  You may need to make diet and lifestyle changes to help prevent the onset of diabetes. These changes can help you control blood sugar, improve cholesterol levels, and manage blood pressure.  Set weight loss goals with help from your health care team. It is recommended that most people with prediabetes lose 7% of their body weight.  Consider following a Mediterranean diet. This includes eating plenty of fresh fruits and vegetables, whole grains, beans, nuts, seeds, fish, and low-fat dairy, and using olive oil instead of other fats. This information is not intended to replace advice given to you by your health care provider. Make sure you discuss any questions you have with your health care provider. Document Revised: 07/21/2019 Document Reviewed: 07/21/2019 Elsevier Patient Education  [place] or Strain Rehab Ask your health care provider which  exercises are safe for you. Do exercises exactly as told by your health care provider and adjust them as directed. It is normal to feel mild stretching, pulling, tightness, or discomfort as you do these exercises. Stop right away if you feel sudden pain or your pain gets worse. Do not begin these exercises until told by your health care provider. Stretching and range-of-motion exercises These exercises warm up your muscles and joints and improve the movement and flexibility of your back. These exercises also help to relieve pain, numbness, and tingling. Lumbar rotation 1. Lie on your back on a firm surface and bend your knees. 2. Straighten your arms out to your sides so each arm forms a 90-degree angle (right angle) with a side of your body. 3. Slowly move (rotate) both of  your knees to one side of your body until you feel a stretch in your lower back (lumbar). Try not to let your shoulders lift off the floor. 4. Hold this position for __________ seconds. 5. Tense your abdominal muscles and slowly move your knees back to the starting position. 6. Repeat this exercise on the other side of your body. Repeat __________ times. Complete this exercise __________ times a day.   Single knee to chest 1. Lie on your back on a firm surface with both legs straight. 2. Bend one of your knees. Use your hands to move your knee up toward your chest until you feel a gentle stretch in your lower back and buttock. ? Hold your leg in this position by holding on to the front of your knee. ? Keep your other leg as straight as possible. 3. Hold this position for __________ seconds. 4. Slowly return to the starting position. 5. Repeat with your other leg. Repeat __________ times. Complete this exercise __________ times a day.   Prone extension on elbows 1. Lie on your abdomen on a firm surface (prone position). 2. Prop yourself up on your elbows. 3. Use your arms to help lift your chest up until you feel a gentle  stretch in your abdomen and your lower back. ? This will place some of your body weight on your elbows. If this is uncomfortable, try stacking pillows under your chest. ? Your hips should stay down, against the surface that you are lying on. Keep your hip and back muscles relaxed. 4. Hold this position for __________ seconds. 5. Slowly relax your upper body and return to the starting position. Repeat __________ times. Complete this exercise __________ times a day.   Strengthening exercises These exercises build strength and endurance in your back. Endurance is the ability to use your muscles for a long time, even after they get tired. Pelvic tilt This exercise strengthens the muscles that lie deep in the abdomen. 1. Lie on your back on a firm surface. Bend your knees and keep your feet flat on the floor. 2. Tense your abdominal muscles. Tip your pelvis up toward the ceiling and flatten your lower back into the floor. ? To help with this exercise, you may place a small towel under your lower back and try to push your back into the towel. 3. Hold this position for __________ seconds. 4. Let your muscles relax completely before you repeat this exercise. Repeat __________ times. Complete this exercise __________ times a day. Alternating arm and leg raises 1. Get on your hands and knees on a firm surface. If you are on a hard floor, you may want to use padding, such as an exercise mat, to cushion your knees. 2. Line up your arms and legs. Your hands should be directly below your shoulders, and your knees should be directly below your hips. 3. Lift your left leg behind you. At the same time, raise your right arm and straighten it in front of you. ? Do not lift your leg higher than your hip. ? Do not lift your arm higher than your shoulder. ? Keep your abdominal and back muscles tight. ? Keep your hips facing the ground. ? Do not arch your back. ? Keep your balance carefully, and do not hold your  breath. 4. Hold this position for __________ seconds. 5. Slowly return to the starting position. 6. Repeat with your right leg and your left arm. Repeat __________ times. Complete this exercise __________ times a  day.   Abdominal set with straight leg raise 1. Lie on your back on a firm surface. 2. Bend one of your knees and keep your other leg straight. 3. Tense your abdominal muscles and lift your straight leg up, 4-6 inches (10-15 cm) off the ground. 4. Keep your abdominal muscles tight and hold this position for __________ seconds. ? Do not hold your breath. ? Do not arch your back. Keep it flat against the ground. 5. Keep your abdominal muscles tense as you slowly lower your leg back to the starting position. 6. Repeat with your other leg. Repeat __________ times. Complete this exercise __________ times a day.   Single leg lower with bent knees 1. Lie on your back on a firm surface. 2. Tense your abdominal muscles and lift your feet off the floor, one foot at a time, so your knees and hips are bent in 90-degree angles (right angles). ? Your knees should be over your hips and your lower legs should be parallel to the floor. 3. Keeping your abdominal muscles tense and your knee bent, slowly lower one of your legs so your toe touches the ground. 4. Lift your leg back up to return to the starting position. ? Do not hold your breath. ? Do not let your back arch. Keep your back flat against the ground. 5. Repeat with your other leg. Repeat __________ times. Complete this exercise __________ times a day. Posture and body mechanics Good posture and healthy body mechanics can help to relieve stress in your body's tissues and joints. Body mechanics refers to the movements and positions of your body while you do your daily activities. Posture is part of body mechanics. Good posture means:  Your spine is in its natural S-curve position (neutral).  Your shoulders are pulled back  slightly.  Your head is not tipped forward. Follow these guidelines to improve your posture and body mechanics in your everyday activities. Standing  When standing, keep your spine neutral and your feet about hip width apart. Keep a slight bend in your knees. Your ears, shoulders, and hips should line up.  When you do a task in which you stand in one place for a long time, place one foot up on a stable object that is 2-4 inches (5-10 cm) high, such as a footstool. This helps keep your spine neutral.   Sitting  When sitting, keep your spine neutral and keep your feet flat on the floor. Use a footrest, if necessary, and keep your thighs parallel to the floor. Avoid rounding your shoulders, and avoid tilting your head forward.  When working at a desk or a computer, keep your desk at a height where your hands are slightly lower than your elbows. Slide your chair under your desk so you are close enough to maintain good posture.  When working at a computer, place your monitor at a height where you are looking straight ahead and you do not have to tilt your head forward or downward to look at the screen.   Resting  When lying down and resting, avoid positions that are most painful for you.  If you have pain with activities such as sitting, bending, stooping, or squatting, lie in a position in which your body does not bend very much. For example, avoid curling up on your side with your arms and knees near your chest (fetal position).  If you have pain with activities such as standing for a long time or reaching with your arms,  lie with your spine in a neutral position and bend your knees slightly. Try the following positions: ? Lying on your side with a pillow between your knees. ? Lying on your back with a pillow under your knees. Lifting  When lifting objects, keep your feet at least shoulder width apart and tighten your abdominal muscles.  Bend your knees and hips and keep your spine neutral.  It is important to lift using the strength of your legs, not your back. Do not lock your knees straight out.  Always ask for help to lift heavy or awkward objects.   This information is not intended to replace advice given to you by your health care provider. Make sure you discuss any questions you have with your health care provider. Document Revised: 08/13/2018 Document Reviewed: 05/13/2018 Elsevier Patient Education  2021 [place].  Back Exercises The following exercises strengthen the muscles that help to support the trunk and back. They also help to keep the lower back flexible. Doing these exercises can help to prevent back pain or lessen existing pain.  If you have back pain or discomfort, try doing these exercises 2-3 times each day or as told by your health care provider.  As your pain improves, do them once each day, but increase the number of times that you repeat the steps for each exercise (do more repetitions).  To prevent the recurrence of back pain, continue to do these exercises once each day or as told by your health care provider. Do exercises exactly as told by your health care provider and adjust them as directed. It is normal to feel mild stretching, pulling, tightness, or discomfort as you do these exercises, but you should stop right away if you feel sudden pain or your pain gets worse. Exercises Single knee to chest Repeat these steps 3-5 times for each leg: 1. Lie on your back on a firm bed or the floor with your legs extended. 2. Bring one knee to your chest. Your other leg should stay extended and in contact with the floor. 3. Hold your knee in place by grabbing your knee or thigh with both hands and hold. 4. Pull on your knee until you feel a gentle stretch in your lower back or buttocks. 5. Hold the stretch for 10-30 seconds. 6. Slowly release and straighten your leg. Pelvic tilt Repeat these steps 5-10 times: 1. Lie on your back on a firm bed or the  floor with your legs extended. 2. Bend your knees so they are pointing toward the ceiling and your feet are flat on the floor. 3. Tighten your lower abdominal muscles to press your lower back against the floor. This motion will tilt your pelvis so your tailbone points up toward the ceiling instead of pointing to your feet or the floor. 4. With gentle tension and even breathing, hold this position for 5-10 seconds. Cat-cow Repeat these steps until your lower back becomes more flexible: 1. Get into a hands-and-knees position on a firm surface. Keep your hands under your shoulders, and keep your knees under your hips. You may place padding under your knees for comfort. 2. Let your head hang down toward your chest. Contract your abdominal muscles and point your tailbone toward the floor so your lower back becomes rounded like the back of a cat. 3. Hold this position for 5 seconds. 4. Slowly lift your head, let your abdominal muscles relax and point your tailbone up toward the ceiling so your back forms a  sagging arch like the back of a cow. 5. Hold this position for 5 seconds.   Press-ups Repeat these steps 5-10 times: 1. Lie on your abdomen (face-down) on the floor. 2. Place your palms near your head, about shoulder-width apart. 3. Keeping your back as relaxed as possible and keeping your hips on the floor, slowly straighten your arms to raise the top half of your body and lift your shoulders. Do not use your back muscles to raise your upper torso. You may adjust the placement of your hands to make yourself more comfortable. 4. Hold this position for 5 seconds while you keep your back relaxed. 5. Slowly return to lying flat on the floor.   Bridges Repeat these steps 10 times: 1. Lie on your back on a firm surface. 2. Bend your knees so they are pointing toward the ceiling and your feet are flat on the floor. Your arms should be flat at your sides, next to your body. 3. Tighten your buttocks  muscles and lift your buttocks off the floor until your waist is at almost the same height as your knees. You should feel the muscles working in your buttocks and the back of your thighs. If you do not feel these muscles, slide your feet 1-2 inches farther away from your buttocks. 4. Hold this position for 3-5 seconds. 5. Slowly lower your hips to the starting position, and allow your buttocks muscles to relax completely. If this exercise is too easy, try doing it with your arms crossed over your chest.   Abdominal crunches Repeat these steps 5-10 times: 1. Lie on your back on a firm bed or the floor with your legs extended. 2. Bend your knees so they are pointing toward the ceiling and your feet are flat on the floor. 3. Cross your arms over your chest. 4. Tip your chin slightly toward your chest without bending your neck. 5. Tighten your abdominal muscles and slowly raise your trunk (torso) high enough to lift your shoulder blades a tiny bit off the floor. Avoid raising your torso higher than that because it can put too much stress on your low back and does not help to strengthen your abdominal muscles. 6. Slowly return to your starting position. Back lifts Repeat these steps 5-10 times: 1. Lie on your abdomen (face-down) with your arms at your sides, and rest your forehead on the floor. 2. Tighten the muscles in your legs and your buttocks. 3. Slowly lift your chest off the floor while you keep your hips pressed to the floor. Keep the back of your head in line with the curve in your back. Your eyes should be looking at the floor. 4. Hold this position for 3-5 seconds. 5. Slowly return to your starting position. Contact a health care provider if:  Your back pain or discomfort gets much worse when you do an exercise.  Your worsening back pain or discomfort does not lessen within 2 hours after you exercise. If you have any of these problems, stop doing these exercises right away. Do not do  them again unless your health care provider says that you can. Get help right away if:  You develop sudden, severe back pain. If this happens, stop doing the exercises right away. Do not do them again unless your health care provider says that you can. This information is not intended to replace advice given to you by your health care provider. Make sure you discuss any questions you have with your health  care provider. Document Revised: 08/26/2018 Document Reviewed: 01/21/2018 Elsevier Patient Education  2021 [employer].  Knee Exercises Ask your health care provider which exercises are safe for you. Do exercises exactly as told by your health care provider and adjust them as directed. It is normal to feel mild stretching, pulling, tightness, or discomfort as you do these exercises. Stop right away if you feel sudden pain or your pain gets worse. Do not begin these exercises until told by your health care provider. Stretching and range-of-motion exercises These exercises warm up your muscles and joints and improve the movement and flexibility of your knee. These exercises also help to relieve pain and swelling. Knee extension, prone 1. Lie on your abdomen (prone position) on a bed. 2. Place your left / right knee just beyond the edge of the surface so your knee is not on the bed. You can put a towel under your left / right thigh just above your kneecap for comfort. 3. Relax your leg muscles and allow gravity to straighten your knee (extension). You should feel a stretch behind your left / right knee. 4. Hold this position for __________ seconds. 5. Scoot up so your knee is supported between repetitions. Repeat __________ times. Complete this exercise __________ times a day. Knee flexion, active 7. Lie on your back with both legs straight. If this causes back discomfort, bend your left / right knee so your foot is flat on the floor. 8. Slowly slide your left / right heel back toward your  buttocks. Stop when you feel a gentle stretch in the front of your knee or thigh (flexion). 9. Hold this position for __________ seconds. 10. Slowly slide your left / right heel back to the starting position. Repeat __________ times. Complete this exercise __________ times a day.   Quadriceps stretch, prone 5. Lie on your abdomen on a firm surface, such as a bed or padded floor. 6. Bend your left / right knee and hold your ankle. If you cannot reach your ankle or pant leg, loop a belt around your foot and grab the belt instead. 7. Gently pull your heel toward your buttocks. Your knee should not slide out to the side. You should feel a stretch in the front of your thigh and knee (quadriceps). 8. Hold this position for __________ seconds. Repeat __________ times. Complete this exercise __________ times a day.   Hamstring, supine 6. Lie on your back (supine position). 7. Loop a belt or towel over the ball of your left / right foot. The ball of your foot is on the walking surface, right under your toes. 8. Straighten your left / right knee and slowly pull on the belt to raise your leg until you feel a gentle stretch behind your knee (hamstring). ? Do not let your knee bend while you do this. ? Keep your other leg flat on the floor. 9. Hold this position for __________ seconds. Repeat __________ times. Complete this exercise __________ times a day. Strengthening exercises These exercises build strength and endurance in your knee. Endurance is the ability to use your muscles for a long time, even after they get tired. Quadriceps, isometric This exercise stretches the muscles in front of your thigh (quadriceps) without moving your knee joint (isometric). 6. Lie on your back with your left / right leg extended and your other knee bent. Put a rolled towel or small pillow under your knee if told by your health care provider. 7. Slowly tense the muscles in the  front of your left / right thigh. You should  see your kneecap slide up toward your hip or see increased dimpling just above the knee. This motion will push the back of the knee toward the floor. 8. For __________ seconds, hold the muscle as tight as you can without increasing your pain. 9. Relax the muscles slowly and completely. Repeat __________ times. Complete this exercise __________ times a day.   Straight leg raises This exercise stretches the muscles in front of your thigh (quadriceps) and the muscles that move your hips (hip flexors). 6. Lie on your back with your left / right leg extended and your other knee bent. 7. Tense the muscles in the front of your left / right thigh. You should see your kneecap slide up or see increased dimpling just above the knee. Your thigh may even shake a bit. 8. Keep these muscles tight as you raise your leg 4-6 inches (10-15 cm) off the floor. Do not let your knee bend. 9. Hold this position for __________ seconds. 10. Keep these muscles tense as you lower your leg. 11. Relax your muscles slowly and completely after each repetition. Repeat __________ times. Complete this exercise __________ times a day. Hamstring, isometric 7. Lie on your back on a firm surface. 8. Bend your left / right knee about __________ degrees. 9. Dig your left / right heel into the surface as if you are trying to pull it toward your buttocks. Tighten the muscles in the back of your thighs (hamstring) to "dig" as hard as you can without increasing any pain. 10. Hold this position for __________ seconds. 11. Release the tension gradually and allow your muscles to relax completely for __________ seconds after each repetition. Repeat __________ times. Complete this exercise __________ times a day. Hamstring curls If told by your health care provider, do this exercise while wearing ankle weights. Begin with __________ lb weights. Then increase the weight by 1 lb (0.5 kg) increments. Do not wear ankle weights that are more than  __________ lb. 6. Lie on your abdomen with your legs straight. 7. Bend your left / right knee as far as you can without feeling pain. Keep your hips flat against the floor. 8. Hold this position for __________ seconds. 9. Slowly lower your leg to the starting position. Repeat __________ times. Complete this exercise __________ times a day.   Squats This exercise strengthens the muscles in front of your thigh and knee (quadriceps). 1. Stand in front of a table, with your feet and knees pointing straight ahead. You may rest your hands on the table for balance but not for support. 2. Slowly bend your knees and lower your hips like you are going to sit in a chair. ? Keep your weight over your heels, not over your toes. ? Keep your lower legs upright so they are parallel with the table legs. ? Do not let your hips go lower than your knees. ? Do not bend lower than told by your health care provider. ? If your knee pain increases, do not bend as low. 3. Hold the squat position for __________ seconds. 4. Slowly push with your legs to return to standing. Do not use your hands to pull yourself to standing. Repeat __________ times. Complete this exercise __________ times a day. Wall slides This exercise strengthens the muscles in front of your thigh and knee (quadriceps). 1. Lean your back against a smooth wall or door, and walk your feet out 18-24 inches (46-61 cm)  from it. 2. Place your feet hip-width apart. 3. Slowly slide down the wall or door until your knees bend __________ degrees. Keep your knees over your heels, not over your toes. Keep your knees in line with your hips. 4. Hold this position for __________ seconds. Repeat __________ times. Complete this exercise __________ times a day.   Straight leg raises This exercise strengthens the muscles that rotate the leg at the hip and move it away from your body (hip abductors). 1. Lie on your side with your left / right leg in the top position.  Lie so your head, shoulder, knee, and hip line up. You may bend your bottom knee to help you keep your balance. 2. Roll your hips slightly forward so your hips are stacked directly over each other and your left / right knee is facing forward. 3. Leading with your heel, lift your top leg 4-6 inches (10-15 cm). You should feel the muscles in your outer hip lifting. ? Do not let your foot drift forward. ? Do not let your knee roll toward the ceiling. 4. Hold this position for __________ seconds. 5. Slowly return your leg to the starting position. 6. Let your muscles relax completely after each repetition. Repeat __________ times. Complete this exercise __________ times a day.   Straight leg raises This exercise stretches the muscles that move your hips away from the front of the pelvis (hip extensors). 1. Lie on your abdomen on a firm surface. You can put a pillow under your hips if that is more comfortable. 2. Tense the muscles in your buttocks and lift your left / right leg about 4-6 inches (10-15 cm). Keep your knee straight as you lift your leg. 3. Hold this position for __________ seconds. 4. Slowly lower your leg to the starting position. 5. Let your leg relax completely after each repetition. Repeat __________ times. Complete this exercise __________ times a day. This information is not intended to replace advice given to you by your health care provider. Make sure you discuss any questions you have with your health care provider. Document Revised: 02/09/2018 Document Reviewed: 02/09/2018 Elsevier Patient Education  2021 [employer].

## 2020-09-12 NOTE — Progress Notes (Signed)
Chief Complaint  Patient presents with  . Annual Exam   Annual 1. htn controlled on norvasc 2.5 mg qd up to 2 x per day if needed and micardis 20 mg qd  2. Doing well no complaints and no h/o covid 19    Review of Systems  Constitutional: Negative for weight loss.  HENT: Negative for hearing loss.   Eyes: Negative for blurred vision.  Respiratory: Negative for shortness of breath.   Cardiovascular: Negative for chest pain.  Gastrointestinal: Negative for abdominal pain.  Musculoskeletal: Negative for falls and joint pain.  Skin: Negative for rash.  Neurological: Negative for headaches.  Psychiatric/Behavioral: Negative for depression.   Past Medical History:  Diagnosis Date  . Actinic keratoses   . Allergy   . Chicken pox   . Colon polyps   . Dermatitis   . Heart murmur    youth  . Hyperlipidemia   . Hypertension   . Obesity   . Urgency incontinence    Past Surgical History:  Procedure Laterality Date  . BUNIONECTOMY Bilateral   . COLONOSCOPY    . COLONOSCOPY WITH PROPOFOL N/A 04/18/2016   Procedure: COLONOSCOPY WITH PROPOFOL;  Surgeon: Manya Silvas, MD;  Location: [facility] ENDOSCOPY;  Service: Endoscopy;  Laterality: N/A;  . TONSILLECTOMY AND ADENOIDECTOMY     Family History  Problem Relation Age of Onset  . Arthritis Mother   . Diabetes Mother   . Stroke Mother   . Arthritis Father   . Diabetes Father   . Early death Father   . Heart disease Father        MI 26 y.o   . Diabetes Sister   . Breast cancer Neg Hx    Social History   Socioeconomic History  . Marital status: Married    Spouse name: Not on file  . Number of children: Not on file  . Years of education: Not on file  . Highest education level: Not on file  Occupational History  . Not on file  Tobacco Use  . Smoking status: Never Smoker  . Smokeless tobacco: Never Used  Substance and Sexual Activity  . Alcohol use: Not on file    Comment: occasionally wine   . Drug use: No  . Sexual  activity: Never  Other Topics Concern  . Not on file  Social History Narrative   Married    1 daughter age 49 as of 03/2017    Retired   Social Determinants of Radio broadcast assistant Strain: Not on [employer] Insecurity: Not on file  Transportation Needs: Not on file  Physical Activity: Not on file  Stress: Not on file  Social Connections: Not on file  Intimate Partner Violence: Not on file   Current Meds  Medication Sig  . aspirin EC 81 MG tablet Take 81 mg by mouth daily.  . betamethasone, augmented, (DIPROLENE) 0.05 % gel Apply to aa's rash BID PRN  . Calcium-Magnesium-Vitamin D (CALCIUM 1200+D3 PO) Take by mouth 2 (two) times daily.  Stasia Cavalier (EUCRISA) 2 % OINT Apply 1 application topically in the morning and at bedtime.  . Multiple Vitamin (MULTI-VITAMINS) TABS Take by mouth.  . triamcinolone ointment (KENALOG) 0.1 % Apply 1 application topically 2 (two) times daily as needed.  . [DISCONTINUED] amLODipine (NORVASC) 2.5 MG tablet Take 1 tablet (2.5 mg total) by mouth daily. Can take additional dose if BP >130/>80  . [DISCONTINUED] atorvastatin (LIPITOR) 10 MG tablet Take 1 tablet (10 mg total)  by mouth daily at 6 PM.  . [DISCONTINUED] docusate sodium (COLACE) 50 MG capsule Take 50 mg by mouth daily.  . [DISCONTINUED] sennosides-docusate sodium (SENOKOT-S) 8.6-50 MG tablet Take 1 tablet by mouth daily.  . [DISCONTINUED] telmisartan (MICARDIS) 20 MG tablet Take 1 tablet (20 mg total) by mouth daily.  . [DISCONTINUED] Trospium Chloride 60 MG CP24 Take 1 capsule (60 mg total) by mouth daily. In the am d/c oxybutynin   Allergies  Allergen Reactions  . Terfenadine Rash   No results found for this or any previous visit (from the past 2160 hour(s)). Objective  Body mass index is 28.45 kg/m. Wt Readings from Last 3 Encounters:  09/12/20 171 lb 9.6 oz (77.8 kg)  02/03/20 171 lb 3.2 oz (77.7 kg)  10/13/19 168 lb 1.9 oz (76.3 kg)   Temp Readings from Last 3 Encounters:   09/12/20 (!) 97.4 F (36.3 C) (Oral)  02/03/20 98 F (36.7 C)  10/13/19 97.8 F (36.6 C) (Temporal)   BP Readings from Last 3 Encounters:  09/12/20 116/84  02/03/20 126/82  10/13/19 138/86   Pulse Readings from Last 3 Encounters:  09/12/20 78  02/03/20 74  10/13/19 61    Physical Exam Vitals and nursing note reviewed.  Constitutional:      Appearance: Normal appearance. She is well-developed, well-groomed and overweight.  HENT:     Head: Normocephalic and atraumatic.  Cardiovascular:     Rate and Rhythm: Normal rate and regular rhythm.     Heart sounds: Normal heart sounds. No murmur heard.   Pulmonary:     Effort: Pulmonary effort is normal.     Breath sounds: Normal breath sounds.  Abdominal:     Tenderness: There is no abdominal tenderness.  Skin:    General: Skin is warm and dry.  Neurological:     General: No focal deficit present.     Mental Status: She is alert and oriented to person, place, and time. Mental status is at baseline.     Gait: Gait normal.  Psychiatric:        Attention and Perception: Attention and perception normal.        Mood and Affect: Mood and affect normal.        Speech: Speech normal.        Behavior: Behavior normal. Behavior is cooperative.        Thought Content: Thought content normal.        Cognition and Memory: Cognition and memory normal.        Judgment: Judgment normal.     Assessment  Plan  Annual physical exam Flushotutd Tdap1/25/19 shingrix 2/2 covid 3/3 consider 4th dose prevnar utd  --> pna 23 due in future   Never smoker immuneMMR  Colonoscopy 04/18/16 IH, diverticulosis f/u in 5 years [place]   Paphad 04/03/17 f/u in 3 yearscheck w/ insurance if covered mammo 2/7/22normal, ordered  DEXA age 64ordered normal 06/11/20 normal F/u Dr. Melvern Sample saw 6/6/19and 2020 ln2 to face and chest Seen 08/10/19 f/u 1 year rec healthy diet and exercise  Call back if wants right knee Xray  Overactive  bladder - Plan: Trospium Chloride 60 MG CP24  Prediabetes - Plan: Hemoglobin A1c  Hypertension, controlled on norvasc 2.5 and micardisc 20 mg qd- Plan: Comprehensive metabolic panel, Lipid panel, CBC w/Diff  Acute cystitis without hematuria - Plan: Urinalysis, Routine w reflex microscopic, Urine Culture  Hyperlipidemia, unspecified hyperlipidemia type - Plan: atorvastatin (LIPITOR) 10 MG tablet     Provider: Dr.  Olivia Mackie McLean-Scocuzza-[facility]

## 2020-09-13 LAB — URINALYSIS, ROUTINE W REFLEX MICROSCOPIC
Bilirubin, UA: NEGATIVE
Glucose, UA: NEGATIVE
Ketones, UA: NEGATIVE
Leukocytes,UA: NEGATIVE
Nitrite, UA: NEGATIVE
Protein,UA: NEGATIVE
RBC, UA: NEGATIVE
Specific Gravity, UA: 1.014 (ref 1.005–1.030)
Urobilinogen, Ur: 0.2 mg/dL (ref 0.2–1.0)
pH, UA: 7 (ref 5.0–7.5)

## 2020-09-15 LAB — URINE CULTURE: Organism ID, Bacteria: NO GROWTH

## 2020-12-19 ENCOUNTER — Encounter: Payer: Self-pay | Admitting: Internal Medicine

## 2020-12-19 ENCOUNTER — Ambulatory Visit (INDEPENDENT_AMBULATORY_CARE_PROVIDER_SITE_OTHER): Payer: Medicare HMO

## 2020-12-19 VITALS — Ht 65.0 in | Wt 171.0 lb

## 2020-12-19 DIAGNOSIS — Z0000 Encounter for general adult medical examination without abnormal findings: Secondary | ICD-10-CM

## 2020-12-19 NOTE — Progress Notes (Addendum)
Subjective:   Melissa [name] is a 65 y.o. female who presents for Medicare Annual (Subsequent) preventive examination.  Review of Systems    No ROS.  Medicare Wellness Virtual Visit.  Visual/audio telehealth visit, UTA vital signs.   See social history for additional risk factors.   Cardiac Risk Factors include: advanced age (>9mn, >>57women)     Objective:    Today's Vitals   12/19/20 1323  Weight: 171 lb (77.6 kg)  Height: '5\' 5"'$  (1.651 m)   Body mass index is 28.46 kg/m.  Advanced Directives 12/19/2020 04/18/2016  Does Patient Have a Medical Advance Directive? No No  Would patient like information on creating a medical advance directive? No - Patient declined -    Current Medications (verified) Outpatient Encounter Medications as of 12/19/2020  Medication Sig   amLODipine (NORVASC) 2.5 MG tablet Take 1 tablet (2.5 mg total) by mouth in the morning and at bedtime. Can take additional dose if BP >130/>80   aspirin EC 81 MG tablet Take 81 mg by mouth daily.   atorvastatin (LIPITOR) 10 MG tablet Take 1 tablet (10 mg total) by mouth daily at 6 PM.   betamethasone, augmented, (DIPROLENE) 0.05 % gel Apply to aa's rash BID PRN   Calcium-Magnesium-Vitamin D (CALCIUM 1200+D3 PO) Take by mouth 2 (two) times daily.   Crisaborole (EUCRISA) 2 % OINT Apply 1 application topically in the morning and at bedtime.   docusate sodium (COLACE) 50 MG capsule Take 1 capsule (50 mg total) by mouth daily as needed for mild constipation.   Multiple Vitamin (MULTI-VITAMINS) TABS Take by mouth.   sennosides-docusate sodium (SENOKOT-S) 8.6-50 MG tablet Take 1 tablet by mouth daily as needed for constipation.   telmisartan (MICARDIS) 20 MG tablet Take 1 tablet (20 mg total) by mouth daily.   triamcinolone ointment (KENALOG) 0.1 % Apply 1 application topically 2 (two) times daily as needed.   Trospium Chloride 60 MG CP24 Take 1 capsule (60 mg total) by mouth daily. In the am d/c oxybutynin   No  facility-administered encounter medications on file as of 12/19/2020.    Allergies (verified) Terfenadine   History: Past Medical History:  Diagnosis Date   Actinic keratoses    Allergy    Chicken pox    Colon polyps    Dermatitis    Heart murmur    youth   Hyperlipidemia    Hypertension    Obesity    Urgency incontinence    Past Surgical History:  Procedure Laterality Date   BUNIONECTOMY Bilateral    COLONOSCOPY     COLONOSCOPY WITH PROPOFOL N/A 04/18/2016   Procedure: COLONOSCOPY WITH PROPOFOL;  Surgeon: RManya Silvas MD;  [facility]  Service: Endoscopy;  Laterality: N/A;   TONSILLECTOMY AND ADENOIDECTOMY     Family History  Problem Relation Age of Onset   Arthritis Mother    Diabetes Mother    Stroke Mother    Arthritis Father    Diabetes Father    Early death Father    Heart disease Father        MI 66y.o    Diabetes Sister    Breast cancer Neg Hx    Social History   Socioeconomic History   Marital status: Married    Spouse name: Not on file   Number of children: Not on file   Years of education: Not on file   Highest education level: Not on file  Occupational History   Not on  file  Tobacco Use   Smoking status: Never   Smokeless tobacco: Never  Substance and Sexual Activity   Alcohol use: Not on file    Comment: occasionally wine    Drug use: No   Sexual activity: Never  Other Topics Concern   Not on file  Social History Narrative   Married    1 daughter age 66 as of 03/2017    Retired   Social Determinants of Radio broadcast assistant Strain: Low Risk    Difficulty of Paying Living Expenses: Not hard at all  Food Insecurity: No Food Insecurity   Worried About Charity fundraiser in the Last Year: Never true   Arboriculturist in the Last Year: Never true  Transportation Needs: No Transportation Needs   Lack of Transportation (Medical): No   Lack of Transportation (Non-Medical): No  Physical Activity: Insufficiently  Active   Days of Exercise per Week: 4 days   Minutes of Exercise per Session: 20 min  Stress: No Stress Concern Present   Feeling of Stress : Not at all  Social Connections: Unknown   Frequency of Communication with Friends and Family: Never   Frequency of Social Gatherings with Friends and Family: Never   Attends Religious Services: Never   Printmaker: Not on file   Attends Archivist Meetings: Never   Marital Status: Not on file    Tobacco Counseling Counseling given: Not Answered   Clinical Intake:  Pre-visit preparation completed: Yes           How often do you need to have someone help you when you read instructions, pamphlets, or other written materials from your doctor or pharmacy?: 1 - Never   Interpreter Needed?: No      Activities of Daily Living In your present state of health, do you have any difficulty performing the following activities: 12/19/2020  Hearing? N  Vision? N  Difficulty concentrating or making decisions? N  Walking or climbing stairs? N  Dressing or bathing? N  Doing errands, shopping? N  Preparing Food and eating ? N  Using the Toilet? N  In the past six months, have you accidently leaked urine? N  Do you have problems with loss of bowel control? N  Managing your Medications? N  Managing your Finances? N  Housekeeping or managing your Housekeeping? N  Some recent data might be hidden    Patient Care Team: McLean-Scocuzza, Nino Glow, MD as PCP - General (Internal Medicine)  Indicate any recent Medical Services you may have received from other than [facility] providers in the past year (date may be approximate).     Assessment:   This is a routine wellness examination for Melissa [name].  I connected with Melissa [name] today by telephone and verified that I am speaking with the correct person using two identifiers. Location patient: home Location provider: work Persons participating in the virtual visit:  patient, Marine scientist.    I discussed the limitations, risks, security and privacy concerns of performing an evaluation and management service by telephone and the availability of in person appointments. The patient expressed understanding and verbally consented to this telephonic visit.    Interactive audio and video telecommunications were attempted between this provider and patient, however failed, due to patient having technical difficulties OR patient did not have access to video capability.  We continued and completed visit with audio only.  Some vital signs may be absent or patient reported.  Hearing/Vision screen Hearing Screening - Comments:: Patient is able to hear conversational tones without difficulty.  No issues reported. Vision Screening - Comments:: Wears corrective lenses Cataract extraction, bilateral Visual acuity not assessed, virtual visit.  They have seen their ophthalmologist in the last 12 months.    Dietary issues and exercise activities discussed: Current Exercise Habits: Home exercise routine, Intensity: Mild   Goals Addressed             This Visit's Progress    I would like to lose a little weight       Low carb diet       Depression Screen PHQ 2/9 Scores 12/19/2020 09/12/2020 02/03/2020 10/13/2019 03/17/2018 10/09/2017  PHQ - 2 Score 0 0 0 0 0 0  PHQ- 9 Score - 0 - - - -    Fall Risk Fall Risk  12/19/2020 09/12/2020 02/03/2020 10/13/2019 03/17/2018  Falls in the past year? 0 0 0 0 0  Number falls in past yr: - 0 0 0 -  Injury with Fall? 0 0 0 0 -  Follow up Falls evaluation completed Falls evaluation completed Falls evaluation completed Falls evaluation completed -    FALL RISK PREVENTION PERTAINING TO THE HOME: Adequate lighting in your home to reduce risk of falls? Yes   TIMED UP AND GO: Was the test performed? No .   Cognitive Function:        Immunizations Immunization History  Administered Date(s) Administered   Influenza, High Dose  Seasonal PF 02/10/2020   Influenza,inj,Quad PF,6+ Mos 02/12/2018, 02/16/2019   Influenza-Unspecified 03/19/2017, 02/16/2019   PFIZER(Purple Top)SARS-COV-2 Vaccination 05/11/2019, 06/01/2019, 02/10/2020   Pneumococcal Conjugate-13 10/13/2019   Tdap 05/29/2017   Zoster Recombinat (Shingrix) 03/17/2018, 07/23/2018   PNA vaccine- Due, Education has been provided regarding the importance of this vaccine. Advised may receive this vaccine at local pharmacy or Health Dept. Aware to provide a copy of the vaccination record if obtained from local pharmacy or Health Dept. Verbalized acceptance and understanding. Deferred.   Health Maintenance Health Maintenance  Topic Date Due   COVID-19 Vaccine (4 - Booster for Pfizer series) 01/04/2021 (Originally 05/12/2020)   INFLUENZA VACCINE  02/04/2021 (Originally 12/03/2020)   PNA vac Low Risk Adult (2 of 2 - PPSV23) 12/19/2021 (Originally 10/12/2020)   MAMMOGRAM  06/11/2022   COLONOSCOPY (Pts 45-86yr Insurance coverage will need to be confirmed)  04/18/2026   TETANUS/TDAP  05/30/2027   DEXA SCAN  Completed   Hepatitis C Screening  Completed   Zoster Vaccines- Shingrix  Completed   HPV VACCINES  Aged Out   Lung Cancer Screening: (Low Dose CT Chest recommended if Age 66-80years, 30 pack-year currently smoking OR have quit w/in 15years.) does not qualify.   Vision Screening: Recommended annual ophthalmology exams for early detection of glaucoma and other disorders of the eye.  Dental Screening: Recommended annual dental exams for proper oral hygiene  Community Resource Referral / Chronic Care Management: CRR required this visit?  No   CCM required this visit?  No      Plan:     I have personally reviewed and noted the following in the patient's chart:   Medical and social history Use of alcohol, tobacco or illicit drugs  Current medications and supplements including opioid prescriptions. Not taking opioid.  Functional ability and  status Nutritional status Physical activity Advanced directives List of other physicians Hospitalizations, surgeries, and ER visits in previous 12 months Vitals Screenings to include cognitive, depression, and falls Referrals and  appointments  In addition, I have reviewed and discussed with patient certain preventive protocols, quality metrics, and best practice recommendations. A written personalized care plan for preventive services as well as general preventive health recommendations were provided to patient.     OBrien-Blaney, Raden Byington L, LPN   [id number]    I have reviewed the above information and agree with above.   Deborra Medina, MD

## 2020-12-19 NOTE — Patient Instructions (Addendum)
Melissa [name] , Thank you for taking time to come for your Medicare Wellness Visit. I appreciate your ongoing commitment to your health goals. Please review the following plan we discussed and let me know if I can assist you in the future.   These are the goals we discussed:  Goals      I would like to lose a little weight     Low carb diet        This is a list of the screening recommended for you and due dates:  Health Maintenance  Topic Date Due   COVID-19 Vaccine (4 - Booster for Pfizer series) 01/04/2021*   Flu Shot  02/04/2021*   Pneumonia vaccines (2 of 2 - PPSV23) 12/19/2021*   Mammogram  06/11/2022   Colon Cancer Screening  04/18/2026   Tetanus Vaccine  05/30/2027   DEXA scan (bone density measurement)  Completed   Hepatitis C Screening: USPSTF Recommendation to screen - Ages 36-79 yo.  Completed   Zoster (Shingles) Vaccine  Completed   HPV Vaccine  Aged Out  *Topic was postponed. The date shown is not the original due date.    Advanced directives: not yet completed  Conditions/risks identified: none new  Follow up in one year for your annual wellness visit    Preventive Care 65 Years and Older, Female Preventive care refers to lifestyle choices and visits with your health care provider that can promote health and wellness. What does preventive care include? A yearly physical exam. This is also called an annual well check. Dental exams once or twice a year. Routine eye exams. Ask your health care provider how often you should have your eyes checked. Personal lifestyle choices, including: Daily care of your teeth and gums. Regular physical activity. Eating a healthy diet. Avoiding tobacco and drug use. Limiting alcohol use. Practicing safe sex. Taking low-dose aspirin every day. Taking vitamin and mineral supplements as recommended by your health care provider. What happens during an annual well check? The services and screenings done by your health care  provider during your annual well check will depend on your age, overall health, lifestyle risk factors, and family history of disease. Counseling  Your health care provider may ask you questions about your: Alcohol use. Tobacco use. Drug use. Emotional well-being. Home and relationship well-being. Sexual activity. Eating habits. History of falls. Memory and ability to understand (cognition). Work and work Statistician. Reproductive health. Screening  You may have the following tests or measurements: Height, weight, and BMI. Blood pressure. Lipid and cholesterol levels. These may be checked every 5 years, or more frequently if you are over 35 years old. Skin check. Lung cancer screening. You may have this screening every year starting at age 35 if you have a 30-pack-year history of smoking and currently smoke or have quit within the past 15 years. Fecal occult blood test (FOBT) of the stool. You may have this test every year starting at age 74. Flexible sigmoidoscopy or colonoscopy. You may have a sigmoidoscopy every 5 years or a colonoscopy every 10 years starting at age 36. Hepatitis C blood test. Hepatitis B blood test. Sexually transmitted disease (STD) testing. Diabetes screening. This is done by checking your blood sugar (glucose) after you have not eaten for a while (fasting). You may have this done every 1-3 years. Bone density scan. This is done to screen for osteoporosis. You may have this done starting at age 54. Mammogram. This may be done every 1-2 years. Talk to your  health care provider about how often you should have regular mammograms. Talk with your health care provider about your test results, treatment options, and if necessary, the need for more tests. Vaccines  Your health care provider may recommend certain vaccines, such as: Influenza vaccine. This is recommended every year. Tetanus, diphtheria, and acellular pertussis (Tdap, Td) vaccine. You may need a Td  booster every 10 years. Zoster vaccine. You may need this after age 32. Pneumococcal 13-valent conjugate (PCV13) vaccine. One dose is recommended after age 38. Pneumococcal polysaccharide (PPSV23) vaccine. One dose is recommended after age 79. Talk to your health care provider about which screenings and vaccines you need and how often you need them. This information is not intended to replace advice given to you by your health care provider. Make sure you discuss any questions you have with your health care provider. Document Released: 05/18/2015 Document Revised: 01/09/2016 Document Reviewed: 02/20/2015 Elsevier Interactive Patient Education  2017 [place] Prevention in the Home Falls can cause injuries. They can happen to people of all ages. There are many things you can do to make your home safe and to help prevent falls. What can I do on the outside of my home? Regularly fix the edges of walkways and driveways and fix any cracks. Remove anything that might make you trip as you walk through a door, such as a raised step or threshold. Trim any bushes or trees on the path to your home. Use bright outdoor lighting. Clear any walking paths of anything that might make someone trip, such as rocks or tools. Regularly check to see if handrails are loose or broken. Make sure that both sides of any steps have handrails. Any raised decks and porches should have guardrails on the edges. Have any leaves, snow, or ice cleared regularly. Use sand or salt on walking paths during winter. Clean up any spills in your garage right away. This includes oil or grease spills. What can I do in the bathroom? Use night lights. Install grab bars by the toilet and in the tub and shower. Do not use towel bars as grab bars. Use non-skid mats or decals in the tub or shower. If you need to sit down in the shower, use a plastic, non-slip stool. Keep the floor dry. Clean up any water that spills on the floor  as soon as it happens. Remove soap buildup in the tub or shower regularly. Attach bath mats securely with double-sided non-slip rug tape. Do not have throw rugs and other things on the floor that can make you trip. What can I do in the bedroom? Use night lights. Make sure that you have a light by your bed that is easy to reach. Do not use any sheets or blankets that are too big for your bed. They should not hang down onto the floor. Have a firm chair that has side arms. You can use this for support while you get dressed. Do not have throw rugs and other things on the floor that can make you trip. What can I do in the kitchen? Clean up any spills right away. Avoid walking on wet floors. Keep items that you use a lot in easy-to-reach places. If you need to reach something above you, use a strong step stool that has a grab bar. Keep electrical cords out of the way. Do not use floor polish or wax that makes floors slippery. If you must use wax, use non-skid floor wax. Do not have  throw rugs and other things on the floor that can make you trip. What can I do with my stairs? Do not leave any items on the stairs. Make sure that there are handrails on both sides of the stairs and use them. Fix handrails that are broken or loose. Make sure that handrails are as long as the stairways. Check any carpeting to make sure that it is firmly attached to the stairs. Fix any carpet that is loose or worn. Avoid having throw rugs at the top or bottom of the stairs. If you do have throw rugs, attach them to the floor with carpet tape. Make sure that you have a light switch at the top of the stairs and the bottom of the stairs. If you do not have them, ask someone to add them for you. What else can I do to help prevent falls? Wear shoes that: Do not have high heels. Have rubber bottoms. Are comfortable and fit you well. Are closed at the toe. Do not wear sandals. If you use a stepladder: Make sure that it is  fully opened. Do not climb a closed stepladder. Make sure that both sides of the stepladder are locked into place. Ask someone to hold it for you, if possible. Clearly mark and make sure that you can see: Any grab bars or handrails. First and last steps. Where the edge of each step is. Use tools that help you move around (mobility aids) if they are needed. These include: Canes. Walkers. Scooters. Crutches. Turn on the lights when you go into a dark area. Replace any light bulbs as soon as they burn out. Set up your furniture so you have a clear path. Avoid moving your furniture around. If any of your floors are uneven, fix them. If there are any pets around you, be aware of where they are. Review your medicines with your doctor. Some medicines can make you feel dizzy. This can increase your chance of falling. Ask your doctor what other things that you can do to help prevent falls. This information is not intended to replace advice given to you by your health care provider. Make sure you discuss any questions you have with your health care provider. Document Released: 02/15/2009 Document Revised: 09/27/2015 Document Reviewed: 05/26/2014 Elsevier Interactive Patient Education  2017 [employer].

## 2020-12-22 ENCOUNTER — Other Ambulatory Visit: Payer: Self-pay | Admitting: Internal Medicine

## 2020-12-22 DIAGNOSIS — I10 Essential (primary) hypertension: Secondary | ICD-10-CM

## 2021-04-09 ENCOUNTER — Encounter: Payer: Self-pay | Admitting: Physician Assistant

## 2021-04-09 ENCOUNTER — Ambulatory Visit: Payer: Self-pay | Admitting: Physician Assistant

## 2021-04-09 VITALS — BP 153/94 | HR 68 | Temp 98.1°F | Resp 16

## 2021-04-09 DIAGNOSIS — R059 Cough, unspecified: Secondary | ICD-10-CM

## 2021-04-09 LAB — POC COVID19 BINAXNOW: SARS Coronavirus 2 Ag: NEGATIVE

## 2021-04-09 MED ORDER — FEXOFENADINE-PSEUDOEPHED ER 60-120 MG PO TB12: 1.0000 | ORAL_TABLET | Freq: Two times a day (BID) | ORAL | 0 refills | Status: AC

## 2021-04-09 MED ORDER — HYDROCODONE BIT-HOMATROP MBR 5-1.5 MG/5ML PO SOLN: 5.0000 mL | Freq: Three times a day (TID) | ORAL | 0 refills | Status: DC | PRN

## 2021-04-09 MED ORDER — AZITHROMYCIN 250 MG PO TABS: ORAL_TABLET | ORAL | 0 refills | Status: AC

## 2021-04-09 NOTE — Progress Notes (Signed)
   Subjective: Cough    Patient ID: Melissa [name], female    DOB: 1955/08/19, 65 y.o.   MRN: [id number]  HPI Patient complain nonproductive cough for 2 weeks.  Patient also states sinus congestion and postnasal drainage.  Patient states upper chest congestion.  Patient states coughing spells interfere with sleeping".  Denies fever/chills.  No recent travel or contact with COVID-19.  Patient tested negative for rapid COVID-19 test today.   Review of Systems Hyperlipidemia and hypertension    Objective:   Physical Exam  No acute distress.  Nonproductive cough throughout exam.  Temperature 98.1, pulse 68, respiration 16, BP is 153/94, patient 90% O2 sat on room air. HEENT is remarkable bilateral maxillary guarding.  Postnasal drainage.  Lungs with bilateral rales.  Heart regular rate and rhythm.      Assessment & Plan: Upper respiratory infection.   Patient given prescription for Hycodan, Z-Pak, and Allegra-D.  Patient advised to follow-up telephonically in 2 days to assess improvement.  Advised address effects of medication.

## 2021-04-09 NOTE — Progress Notes (Signed)
Dry cough x2weeks - states can feel it moving, but nothing is coming out Facial pain pressure Stomach muscles hurt from coughing When coughing it make the front of head & back of head hurt. Neck muscles hurt from coughing No earache - sometimes get stopped up from blowing nose Nasal drainage - clear  DM Cough Medicine, Tylenol, Mucinex, Afrin nasal spray to open up the nose   Rapid Covid Test = Negative   AMD

## 2021-05-22 DIAGNOSIS — H2513 Age-related nuclear cataract, bilateral: Secondary | ICD-10-CM | POA: Diagnosis not present

## 2021-06-25 ENCOUNTER — Ambulatory Visit
Admission: RE | Admit: 2021-06-25 | Discharge: 2021-06-25 | Disposition: A | Discharge status: Home / Self Care | Payer: Medicare HMO | Source: Ambulatory Visit | Attending: Internal Medicine | Admitting: Internal Medicine

## 2021-06-25 ENCOUNTER — Other Ambulatory Visit: Payer: Self-pay

## 2021-06-25 DIAGNOSIS — Z1231 Encounter for screening mammogram for malignant neoplasm of breast: Secondary | ICD-10-CM | POA: Diagnosis not present

## 2021-07-04 DIAGNOSIS — E785 Hyperlipidemia, unspecified: Secondary | ICD-10-CM | POA: Diagnosis not present

## 2021-07-04 DIAGNOSIS — I10 Essential (primary) hypertension: Secondary | ICD-10-CM | POA: Diagnosis not present

## 2021-07-04 DIAGNOSIS — Z823 Family history of stroke: Secondary | ICD-10-CM | POA: Diagnosis not present

## 2021-07-04 DIAGNOSIS — Z888 Allergy status to other drugs, medicaments and biological substances status: Secondary | ICD-10-CM | POA: Diagnosis not present

## 2021-07-04 DIAGNOSIS — Z008 Encounter for other general examination: Secondary | ICD-10-CM | POA: Diagnosis not present

## 2021-07-04 DIAGNOSIS — Z833 Family history of diabetes mellitus: Secondary | ICD-10-CM | POA: Diagnosis not present

## 2021-07-04 DIAGNOSIS — K5900 Constipation, unspecified: Secondary | ICD-10-CM | POA: Diagnosis not present

## 2021-07-04 DIAGNOSIS — N3281 Overactive bladder: Secondary | ICD-10-CM | POA: Diagnosis not present

## 2021-07-04 DIAGNOSIS — Z8249 Family history of ischemic heart disease and other diseases of the circulatory system: Secondary | ICD-10-CM | POA: Diagnosis not present

## 2021-07-10 ENCOUNTER — Other Ambulatory Visit: Payer: Self-pay

## 2021-07-10 ENCOUNTER — Encounter: Payer: Self-pay | Admitting: Internal Medicine

## 2021-07-10 ENCOUNTER — Telehealth (INDEPENDENT_AMBULATORY_CARE_PROVIDER_SITE_OTHER): Payer: Medicare HMO | Admitting: Internal Medicine

## 2021-07-10 VITALS — BP 117/78 | Ht 65.0 in | Wt 169.0 lb

## 2021-07-10 DIAGNOSIS — R051 Acute cough: Secondary | ICD-10-CM | POA: Diagnosis not present

## 2021-07-10 DIAGNOSIS — J069 Acute upper respiratory infection, unspecified: Secondary | ICD-10-CM

## 2021-07-10 DIAGNOSIS — J0100 Acute maxillary sinusitis, unspecified: Secondary | ICD-10-CM | POA: Diagnosis not present

## 2021-07-10 MED ORDER — HYDROCODONE BIT-HOMATROP MBR 5-1.5 MG/5ML PO SOLN: 5.0000 mL | Freq: Every evening | ORAL | 0 refills | Status: DC | PRN

## 2021-07-10 MED ORDER — HYDROCOD POLI-CHLORPHE POLI ER 10-8 MG/5ML PO SUER
5.0000 mL | Freq: Every evening | ORAL | 0 refills | Status: DC | PRN
Start: 2021-07-10 — End: 2021-07-10

## 2021-07-10 MED ORDER — AMOXICILLIN-POT CLAVULANATE 875-125 MG PO TABS: 1.0000 | ORAL_TABLET | Freq: Two times a day (BID) | ORAL | 0 refills | Status: DC

## 2021-07-10 NOTE — Addendum Note (Signed)
Addended by: Orland Mustard on: 07/10/2021 04:16 PM ? ? Modules accepted: Orders ? ?

## 2021-07-10 NOTE — Progress Notes (Signed)
Virtual Visit via Video Note ? ?I connected with Melissa [name] ? on 07/10/21 at 10:40 AM EST by a video enabled telemedicine application and verified that I am speaking with the correct person using two identifiers. ? Location patient: [place] ?Location provider:work or home office ?Persons participating in the virtual visit: patient, provider ? ?I discussed the limitations and requested verbal permission for telemedicine visit. The patient expressed understanding and agreed to proceed. ? ? ?HPI: ? ?Acute telemedicine visit for : ?Sick visit cough, chills, fever to 100 last night, body aches, sore throat fatigue since Friday cough with phelgm no sick contacts having sinus pain on cheeks ?Tried otc cough meds w/o relief home covid test negative having congestion in nose and chest  ?Declines covid/strep/flu testing today ? ?Htn at times sbp 160 but took norvasc 2.5 x 2 and today 117/78 will message if elevates again  ?-Pertinent past medical history: see below ?-Pertinent medication allergies: ?Allergies  ?Allergen Reactions  ? Terfenadine Rash  ? ?-COVID-19 vaccine status:  ?Immunization History  ?Administered Date(s) Administered  ? Influenza, High Dose Seasonal PF 02/10/2020, 03/13/2021  ? Influenza,inj,Quad PF,6+ Mos 02/12/2018, 02/16/2019  ? Influenza-Unspecified 03/19/2017, 02/16/2019  ? PFIZER(Purple Top)SARS-COV-2 Vaccination 05/11/2019, 06/01/2019, 02/10/2020  ? Pension scheme manager 65yr & up 02/26/2021  ? Pneumococcal Conjugate-13 10/13/2019  ? Tdap 05/29/2017  ? Zoster Recombinat (Shingrix) 03/17/2018, 07/23/2018  ? ? ? ?ROS: See pertinent positives and negatives per HPI. ? ?Past Medical History:  ?Diagnosis Date  ? Actinic keratoses   ? Allergy   ? Chicken pox   ? Colon polyps   ? Dermatitis   ? Heart murmur   ? youth  ? Hyperlipidemia   ? Hypertension   ? Obesity   ? Urgency incontinence   ? ? ?Past Surgical History:  ?Procedure Laterality Date  ? BUNIONECTOMY Bilateral   ? COLONOSCOPY    ?  COLONOSCOPY WITH PROPOFOL N/A 04/18/2016  ? Procedure: COLONOSCOPY WITH PROPOFOL;  Surgeon: RManya Silvas MD;  [facility];  Service: Endoscopy;  Laterality: N/A;  ? TONSILLECTOMY AND ADENOIDECTOMY    ? ? ? ?Current Outpatient Medications:  ?  amLODipine (NORVASC) 2.5 MG tablet, TAKE 1 TABLET (2.5 MG TOTAL) BY MOUTH DAILY. CAN TAKE ADDITIONAL DOSE IF BP >130/>80, Disp: 180 tablet, Rfl: 3 ?  amoxicillin-clavulanate (AUGMENTIN) 875-125 MG tablet, Take 1 tablet by mouth 2 (two) times daily. With food, Disp: 14 tablet, Rfl: 0 ?  aspirin EC 81 MG tablet, Take 81 mg by mouth daily., Disp: , Rfl:  ?  atorvastatin (LIPITOR) 10 MG tablet, Take 1 tablet (10 mg total) by mouth daily at 6 PM., Disp: 90 tablet, Rfl: 3 ?  betamethasone, augmented, (DIPROLENE) 0.05 % gel, Apply to aa's rash BID PRN, Disp: 30 g, Rfl: 0 ?  Calcium-Magnesium-Vitamin D (CALCIUM 1200+D3 PO), Take by mouth 2 (two) times daily., Disp: , Rfl:  ?  chlorpheniramine-HYDROcodone (TUSSIONEX PENNKINETIC ER) 10-8 MG/5ML, Take 5 mLs by mouth at bedtime as needed., Disp: 115 mL, Rfl: 0 ?  Crisaborole (EUCRISA) 2 % OINT, Apply 1 application topically in the morning and at bedtime., Disp: 60 g, Rfl: 3 ?  docusate sodium (COLACE) 50 MG capsule, Take 1 capsule (50 mg total) by mouth daily as needed for mild constipation., Disp: 90 capsule, Rfl: 3 ?  fexofenadine-pseudoephedrine (ALLEGRA-D) 60-120 MG 12 hr tablet, Take 1 tablet by mouth 2 (two) times daily., Disp: 20 tablet, Rfl: 0 ?  HYDROcodone bit-homatropine (HYCODAN) 5-1.5 MG/5ML syrup,  Take 5 mLs by mouth every 8 (eight) hours as needed for cough., Disp: 120 mL, Rfl: 0 ?  Multiple Vitamin (MULTI-VITAMINS) TABS, Take by mouth., Disp: , Rfl:  ?  sennosides-docusate sodium (SENOKOT-S) 8.6-50 MG tablet, Take 1 tablet by mouth daily as needed for constipation., Disp: 90 tablet, Rfl: 3 ?  telmisartan (MICARDIS) 20 MG tablet, Take 1 tablet (20 mg total) by mouth daily., Disp: 90 tablet, Rfl: 3 ?   triamcinolone ointment (KENALOG) 0.1 %, Apply 1 application topically 2 (two) times daily as needed., Disp: , Rfl:  ?  Trospium Chloride 60 MG CP24, Take 1 capsule (60 mg total) by mouth daily. In the am d/c oxybutynin, Disp: 90 capsule, Rfl: 3 ? ?EXAM: ? ?VITALS per patient if applicable: ? ?GENERAL: alert, oriented, appears well and in no acute distress ? ?HEENT: atraumatic, conjunttiva clear, no obvious abnormalities on inspection of external nose and ears ? ?NECK: normal movements of the head and neck ? ?LUNGS: on inspection no signs of respiratory distress, breathing rate appears normal, no obvious gross SOB, gasping or wheezing ?+cough on exam  ? ?CV: no obvious cyanosis ? ?MS: moves all visible extremities without noticeable abnormality ? ?PSYCH/NEURO: pleasant and cooperative, no obvious depression or anxiety, speech and thought processing grossly intact ? ?ASSESSMENT AND PLAN: ? ?Discussed the following assessment and plan: ? ?Acute non-recurrent maxillary sinusitis - Plan: amoxicillin-clavulanate (AUGMENTIN) 875-125 MG tablet, chlorpheniramine-HYDROcodone (TUSSIONEX PENNKINETIC ER) 10-8 MG/5ML ? ?Upper respiratory tract infection, unspecified type - Plan: chlorpheniramine-HYDROcodone (TUSSIONEX PENNKINETIC ER) 10-8 MG/5ML ? ?Acute cough - Plan: chlorpheniramine-HYDROcodone (TUSSIONEX PENNKINETIC ER) 10-8 MG/5ML ? ?HM ?Flu shot utd ?Tdap 05/29/17  ?shingrix 2/2  ?covid 3/3 consider 4th dose ?prevnar utd  ?--> pna 23 due in future  ?  ?Never smoker  ?immune MMR ?  ?Colonoscopy 04/18/16 [facility], diverticulosis f/u in 5 years [place]  ?  ?Pap had 04/03/17 f/u in 3 years check w/ insurance if covered  ?mammo 06/11/20 normal, ordered  ?DEXA age 49 ordered normal 06/11/20 normal ?F/u Dr. Melvern Sample saw 10/08/17 and 2020 ln2 to face and chest  ?Seen 08/10/19 f/u 1 year ?rec healthy diet and exercise  ?Call back if wants right knee Xray ?  ? ?  ?-we discussed possible serious and likely etiologies, options for evaluation and workup,  limitations of telemedicine visit vs in person visit, treatment, treatment risks and precautions. Pt is agreeable to treatment via telemedicine at this moment.  ? ?I discussed the assessment and treatment plan with the patient. The patient was provided an opportunity to ask questions and all were answered. The patient agreed with the plan and demonstrated an understanding of the instructions. ?  ? ?Time spent 20 minutes  ?Nino Glow McLean-Scocuzza, MD   ?

## 2021-07-16 DIAGNOSIS — H43813 Vitreous degeneration, bilateral: Secondary | ICD-10-CM | POA: Diagnosis not present

## 2021-07-25 DIAGNOSIS — Z86010 Personal history of colonic polyps: Secondary | ICD-10-CM | POA: Diagnosis not present

## 2021-07-31 NOTE — Progress Notes (Signed)
Letter sent to call and schedule ?

## 2021-08-15 ENCOUNTER — Ambulatory Visit: Payer: Medicare HMO | Admitting: Dermatology

## 2021-08-15 DIAGNOSIS — L821 Other seborrheic keratosis: Secondary | ICD-10-CM | POA: Diagnosis not present

## 2021-08-15 DIAGNOSIS — L814 Other melanin hyperpigmentation: Secondary | ICD-10-CM | POA: Diagnosis not present

## 2021-08-15 DIAGNOSIS — L570 Actinic keratosis: Secondary | ICD-10-CM | POA: Diagnosis not present

## 2021-08-15 DIAGNOSIS — D1800 Hemangioma unspecified site: Secondary | ICD-10-CM

## 2021-08-15 DIAGNOSIS — L309 Dermatitis, unspecified: Secondary | ICD-10-CM

## 2021-08-15 DIAGNOSIS — L237 Allergic contact dermatitis due to plants, except food: Secondary | ICD-10-CM

## 2021-08-15 DIAGNOSIS — L820 Inflamed seborrheic keratosis: Secondary | ICD-10-CM | POA: Diagnosis not present

## 2021-08-15 DIAGNOSIS — D229 Melanocytic nevi, unspecified: Secondary | ICD-10-CM | POA: Diagnosis not present

## 2021-08-15 DIAGNOSIS — R202 Paresthesia of skin: Secondary | ICD-10-CM

## 2021-08-15 DIAGNOSIS — L578 Other skin changes due to chronic exposure to nonionizing radiation: Secondary | ICD-10-CM

## 2021-08-15 DIAGNOSIS — Z1283 Encounter for screening for malignant neoplasm of skin: Secondary | ICD-10-CM | POA: Diagnosis not present

## 2021-08-15 MED ORDER — EUCRISA 2 % EX OINT: 1.0000 "application " | TOPICAL_OINTMENT | Freq: Two times a day (BID) | CUTANEOUS | 4 refills | Status: DC

## 2021-08-15 MED ORDER — BETAMETHASONE DIPROPIONATE AUG 0.05 % EX GEL: CUTANEOUS | 0 refills | Status: DC

## 2021-08-15 NOTE — Patient Instructions (Addendum)
Can use dipolene gel at affected thumb for a week then switch to [place].  ? ? ? ? ? ?Notalgia paresthetica ?Perispinal hyperpigmented patch ?- Chronic condition without cure ?- Secondary to pinched nerve along spine causing itching or sensation changes in an area of skin. Chronic rubbing or scratching causes darkening of the skin.  ?- OTC treatments which can help with itch include numbing creams like pramoxine or lidocaine which temporarily reduce itch or Capsaicin-containing creams which cause a burning sensation but which sometimes over time will reset the nerves to stop producing itch.  ?- If you choose to use Capsaicin cream, it is recommended to use it 5 times daily for 1 week followed by 3 times daily for 3-6 weeks. You may have to continue using it long-term.  ?- For severe cases, there are some prescription cream or pill options which may help. ? ? ? ? ? ?Actinic keratoses are precancerous spots that appear secondary to cumulative UV radiation exposure/sun exposure over time. They are chronic with expected duration over 1 year. A portion of actinic keratoses will progress to squamous cell carcinoma of the skin. It is not possible to reliably predict which spots will progress to skin cancer and so treatment is recommended to prevent development of skin cancer. ? ?Recommend daily broad spectrum sunscreen SPF 30+ to sun-exposed areas, reapply every 2 hours as needed.  ?Recommend staying in the shade or wearing long sleeves, sun glasses (UVA+UVB protection) and wide brim hats (4-inch brim around the entire circumference of the hat). ?Call for new or changing lesions.  ? ?Cryotherapy Aftercare ? ?Wash gently with soap and water everyday.   ?Apply Vaseline and Band-Aid daily until healed.  ? ? ? ? ? ?Melanoma ABCDEs ? ? ? ?Melanoma is the most dangerous type of skin cancer, and is the leading cause of death from skin disease.  You are more likely to develop melanoma if you: ?Have light-colored skin,  light-colored eyes, or red or blond hair ?Spend a lot of time in the sun ?Tan regularly, either outdoors or in a tanning bed ?Have had blistering sunburns, especially during childhood ?Have a close family member who has had a melanoma ?Have atypical moles or large birthmarks ? ?Early detection of melanoma is key since treatment is typically straightforward and cure rates are extremely high if we catch it early.  ? ?The first sign of melanoma is often a change in a mole or a new dark spot.  The ABCDE system is a way of remembering the signs of melanoma. ? ?A for asymmetry:  The two halves do not match. ?B for border:  The edges of the growth are irregular. ?C for color:  A mixture of colors are present instead of an even brown color. ?D for diameter:  Melanomas are usually (but not always) greater than 57m - the size of a pencil eraser. ?E for evolution:  The spot keeps changing in size, shape, and color. ? ?Please check your skin once per month between visits. You can use a small mirror in front and a large mirror behind you to keep an eye on the back side or your body.  ? ?If you see any new or changing lesions before your next follow-up, please call to schedule a visit. ? ?Please continue daily skin protection including broad spectrum sunscreen SPF 30+ to sun-exposed areas, reapplying every 2 hours as needed when you're outdoors.  ? ?Staying in the shade or wearing long sleeves, sun glasses (UVA+UVB protection)  and wide brim hats (4-inch brim around the entire circumference of the hat) are also recommended for sun protection.   ? ?If You Need Anything After Your Visit ? ?If you have any questions or concerns for your doctor, please call our main line at [phone] and press option 4 to reach your doctor's medical assistant. If no one answers, please leave a voicemail as directed and we will return your call as soon as possible. Messages left after 4 pm will be answered the following business day.  ? ?You may  also send [place] a message via MyChart. We typically respond to MyChart messages within 1-2 business days. ? ?For prescription refills, please ask your pharmacy to contact our office. Our fax number is [phone]. ? ?If you have an urgent issue when the clinic is closed that cannot wait until the next business day, you can page your doctor at the number below.   ? ?Please note that while we do our best to be available for urgent issues outside of office hours, we are not available 24/7.  ? ?If you have an urgent issue and are unable to reach [place], you may choose to seek medical care at your doctor's office, retail clinic, urgent care center, or emergency room. ? ?If you have a medical emergency, please immediately call 911 or go to the emergency department. ? ?Pager Numbers ? ?- Dr. Nehemiah Massed: [phone] ? ?- Dr. Laurence Ferrari: [phone] ? ?- Dr. Nicole Kindred: [phone] ? ?In the event of inclement weather, please call our main line at [phone] for an update on the status of any delays or closures. ? ?Dermatology Medication Tips: ?Please keep the boxes that topical medications come in in order to help keep track of the instructions about where and how to use these. Pharmacies typically print the medication instructions only on the boxes and not directly on the medication tubes.  ? ?If your medication is too expensive, please contact our office at [phone] option 4 or send [place] a message through [place].  ? ?We are unable to tell what your co-pay for medications will be in advance as this is different depending on your insurance coverage. However, we may be able to find a substitute medication at lower cost or fill out paperwork to get insurance to cover a needed medication.  ? ?If a prior authorization is required to get your medication covered by your insurance company, please allow [place] 1-2 business days to complete this process. ? ?Drug prices often vary depending on where the prescription is filled and some pharmacies  may offer cheaper prices. ? ?The website www.goodrx.com contains coupons for medications through different pharmacies. The prices here do not account for what the cost may be with help from insurance (it may be cheaper with your insurance), but the website can give you the price if you did not use any insurance.  ?- You can print the associated coupon and take it with your prescription to the pharmacy.  ?- You may also stop by our office during regular business hours and pick up a GoodRx coupon card.  ?- If you need your prescription sent electronically to a different pharmacy, notify our office through [facility] or by phone at [phone] option 4. ? ? ? ? ?Si Usted Necesita Algo Despu?s de Su Visita ? ?Tambi?n puede enviarnos un mensaje a trav?s de MyChart. Por lo general respondemos a los mensajes de MyChart en el transcurso de 1 a 2 d?as h?biles. ? ?Para renovar recetas, por  favor pida a su farmacia que se ponga en contacto con nuestra oficina. Nuestro n?mero de fax es el [phone]. ? ?Si tiene un asunto urgente cuando la cl?nica est? cerrada y que no puede esperar hasta el siguiente d?a h?bil, puede llamar/localizar a su doctor(a) al n?mero que aparece a continuaci?n.  ? ?Por favor, tenga en cuenta que aunque hacemos todo lo posible para estar disponibles para asuntos urgentes fuera del horario de oficina, no estamos disponibles las 24 horas del d?a, los 7 d?as de la semana.  ? ?Si tiene un problema urgente y no puede comunicarse con nosotros, puede optar por buscar atenci?n m?dica  en el consultorio de su doctor(a), en una cl?nica privada, en un centro de atenci?n urgente o en una sala de emergencias. ? ?Si tiene Engineer, maintenance (IT) m?dica, por favor llame inmediatamente al 911 o vaya a la sala de emergencias. ? ?N?meros de b?per ? ?- Dr. Nehemiah Massed: [phone] ? ?- Dra. Moye: [phone] ? ?- Dra. Nicole Kindred: [phone] ? ?En caso de inclemencias del tiempo, por favor llame a nuestra l?nea principal  al [phone] para una actualizaci?n sobre el estado de cualquier retraso o cierre. ? ?Consejos para la medicaci?n en dermatolog?a: ?Por favor, guarde las cajas en las que vienen los medicamentos de uso t?pico pa

## 2021-08-15 NOTE — Progress Notes (Signed)
? ?Follow-Up Visit ?  ?Subjective  ?Melissa [name] is a 66 y.o. female who presents for the following: Annual Exam (1 year tbse, hx of aks at forehead, h/o hand derm tmc and eucrisa as needed. ). ?The patient presents for Total-Body Skin Exam (TBSE) for skin cancer screening and mole check.  The patient has spots, moles and lesions to be evaluated, some may be new or changing and the patient has concerns that these could be cancer. ? ?The following portions of the chart were reviewed this encounter and updated as appropriate:  Tobacco  Allergies  Meds  Problems  Med Hx  Surg Hx  Fam Hx   ?  ?Review of Systems: No other skin or systemic complaints except as noted in HPI or Assessment and Plan. ? ?Objective  ?Well appearing patient in no apparent distress; mood and affect are within normal limits. ? ?A full examination was performed including scalp, head, eyes, ears, nose, lips, neck, chest, axillae, abdomen, back, buttocks, bilateral upper extremities, bilateral lower extremities, hands, feet, fingers, toes, fingernails, and toenails. All findings within normal limits unless otherwise noted below. ? ?b/l hands ?With fissure on thumb ? ?face x 4 (4) ?Erythematous thin papules/macules with gritty scale.  ? ?left neck infraauricular x 1, chest x 1, right forehead x 2, right forearm x 1 (5) ?Erythematous stuck-on, waxy papule or plaque ? ? ?Assessment & Plan  ?Atopic dermatitis  / hand dermatitis ?b/l hands ?Chronic and persistent condition with duration or expected duration over one year. Condition is symptomatic / bothersome to patient. Not to goal. ?Hand Dermatitis is a chronic type of eczema that can come and go on the hands and fingers.  While there is no cure, the rash and symptoms can be managed with topical prescription medications, and for more severe cases, with systemic medications.  Recommend mild soap and routine use of moisturizing cream after handwashing.  Minimize soap/water exposure when  possible.   ?Atopic dermatitis (eczema) is a chronic, relapsing, pruritic condition that can significantly affect quality of life. It is often associated with allergic rhinitis and/or asthma and can require treatment with topical medications, phototherapy, or in severe cases biologic injectable medication (Dupixent; Adbry) or Oral JAK inhibitors. ?B/L hand ?Advise of chronic nature.  She is improving but still persistent issues with slight flare. ? ?Can use dipolene for a week then discontinue and switch to eucrissa  ?  ?D/C TMC 0.1% ointment and only use with flares.  ?Start Eucrisa 2% ointment to aa's BID.  ?If flares occur restart TMC until calmed then D/C again.  ?Recommend mild soaps and moisturizers.  ?Use gloves when doing cleaning work ?Related Medications ?Crisaborole (EUCRISA) 2 % OINT ?Apply 1 application. topically in the morning and at bedtime. ? ?Actinic keratosis (4) ?face x 4 ?Actinic keratoses are precancerous spots that appear secondary to cumulative UV radiation exposure/sun exposure over time. They are chronic with expected duration over 1 year. A portion of actinic keratoses will progress to squamous cell carcinoma of the skin. It is not possible to reliably predict which spots will progress to skin cancer and so treatment is recommended to prevent development of skin cancer. ? ?Recommend daily broad spectrum sunscreen SPF 30+ to sun-exposed areas, reapply every 2 hours as needed.  ?Recommend staying in the shade or wearing long sleeves, sun glasses (UVA+UVB protection) and wide brim hats (4-inch brim around the entire circumference of the hat). ?Call for new or changing lesions. ? ?Destruction of lesion - face  x 4 ?Complexity: simple   ?Destruction method: cryotherapy   ?Informed consent: discussed and consent obtained   ?Timeout:  patient name, date of birth, surgical site, and procedure verified ?Lesion destroyed using liquid nitrogen: Yes   ?Region frozen until ice ball extended beyond  lesion: Yes   ?Outcome: patient tolerated procedure well with no complications   ?Post-procedure details: wound care instructions given   ?Additional details:  Prior to procedure, discussed risks of blister formation, small wound, skin dyspigmentation, or rare scar following cryotherapy. Recommend Vaseline ointment to treated areas while healing. ? ?Inflamed seborrheic keratosis (5) ?left neck infraauricular x 1, chest x 1, right forehead x 2, right forearm x 1 ?Irritated and bothers patient ? ?Destruction of lesion - left neck infraauricular x 1, chest x 1, right forehead x 2, right forearm x 1 ?Complexity: simple   ?Destruction method: cryotherapy   ?Informed consent: discussed and consent obtained   ?Timeout:  patient name, date of birth, surgical site, and procedure verified ?Lesion destroyed using liquid nitrogen: Yes   ?Region frozen until ice ball extended beyond lesion: Yes   ?Outcome: patient tolerated procedure well with no complications   ?Post-procedure details: wound care instructions given   ?Additional details:  Prior to procedure, discussed risks of blister formation, small wound, skin dyspigmentation, or rare scar following cryotherapy. Recommend Vaseline ointment to treated areas while healing. ? ?Allergic contact dermatitis due to plants, except food ?Right Lower Leg - Anterior ?Likely poison ivy.  Use Diprolene twice daily. ?Related Medications ?betamethasone, augmented, (DIPROLENE) 0.05 % gel ?Apply to aa's rash BID PRN only use when needed ? ?Skin cancer screening ? ?Lentigines ?- Scattered tan macules ?- Due to sun exposure ?- Benign-appearing, observe ?- Recommend daily broad spectrum sunscreen SPF 30+ to sun-exposed areas, reapply every 2 hours as needed. ?- Call for any changes ? ?Seborrheic Keratoses ?- Stuck-on, waxy, tan-brown papules and/or plaques  ?- Benign-appearing ?- Discussed benign etiology and prognosis. ?- Observe ?- Call for any changes ? ?Notalgia paresthetica ?- Perispinal  hyperpigmented patch ?- Chronic condition without cure ?- Secondary to pinched nerve along spine causing itching or sensation changes in an area of skin. Chronic rubbing or scratching causes darkening of the skin.  ?- OTC treatments which can help with itch include numbing creams like pramoxine or lidocaine which temporarily reduce itch or Capsaicin-containing creams which cause a burning sensation but which sometimes over time will reset the nerves to stop producing itch.  ?- If you choose to use Capsaicin cream, it is recommended to use it 5 times daily for 1 week followed by 3 times daily for 3-6 weeks. You may have to continue using it long-term.  ?- For severe cases, there are some prescription cream or pill options which may help. ? ?Melanocytic Nevi ?- Tan-brown and/or pink-flesh-colored symmetric macules and papules ?- Benign appearing on exam today ?- Observation ?- Call clinic for new or changing moles ?- Recommend daily use of broad spectrum spf 30+ sunscreen to sun-exposed areas.  ? ?Hemangiomas ?- Red papules ?- Discussed benign nature ?- Observe ?- Call for any changes ? ?Actinic Damage ?- Chronic condition, secondary to cumulative UV/sun exposure ?- diffuse scaly erythematous macules with underlying dyspigmentation ?- Recommend daily broad spectrum sunscreen SPF 30+ to sun-exposed areas, reapply every 2 hours as needed.  ?- Staying in the shade or wearing long sleeves, sun glasses (UVA+UVB protection) and wide brim hats (4-inch brim around the entire circumference of the hat) are also recommended for  sun protection.  ?- Call for new or changing lesions. ? ?Skin cancer screening performed today. ?Return in about 1 year (around 08/16/2022) for TBSE. ?I, Ruthell Rummage, CMA, am acting as scribe for Sarina Ser, MD. ?Documentation: I have reviewed the above documentation for accuracy and completeness, and I agree with the above. ? ?Sarina Ser, MD ? ?

## 2021-08-23 ENCOUNTER — Encounter: Payer: Self-pay | Admitting: Dermatology

## 2021-09-05 DIAGNOSIS — H2512 Age-related nuclear cataract, left eye: Secondary | ICD-10-CM | POA: Diagnosis not present

## 2021-09-12 DIAGNOSIS — K640 First degree hemorrhoids: Secondary | ICD-10-CM | POA: Diagnosis not present

## 2021-09-12 DIAGNOSIS — K5730 Diverticulosis of large intestine without perforation or abscess without bleeding: Secondary | ICD-10-CM | POA: Diagnosis not present

## 2021-09-12 DIAGNOSIS — Z86010 Personal history of colonic polyps: Secondary | ICD-10-CM | POA: Diagnosis not present

## 2021-09-17 ENCOUNTER — Other Ambulatory Visit: Payer: Self-pay | Admitting: Internal Medicine

## 2021-09-17 DIAGNOSIS — I10 Essential (primary) hypertension: Secondary | ICD-10-CM

## 2021-09-23 ENCOUNTER — Encounter: Payer: Self-pay | Admitting: Ophthalmology

## 2021-09-25 ENCOUNTER — Other Ambulatory Visit: Payer: Self-pay | Admitting: Internal Medicine

## 2021-09-25 DIAGNOSIS — E785 Hyperlipidemia, unspecified: Secondary | ICD-10-CM

## 2021-09-26 NOTE — Discharge Instructions (Signed)

## 2021-10-02 ENCOUNTER — Ambulatory Visit
Admission: RE | Admit: 2021-10-02 | Discharge: 2021-10-02 | Disposition: A | Discharge status: Home / Self Care | Payer: Medicare HMO | Attending: Ophthalmology | Admitting: Ophthalmology

## 2021-10-02 ENCOUNTER — Encounter
Admission: RE | Disposition: A | Discharge status: Home / Self Care | Payer: Self-pay | Source: Home / Self Care | Attending: Ophthalmology

## 2021-10-02 ENCOUNTER — Ambulatory Visit: Payer: Medicare HMO | Admitting: Anesthesiology

## 2021-10-02 ENCOUNTER — Encounter: Payer: Self-pay | Admitting: Ophthalmology

## 2021-10-02 ENCOUNTER — Other Ambulatory Visit: Payer: Self-pay

## 2021-10-02 DIAGNOSIS — E785 Hyperlipidemia, unspecified: Secondary | ICD-10-CM | POA: Diagnosis not present

## 2021-10-02 DIAGNOSIS — K219 Gastro-esophageal reflux disease without esophagitis: Secondary | ICD-10-CM | POA: Insufficient documentation

## 2021-10-02 DIAGNOSIS — I10 Essential (primary) hypertension: Secondary | ICD-10-CM | POA: Diagnosis not present

## 2021-10-02 DIAGNOSIS — H2512 Age-related nuclear cataract, left eye: Secondary | ICD-10-CM | POA: Insufficient documentation

## 2021-10-02 DIAGNOSIS — M1990 Unspecified osteoarthritis, unspecified site: Secondary | ICD-10-CM | POA: Insufficient documentation

## 2021-10-02 DIAGNOSIS — H25812 Combined forms of age-related cataract, left eye: Secondary | ICD-10-CM | POA: Diagnosis not present

## 2021-10-02 HISTORY — DX: Gastro-esophageal reflux disease without esophagitis: K21.9

## 2021-10-02 HISTORY — PX: CATARACT EXTRACTION W/PHACO: SHX586

## 2021-10-02 HISTORY — DX: Unspecified osteoarthritis, unspecified site: M19.90

## 2021-10-02 SURGERY — PHACOEMULSIFICATION, CATARACT, WITH IOL INSERTION
Anesthesia: Monitor Anesthesia Care | Site: Eye | Laterality: Left

## 2021-10-02 MED ORDER — SIGHTPATH DOSE#1 NA HYALUR & NA CHOND-NA HYALUR IO KIT
PACK | INTRAOCULAR | Status: DC | PRN
  Administered 2021-10-02: 1 via OPHTHALMIC

## 2021-10-02 MED ORDER — SIGHTPATH DOSE#1 BSS IO SOLN
INTRAOCULAR | Status: DC | PRN
  Administered 2021-10-02: 50 mL via OPHTHALMIC

## 2021-10-02 MED ORDER — CEFUROXIME OPHTHALMIC INJECTION 1 MG/0.1 ML
INJECTION | OPHTHALMIC | Status: DC | PRN
  Administered 2021-10-02: 1 mg via OPHTHALMIC

## 2021-10-02 MED ORDER — FENTANYL CITRATE (PF) 100 MCG/2ML IJ SOLN
INTRAMUSCULAR | Status: DC | PRN
  Administered 2021-10-02: 50 ug via INTRAVENOUS

## 2021-10-02 MED ORDER — ACETAMINOPHEN 325 MG PO TABS: 325.0000 mg | ORAL_TABLET | ORAL | Status: DC | PRN

## 2021-10-02 MED ORDER — ONDANSETRON HCL 4 MG/2ML IJ SOLN: 4.0000 mg | Freq: Once | INTRAMUSCULAR | Status: DC | PRN

## 2021-10-02 MED ORDER — TETRACAINE HCL 0.5 % OP SOLN
1.0000 [drp] | OPHTHALMIC | Status: DC | PRN
  Administered 2021-10-02 (×3): 1 [drp] via OPHTHALMIC

## 2021-10-02 MED ORDER — BRIMONIDINE TARTRATE-TIMOLOL 0.2-0.5 % OP SOLN
OPHTHALMIC | Status: DC | PRN
  Administered 2021-10-02: 1 [drp] via OPHTHALMIC

## 2021-10-02 MED ORDER — ARMC OPHTHALMIC DILATING DROPS
1.0000 "application " | OPHTHALMIC | Status: DC | PRN
  Administered 2021-10-02 (×3): 1 via OPHTHALMIC

## 2021-10-02 MED ORDER — ACETAMINOPHEN 160 MG/5ML PO SOLN: 325.0000 mg | ORAL | Status: DC | PRN

## 2021-10-02 MED ORDER — SIGHTPATH DOSE#1 BSS IO SOLN
INTRAOCULAR | Status: DC | PRN
  Administered 2021-10-02: 15 mL

## 2021-10-02 MED ORDER — SIGHTPATH DOSE#1 BSS IO SOLN
INTRAOCULAR | Status: DC | PRN
  Administered 2021-10-02: 1 mL via INTRAMUSCULAR

## 2021-10-02 MED ORDER — MIDAZOLAM HCL 2 MG/2ML IJ SOLN
INTRAMUSCULAR | Status: DC | PRN
  Administered 2021-10-02: 1 mg via INTRAVENOUS

## 2021-10-02 SURGICAL SUPPLY — 12 items
CATARACT SUITE SIGHTPATH (MISCELLANEOUS) ×2 IMPLANT
FEE CATARACT SUITE SIGHTPATH (MISCELLANEOUS) ×1 IMPLANT
GLOVE SRG 8 PF TXTR STRL LF DI (GLOVE) ×1 IMPLANT
GLOVE SURG ENC TEXT LTX SZ7.5 (GLOVE) ×2 IMPLANT
GLOVE SURG UNDER POLY LF SZ8 (GLOVE) ×2
LENS IOL DIOP 16.5 (Intraocular Lens) ×2 IMPLANT
LENS IOL TECNIS MONO 16.5 (Intraocular Lens) IMPLANT
NDL FILTER BLUNT 18X1 1/2 (NEEDLE) ×1 IMPLANT
NEEDLE FILTER BLUNT 18X 1/2SAF (NEEDLE) ×1
NEEDLE FILTER BLUNT 18X1 1/2 (NEEDLE) ×1 IMPLANT
SYR 3ML LL SCALE MARK (SYRINGE) ×2 IMPLANT
WATER STERILE IRR 250ML POUR (IV SOLUTION) ×2 IMPLANT

## 2021-10-02 NOTE — Op Note (Signed)
OPERATIVE NOTE  Melissa [name] [id number] 10/02/2021   PREOPERATIVE DIAGNOSIS:  Nuclear sclerotic cataract left eye. H25.12   POSTOPERATIVE DIAGNOSIS:    Nuclear sclerotic cataract left eye.     PROCEDURE:  Phacoemusification with posterior chamber intraocular lens placement of the left eye  Ultrasound time: Procedure(s) with comments: CATARACT EXTRACTION PHACO AND INTRAOCULAR LENS PLACEMENT (IOC) LEFT (Left) - 3.83 00:45.7  LENS:   Implant Name Type Inv. Item Serial No. Manufacturer Lot No. LRB No. Used Action  LENS IOL DIOP 16.5 - [id number] Intraocular Lens LENS IOL DIOP 16.5 [id number] SIGHTPATH  Left 1 Implanted      SURGEON:  Wyonia Hough, MD   ANESTHESIA:  Topical with tetracaine drops and 2% Xylocaine jelly, augmented with 1% preservative-free intracameral lidocaine.    COMPLICATIONS:  None.   DESCRIPTION OF PROCEDURE:  The patient was identified in the holding room and transported to the operating room and placed in the supine position under the operating microscope.  The left eye was identified as the operative eye and it was prepped and draped in the usual sterile ophthalmic fashion.   A 1 millimeter clear-corneal paracentesis was made at the 1:30 position.  0.5 ml of preservative-free 1% lidocaine was injected into the anterior chamber.  The anterior chamber was filled with Viscoat viscoelastic.  A 2.4 millimeter keratome was used to make a near-clear corneal incision at the 10:30 position.  .  A curvilinear capsulorrhexis was made with a cystotome and capsulorrhexis forceps.  Balanced salt solution was used to hydrodissect and hydrodelineate the nucleus.   Phacoemulsification was then used in stop and chop fashion to remove the lens nucleus and epinucleus.  The remaining cortex was then removed using the irrigation and aspiration handpiece. Provisc was then placed into the capsular bag to distend it for lens placement.  A lens was then injected into the  capsular bag.  The remaining viscoelastic was aspirated.   Wounds were hydrated with balanced salt solution.  The anterior chamber was inflated to a physiologic pressure with balanced salt solution.  No wound leaks were noted. Cefuroxime 0.1 ml of a '10mg'$ /ml solution was injected into the anterior chamber for a dose of 1 mg of intracameral antibiotic at the completion of the case.   Timolol and Brimonidine drops were applied to the eye.  The patient was taken to the recovery room in stable condition without complications of anesthesia or surgery.  [name] 10/02/2021, 11:43 AM

## 2021-10-02 NOTE — Transfer of Care (Signed)
Immediate Anesthesia Transfer of Care Note  Patient: Melissa [name]  Procedure(s) Performed: CATARACT EXTRACTION PHACO AND INTRAOCULAR LENS PLACEMENT (IOC) LEFT (Left: Eye)  Patient Location: PACU  Anesthesia Type: MAC  Level of Consciousness: awake, alert  and patient cooperative  Airway and Oxygen Therapy: Patient Spontanous Breathing and Patient connected to supplemental oxygen  Post-op Assessment: Post-op Vital signs reviewed, Patient's Cardiovascular Status Stable, Respiratory Function Stable, Patent Airway and No signs of Nausea or vomiting  Post-op Vital Signs: Reviewed and stable  Complications: No notable events documented.

## 2021-10-02 NOTE — Anesthesia Preprocedure Evaluation (Signed)
Anesthesia Evaluation  Patient identified by MRN, date of birth, ID band Patient awake    Reviewed: Allergy & Precautions, NPO status   Airway Mallampati: II  TM Distance: >3 FB     Dental   Pulmonary neg pulmonary ROS,    Pulmonary exam normal        Cardiovascular hypertension,  Rhythm:Regular Rate:Normal  HLD   Neuro/Psych    GI/Hepatic GERD  ,  Endo/Other    Renal/GU      Musculoskeletal  (+) Arthritis ,   Abdominal   Peds  Hematology   Anesthesia Other Findings   Reproductive/Obstetrics                             Anesthesia Physical Anesthesia Plan  ASA: 2  Anesthesia Plan: MAC   Post-op Pain Management: Minimal or no pain anticipated   Induction: Intravenous  PONV Risk Score and Plan: TIVA, Midazolam and Treatment may vary due to age or medical condition  Airway Management Planned: Natural Airway and Nasal Cannula  Additional Equipment:   Intra-op Plan:   Post-operative Plan:   Informed Consent: I have reviewed the patients History and Physical, chart, labs and discussed the procedure including the risks, benefits and alternatives for the proposed anesthesia with the patient or authorized representative who has indicated his/her understanding and acceptance.       Plan Discussed with: CRNA  Anesthesia Plan Comments:         Anesthesia Quick Evaluation

## 2021-10-02 NOTE — Anesthesia Postprocedure Evaluation (Signed)
Anesthesia Post Note  Patient: Melissa [name]  Procedure(s) Performed: CATARACT EXTRACTION PHACO AND INTRAOCULAR LENS PLACEMENT (IOC) LEFT (Left: Eye)     Patient location during evaluation: PACU Anesthesia Type: MAC Level of consciousness: awake Pain management: pain level controlled Vital Signs Assessment: post-procedure vital signs reviewed and stable Respiratory status: respiratory function stable Cardiovascular status: stable Postop Assessment: no apparent nausea or vomiting Anesthetic complications: no   No notable events documented.  Veda Canning

## 2021-10-02 NOTE — H&P (Signed)
[facility]   Primary Care Physician:  McLean-Scocuzza, Nino Glow, MD Ophthalmologist: Dr. Leandrew Koyanagi  Pre-Procedure History & Physical: HPI:  Melissa [name] is a 66 y.o. female here for ophthalmic surgery.   Past Medical History:  Diagnosis Date   Actinic keratoses    Allergy    Arthritis    Chicken pox    Colon polyps    Dermatitis    GERD (gastroesophageal reflux disease)    Heart murmur    youth   Hyperlipidemia    Hypertension    Obesity    Urgency incontinence     Past Surgical History:  Procedure Laterality Date   BUNIONECTOMY Bilateral    COLONOSCOPY     COLONOSCOPY WITH PROPOFOL N/A 04/18/2016   Procedure: COLONOSCOPY WITH PROPOFOL;  Surgeon: Manya Silvas, MD;  Location: [facility] ENDOSCOPY;  Service: Endoscopy;  Laterality: N/A;   TONSILLECTOMY AND ADENOIDECTOMY      Prior to Admission medications   Medication Sig Start Date End Date Taking? Authorizing Provider  amLODipine (NORVASC) 2.5 MG tablet TAKE 1 TABLET (2.5 MG TOTAL) BY MOUTH DAILY. CAN TAKE ADDITIONAL DOSE IF BP >130/>80 12/25/20  Yes McLean-Scocuzza, Nino Glow, MD  aspirin EC 81 MG tablet Take 81 mg by mouth daily.   Yes [provider]  atorvastatin (LIPITOR) 10 MG tablet TAKE 1 TABLET (10 MG TOTAL) BY MOUTH DAILY AT 6 PM. 09/25/21  Yes McLean-Scocuzza, Nino Glow, MD  betamethasone, augmented, (DIPROLENE) 0.05 % gel Apply to aa's rash BID PRN only use when needed 08/15/21  Yes Ralene Bathe, MD  Calcium-Magnesium-Vitamin D (CALCIUM 1200+D3 PO) Take by mouth 2 (two) times daily.   Yes [provider]  Crisaborole (EUCRISA) 2 % OINT Apply 1 application. topically in the morning and at bedtime. 08/15/21  Yes Ralene Bathe, MD  docusate sodium (COLACE) 50 MG capsule Take 1 capsule (50 mg total) by mouth daily as needed for mild constipation. 09/12/20  Yes McLean-Scocuzza, Nino Glow, MD  Multiple Vitamin (MULTI-VITAMINS) TABS Take by mouth.   Yes [provider]   telmisartan (MICARDIS) 20 MG tablet TAKE 1 TABLET BY MOUTH EVERY DAY 09/18/21  Yes McLean-Scocuzza, Nino Glow, MD  Trospium Chloride 60 MG CP24 Take 1 capsule (60 mg total) by mouth daily. In the am d/c oxybutynin 09/12/20  Yes McLean-Scocuzza, Nino Glow, MD  amoxicillin-clavulanate (AUGMENTIN) 875-125 MG tablet Take 1 tablet by mouth 2 (two) times daily. With food Patient not taking: Reported on 09/23/2021 07/10/21   McLean-Scocuzza, Nino Glow, MD  fexofenadine-pseudoephedrine (ALLEGRA-D) 60-120 MG 12 hr tablet Take 1 tablet by mouth 2 (two) times daily. 04/09/21   Sable Feil, PA-C  HYDROcodone bit-homatropine (HYCODAN) 5-1.5 MG/5ML syrup Take 5 mLs by mouth every 8 (eight) hours as needed for cough. Patient not taking: Reported on 09/23/2021 04/09/21   Sable Feil, PA-C  HYDROcodone bit-homatropine [facility]) 5-1.5 MG/5ML syrup Take 5 mLs by mouth at bedtime as needed for cough. Patient not taking: Reported on 09/23/2021 07/10/21   McLean-Scocuzza, Nino Glow, MD  SENEXON-S 8.6-50 MG tablet TAKE 1 TABLET BY MOUTH DAILY AS NEEDED FOR CONSTIPATION. 09/26/21   McLean-Scocuzza, Nino Glow, MD  triamcinolone ointment (KENALOG) 0.1 % Apply 1 application topically 2 (two) times daily as needed.    [provider]    Allergies as of 07/17/2021 - Review Complete 07/10/2021  Allergen Reaction Noted   Terfenadine Rash 07/18/2016    Family History  Problem Relation Age of Onset  Arthritis Mother    Diabetes Mother    Stroke Mother    Arthritis Father    Diabetes Father    Early death Father    Heart disease Father        MI 57 y.o    Diabetes Sister    Breast cancer Neg Hx     Social History   Socioeconomic History   Marital status: Married    Spouse name: Not on file   Number of children: Not on file   Years of education: Not on file   Highest education level: Not on file  Occupational History   Not on file  Tobacco Use   Smoking status: Never   Smokeless tobacco: Never  Substance and  Sexual Activity   Alcohol use: Not on file    Comment: occasionally wine    Drug use: No   Sexual activity: Never  Other Topics Concern   Not on file  Social History Narrative   Married    1 daughter age 4 as of 03/2017    Retired   Science writer Determinants of Radio broadcast assistant Strain: Low Risk    Difficulty of Paying Living Expenses: Not hard at all  Food Insecurity: No Food Insecurity   Worried About Charity fundraiser in the Last Year: Never true   Arboriculturist in the Last Year: Never true  Transportation Needs: No Transportation Needs   Lack of Transportation (Medical): No   Lack of Transportation (Non-Medical): No  Physical Activity: Insufficiently Active   Days of Exercise per Week: 4 days   Minutes of Exercise per Session: 20 min  Stress: No Stress Concern Present   Feeling of Stress : Not at all  Social Connections: Unknown   Frequency of Communication with Friends and Family: Never   Frequency of Social Gatherings with Friends and Family: Never   Attends Religious Services: Never   Printmaker: Not on file   Attends Archivist Meetings: Never   Marital Status: Not on file  Intimate Partner Violence: Not At Risk   Fear of Current or Ex-Partner: No   Emotionally Abused: No   Physically Abused: No   Sexually Abused: No    Review of Systems: See HPI, otherwise negative ROS  Physical Exam: Ht '5\' 5"'$  (1.651 m)   Wt 76.7 kg   BMI 28.12 kg/m  General:   Alert,  pleasant and cooperative in NAD Head:  Normocephalic and atraumatic. Lungs:  Clear to auscultation.    Heart:  Regular rate and rhythm.   Impression/Plan: Melissa [name] is here for ophthalmic surgery.  Risks, benefits, limitations, and alternatives regarding ophthalmic surgery have been reviewed with the patient.  Questions have been answered.  All parties agreeable.   Leandrew Koyanagi, MD  10/02/2021, 10:32 AM

## 2021-10-08 DIAGNOSIS — H2511 Age-related nuclear cataract, right eye: Secondary | ICD-10-CM | POA: Diagnosis not present

## 2021-10-12 ENCOUNTER — Other Ambulatory Visit: Payer: Self-pay | Admitting: Internal Medicine

## 2021-10-12 DIAGNOSIS — N3281 Overactive bladder: Secondary | ICD-10-CM

## 2021-10-14 NOTE — Discharge Instructions (Signed)

## 2021-10-16 ENCOUNTER — Encounter: Payer: Self-pay | Admitting: Ophthalmology

## 2021-10-16 ENCOUNTER — Ambulatory Visit
Admission: RE | Admit: 2021-10-16 | Discharge: 2021-10-16 | Disposition: A | Discharge status: Home / Self Care | Payer: Medicare HMO | Attending: Ophthalmology | Admitting: Ophthalmology

## 2021-10-16 ENCOUNTER — Ambulatory Visit: Payer: Medicare HMO | Admitting: Anesthesiology

## 2021-10-16 ENCOUNTER — Encounter
Admission: RE | Disposition: A | Discharge status: Home / Self Care | Payer: Self-pay | Source: Home / Self Care | Attending: Ophthalmology

## 2021-10-16 ENCOUNTER — Other Ambulatory Visit: Payer: Self-pay

## 2021-10-16 DIAGNOSIS — H2511 Age-related nuclear cataract, right eye: Secondary | ICD-10-CM | POA: Diagnosis not present

## 2021-10-16 DIAGNOSIS — I10 Essential (primary) hypertension: Secondary | ICD-10-CM | POA: Diagnosis not present

## 2021-10-16 DIAGNOSIS — H25811 Combined forms of age-related cataract, right eye: Secondary | ICD-10-CM | POA: Diagnosis not present

## 2021-10-16 DIAGNOSIS — E785 Hyperlipidemia, unspecified: Secondary | ICD-10-CM | POA: Insufficient documentation

## 2021-10-16 HISTORY — PX: CATARACT EXTRACTION W/PHACO: SHX586

## 2021-10-16 SURGERY — PHACOEMULSIFICATION, CATARACT, WITH IOL INSERTION
Anesthesia: Monitor Anesthesia Care | Site: Eye | Laterality: Right

## 2021-10-16 MED ORDER — CEFUROXIME OPHTHALMIC INJECTION 1 MG/0.1 ML
INJECTION | OPHTHALMIC | Status: DC | PRN
  Administered 2021-10-16: 0.1 mL via INTRACAMERAL

## 2021-10-16 MED ORDER — SIGHTPATH DOSE#1 BSS IO SOLN
INTRAOCULAR | Status: DC | PRN
  Administered 2021-10-16: 15 mL

## 2021-10-16 MED ORDER — SIGHTPATH DOSE#1 NA HYALUR & NA CHOND-NA HYALUR IO KIT
PACK | INTRAOCULAR | Status: DC | PRN
  Administered 2021-10-16: 1 via OPHTHALMIC

## 2021-10-16 MED ORDER — BRIMONIDINE TARTRATE-TIMOLOL 0.2-0.5 % OP SOLN
OPHTHALMIC | Status: DC | PRN
  Administered 2021-10-16: 1 [drp] via OPHTHALMIC

## 2021-10-16 MED ORDER — TETRACAINE HCL 0.5 % OP SOLN
1.0000 [drp] | OPHTHALMIC | Status: DC | PRN
  Administered 2021-10-16 (×3): 1 [drp] via OPHTHALMIC

## 2021-10-16 MED ORDER — LACTATED RINGERS IV SOLN: INTRAVENOUS | Status: DC

## 2021-10-16 MED ORDER — FENTANYL CITRATE (PF) 100 MCG/2ML IJ SOLN
INTRAMUSCULAR | Status: DC | PRN
Start: 2021-10-16 — End: 2021-10-16
  Administered 2021-10-16: 50 ug via INTRAVENOUS

## 2021-10-16 MED ORDER — SIGHTPATH DOSE#1 BSS IO SOLN
INTRAOCULAR | Status: DC | PRN
  Administered 2021-10-16: 1 mL via INTRAMUSCULAR

## 2021-10-16 MED ORDER — MIDAZOLAM HCL 2 MG/2ML IJ SOLN
INTRAMUSCULAR | Status: DC | PRN
  Administered 2021-10-16 (×2): 1 mg via INTRAVENOUS

## 2021-10-16 MED ORDER — ARMC OPHTHALMIC DILATING DROPS
1.0000 "application " | OPHTHALMIC | Status: DC | PRN
  Administered 2021-10-16 (×3): 1 via OPHTHALMIC

## 2021-10-16 MED ORDER — SIGHTPATH DOSE#1 BSS IO SOLN
INTRAOCULAR | Status: DC | PRN
  Administered 2021-10-16: 51 mL via OPHTHALMIC

## 2021-10-16 SURGICAL SUPPLY — 11 items
CATARACT SUITE SIGHTPATH (MISCELLANEOUS) ×2 IMPLANT
FEE CATARACT SUITE SIGHTPATH (MISCELLANEOUS) ×1 IMPLANT
GLOVE SRG 8 PF TXTR STRL LF DI (GLOVE) ×1 IMPLANT
GLOVE SURG ENC TEXT LTX SZ7.5 (GLOVE) ×2 IMPLANT
GLOVE SURG UNDER POLY LF SZ8 (GLOVE) ×2
LENS IOL TECNIS EYHANCE 17.0 (Intraocular Lens) ×1 IMPLANT
NDL FILTER BLUNT 18X1 1/2 (NEEDLE) ×1 IMPLANT
NEEDLE FILTER BLUNT 18X 1/2SAF (NEEDLE) ×1
NEEDLE FILTER BLUNT 18X1 1/2 (NEEDLE) ×1 IMPLANT
SYR 3ML LL SCALE MARK (SYRINGE) ×2 IMPLANT
WATER STERILE IRR 250ML POUR (IV SOLUTION) ×2 IMPLANT

## 2021-10-16 NOTE — H&P (Signed)
[facility]   Primary Care Physician:  McLean-Scocuzza, Nino Glow, MD Ophthalmologist: Dr. Leandrew Koyanagi  Pre-Procedure History & Physical: HPI:  Melissa [name] is a 66 y.o. female here for ophthalmic surgery.   Past Medical History:  Diagnosis Date   Actinic keratoses    Allergy    Arthritis    Chicken pox    Colon polyps    Dermatitis    GERD (gastroesophageal reflux disease)    Heart murmur    youth   Hyperlipidemia    Hypertension    Urgency incontinence     Past Surgical History:  Procedure Laterality Date   BUNIONECTOMY Bilateral    CATARACT EXTRACTION W/PHACO Left 10/02/2021   Procedure: CATARACT EXTRACTION PHACO AND INTRAOCULAR LENS PLACEMENT ([place]) LEFT;  Surgeon: Leandrew Koyanagi, MD;  Location: [place];  Service: Ophthalmology;  Laterality: Left;  3.83 00:45.7   COLONOSCOPY     COLONOSCOPY WITH PROPOFOL N/A 04/18/2016   Procedure: COLONOSCOPY WITH PROPOFOL;  Surgeon: Manya Silvas, MD;  Location: [facility] ENDOSCOPY;  Service: Endoscopy;  Laterality: N/A;   TONSILLECTOMY AND ADENOIDECTOMY      Prior to Admission medications   Medication Sig Start Date End Date Taking? Authorizing Provider  amLODipine (NORVASC) 2.5 MG tablet TAKE 1 TABLET (2.5 MG TOTAL) BY MOUTH DAILY. CAN TAKE ADDITIONAL DOSE IF BP >130/>80 12/25/20  Yes McLean-Scocuzza, Nino Glow, MD  amoxicillin-clavulanate (AUGMENTIN) 875-125 MG tablet Take 1 tablet by mouth 2 (two) times daily. With food 07/10/21  Yes McLean-Scocuzza, Nino Glow, MD  aspirin EC 81 MG tablet Take 81 mg by mouth daily.   Yes [provider]  atorvastatin (LIPITOR) 10 MG tablet TAKE 1 TABLET (10 MG TOTAL) BY MOUTH DAILY AT 6 PM. 09/25/21  Yes McLean-Scocuzza, Nino Glow, MD  betamethasone, augmented, (DIPROLENE) 0.05 % gel Apply to aa's rash BID PRN only use when needed 08/15/21  Yes Ralene Bathe, MD  Calcium-Magnesium-Vitamin D (CALCIUM 1200+D3 PO) Take by mouth 2 (two) times daily.   Yes [provider]  Crisaborole (EUCRISA) 2 % OINT Apply 1 application. topically in the morning and at bedtime. 08/15/21  Yes Ralene Bathe, MD  docusate sodium (COLACE) 50 MG capsule Take 1 capsule (50 mg total) by mouth daily as needed for mild constipation. 09/12/20  Yes McLean-Scocuzza, Nino Glow, MD  fexofenadine-pseudoephedrine (ALLEGRA-D) 60-120 MG 12 hr tablet Take 1 tablet by mouth 2 (two) times daily. 04/09/21  Yes Sable Feil, PA-C  HYDROcodone bit-homatropine (HYCODAN) 5-1.5 MG/5ML syrup Take 5 mLs by mouth every 8 (eight) hours as needed for cough. 04/09/21  Yes Sable Feil, PA-C  HYDROcodone bit-homatropine (HYCODAN) 5-1.5 MG/5ML syrup Take 5 mLs by mouth at bedtime as needed for cough. 07/10/21  Yes McLean-Scocuzza, Nino Glow, MD  Multiple Vitamin (MULTI-VITAMINS) TABS Take by mouth.   Yes [provider]  SENEXON-S 8.6-50 MG tablet TAKE 1 TABLET BY MOUTH DAILY AS NEEDED FOR CONSTIPATION. 09/26/21  Yes McLean-Scocuzza, Nino Glow, MD  telmisartan (MICARDIS) 20 MG tablet TAKE 1 TABLET BY MOUTH EVERY DAY 09/18/21  Yes McLean-Scocuzza, Nino Glow, MD  triamcinolone ointment (KENALOG) 0.1 % Apply 1 application topically 2 (two) times daily as needed.   Yes [provider]  Trospium Chloride 60 MG CP24 TAKE 1 CAPSULE (60 MG TOTAL) BY MOUTH DAILY IN THE MORNING. STOP OXYBUTYNIN 10/14/21  Yes McLean-Scocuzza, Nino Glow, MD    Allergies as of 07/17/2021 - Review Complete 07/10/2021  Allergen Reaction Noted  Terfenadine Rash 07/18/2016    Family History  Problem Relation Age of Onset   Arthritis Mother    Diabetes Mother    Stroke Mother    Arthritis Father    Diabetes Father    Early death Father    Heart disease Father        MI 36 y.o    Diabetes Sister    Breast cancer Neg Hx     Social History   Socioeconomic History   Marital status: Married    Spouse name: Not on file   Number of children: Not on file   Years of education: Not on file   Highest education  level: Not on file  Occupational History   Not on file  Tobacco Use   Smoking status: Never   Smokeless tobacco: Never  Substance and Sexual Activity   Alcohol use: Not on file    Comment: occasionally wine    Drug use: No   Sexual activity: Never  Other Topics Concern   Not on file  Social History Narrative   Married    1 daughter age 85 as of 03/2017    Retired   Social Determinants of Radio broadcast assistant Strain: [place]  (12/19/2020)   Overall Financial Resource Strain ([place])    Difficulty of Paying Living Expenses: Not hard at all  Food Insecurity: No Food Insecurity (12/19/2020)   Hunger Vital Sign    Worried About Running Out of Food in the Last Year: Never true    Ran Out of Food in the Last Year: Never true  Transportation Needs: No Transportation Needs (12/19/2020)   PRAPARE - Hydrologist (Medical): No    Lack of Transportation (Non-Medical): No  Physical Activity: Insufficiently Active (12/19/2020)   Exercise Vital Sign    Days of Exercise per Week: 4 days    Minutes of Exercise per Session: 20 min  Stress: No Stress Concern Present (12/19/2020)   [place]    Feeling of Stress : Not at all  Social Connections: Unknown (12/19/2020)   Social Connection and Isolation Panel [NHANES]    Frequency of Communication with Friends and Family: Never    Frequency of Social Gatherings with Friends and Family: Never    Attends Religious Services: Never    Marine scientist or Organizations: Not on file    Attends Archivist Meetings: Never    Marital Status: Not on file  Intimate Partner Violence: Not At Risk (12/19/2020)   Humiliation, Afraid, Rape, and Kick questionnaire    Fear of Current or Ex-Partner: No    Emotionally Abused: No    Physically Abused: No    Sexually Abused: No    Review of Systems: See HPI, otherwise negative ROS  Physical  Exam: BP 117/69   Pulse 68   Temp (!) 97.5 F (36.4 C) (Temporal)   Resp 14   Wt 78.5 kg   SpO2 100%   BMI 28.79 kg/m  General:   Alert,  pleasant and cooperative in NAD Head:  Normocephalic and atraumatic. Lungs:  Clear to auscultation.    Heart:  Regular rate and rhythm.   Impression/Plan: Melissa [name] is here for ophthalmic surgery.  Risks, benefits, limitations, and alternatives regarding ophthalmic surgery have been reviewed with the patient.  Questions have been answered.  All parties agreeable.   Leandrew Koyanagi, MD  10/16/2021, 11:19 AM

## 2021-10-16 NOTE — Anesthesia Preprocedure Evaluation (Signed)
Anesthesia Evaluation  Patient identified by MRN, date of birth, ID band Patient awake    Reviewed: Allergy & Precautions, NPO status   Airway Mallampati: II  TM Distance: >3 FB Neck ROM: Full    Dental no notable dental hx.    Pulmonary neg pulmonary ROS,    Pulmonary exam normal        Cardiovascular hypertension, Normal cardiovascular exam  HLD   Neuro/Psych negative neurological ROS  negative psych ROS   GI/Hepatic Neg liver ROS, GERD  ,  Endo/Other  negative endocrine ROS  Renal/GU negative Renal ROS     Musculoskeletal  (+) Arthritis ,   Abdominal Normal abdominal exam  (+)   Peds  Hematology   Anesthesia Other Findings   Reproductive/Obstetrics                             Anesthesia Physical  Anesthesia Plan  ASA: 2  Anesthesia Plan: MAC   Post-op Pain Management: Minimal or no pain anticipated   Induction: Intravenous  PONV Risk Score and Plan: 2 and TIVA, Midazolam and Treatment may vary due to age or medical condition  Airway Management Planned: Natural Airway and Nasal Cannula  Additional Equipment:   Intra-op Plan:   Post-operative Plan:   Informed Consent: I have reviewed the patients History and Physical, chart, labs and discussed the procedure including the risks, benefits and alternatives for the proposed anesthesia with the patient or authorized representative who has indicated his/her understanding and acceptance.     Dental advisory given  Plan Discussed with: CRNA  Anesthesia Plan Comments:         Anesthesia Quick Evaluation

## 2021-10-16 NOTE — Transfer of Care (Signed)
Immediate Anesthesia Transfer of Care Note  Patient: Melissa [name]  Procedure(s) Performed: CATARACT EXTRACTION PHACO AND INTRAOCULAR LENS PLACEMENT (IOC) RIGHT 5.10 00:58.2 (Right: Eye)  Patient Location: PACU  Anesthesia Type: MAC  Level of Consciousness: awake, alert  and patient cooperative  Airway and Oxygen Therapy: Patient Spontanous Breathing and Patient connected to supplemental oxygen  Post-op Assessment: Post-op Vital signs reviewed, Patient's Cardiovascular Status Stable, Respiratory Function Stable, Patent Airway and No signs of Nausea or vomiting  Post-op Vital Signs: Reviewed and stable  Complications: No notable events documented.

## 2021-10-16 NOTE — Op Note (Signed)
  LOCATION:  [place]   PREOPERATIVE DIAGNOSIS:    Nuclear sclerotic cataract right eye. H25.11   POSTOPERATIVE DIAGNOSIS:  Nuclear sclerotic cataract right eye.     PROCEDURE:  Phacoemusification with posterior chamber intraocular lens placement of the right eye   ULTRASOUND TIME: Procedure(s): CATARACT EXTRACTION PHACO AND INTRAOCULAR LENS PLACEMENT (IOC) RIGHT 5.10 00:58.2 (Right)  LENS:   Implant Name Type Inv. Item Serial No. Manufacturer Lot No. LRB No. Used Action  LENS IOL TECNIS EYHANCE 17.0 - [id number] Intraocular Lens LENS IOL TECNIS EYHANCE 17.0 [id number] [employer]PATH  Right 1 Implanted         SURGEON:  Wyonia Hough, MD   ANESTHESIA:  Topical with tetracaine drops and 2% Xylocaine jelly, augmented with 1% preservative-free intracameral lidocaine.    COMPLICATIONS:  None.   DESCRIPTION OF PROCEDURE:  The patient was identified in the holding room and transported to the operating room and placed in the supine position under the operating microscope.  The right eye was identified as the operative eye and it was prepped and draped in the usual sterile ophthalmic fashion.   A 1 millimeter clear-corneal paracentesis was made at the 12:00 position.  0.5 ml of preservative-free 1% lidocaine was injected into the anterior chamber. The anterior chamber was filled with Viscoat viscoelastic.  A 2.4 millimeter keratome was used to make a near-clear corneal incision at the 9:00 position.  A curvilinear capsulorrhexis was made with a cystotome and capsulorrhexis forceps.  Balanced salt solution was used to hydrodissect and hydrodelineate the nucleus.   Phacoemulsification was then used in stop and chop fashion to remove the lens nucleus and epinucleus.  The remaining cortex was then removed using the irrigation and aspiration handpiece. Provisc was then placed into the capsular bag to distend it for lens placement.  A lens was then injected into the capsular bag.   The remaining viscoelastic was aspirated.   Wounds were hydrated with balanced salt solution.  The anterior chamber was inflated to a physiologic pressure with balanced salt solution.  No wound leaks were noted. Cefuroxime 0.1 ml of a '10mg'$ /ml solution was injected into the anterior chamber for a dose of 1 mg of intracameral antibiotic at the completion of the case.   Timolol and Brimonidine drops were applied to the eye.  The patient was taken to the recovery room in stable condition without complications of anesthesia or surgery.   Lanaya Bennis 10/16/2021, 12:37 PM

## 2021-10-16 NOTE — Anesthesia Procedure Notes (Signed)
Procedure Name: MAC Date/Time: 10/16/2021 12:14 PM  Performed by: Dionne Bucy, CRNAPre-anesthesia Checklist: Patient identified, Emergency Drugs available, Suction available, Patient being monitored and Timeout performed Patient Re-evaluated:Patient Re-evaluated prior to induction Oxygen Delivery Method: Nasal cannula Placement Confirmation: positive ETCO2

## 2021-10-16 NOTE — Anesthesia Postprocedure Evaluation (Signed)
Anesthesia Post Note  Patient: Melissa [name]  Procedure(s) Performed: CATARACT EXTRACTION PHACO AND INTRAOCULAR LENS PLACEMENT (IOC) RIGHT 5.10 00:58.2 (Right: Eye)     Patient location during evaluation: PACU Anesthesia Type: MAC Level of consciousness: awake and alert Pain management: pain level controlled Vital Signs Assessment: post-procedure vital signs reviewed and stable Respiratory status: nonlabored ventilation and spontaneous breathing Cardiovascular status: blood pressure returned to baseline Postop Assessment: no apparent nausea or vomiting Anesthetic complications: no   No notable events documented.  Derreck Wiltsey [employer]

## 2021-10-17 ENCOUNTER — Encounter: Payer: Self-pay | Admitting: Ophthalmology

## 2021-11-11 DIAGNOSIS — Z961 Presence of intraocular lens: Secondary | ICD-10-CM | POA: Diagnosis not present

## 2021-12-09 DIAGNOSIS — M7732 Calcaneal spur, left foot: Secondary | ICD-10-CM | POA: Diagnosis not present

## 2021-12-09 DIAGNOSIS — M79672 Pain in left foot: Secondary | ICD-10-CM | POA: Diagnosis not present

## 2021-12-09 DIAGNOSIS — M722 Plantar fascial fibromatosis: Secondary | ICD-10-CM | POA: Diagnosis not present

## 2021-12-19 ENCOUNTER — Telehealth: Payer: Self-pay | Admitting: Internal Medicine

## 2021-12-19 NOTE — Telephone Encounter (Signed)
Copied from [place] [id number]. Topic: Medicare AWV >> Dec 19, 2021 10:39 AM Devoria Glassing wrote: Reason for CRM: Left message for patient to schedule Annual Wellness Visit.  Please schedule with Nurse Health Advisor Denisa O'Brien-Blaney, LPN at [facility] This appt can be telephone or office visit.  Please call [phone] ask for [facility]

## 2021-12-22 ENCOUNTER — Other Ambulatory Visit: Payer: Self-pay | Admitting: Internal Medicine

## 2021-12-22 DIAGNOSIS — I10 Essential (primary) hypertension: Secondary | ICD-10-CM

## 2021-12-30 DIAGNOSIS — M722 Plantar fascial fibromatosis: Secondary | ICD-10-CM | POA: Diagnosis not present

## 2022-01-01 ENCOUNTER — Ambulatory Visit (INDEPENDENT_AMBULATORY_CARE_PROVIDER_SITE_OTHER): Payer: Medicare HMO | Admitting: Internal Medicine

## 2022-01-01 ENCOUNTER — Encounter: Payer: Self-pay | Admitting: Internal Medicine

## 2022-01-01 VITALS — BP 120/64 | HR 57 | Temp 97.7°F | Ht 65.0 in | Wt 169.0 lb

## 2022-01-01 DIAGNOSIS — R5383 Other fatigue: Secondary | ICD-10-CM

## 2022-01-01 DIAGNOSIS — Z23 Encounter for immunization: Secondary | ICD-10-CM | POA: Diagnosis not present

## 2022-01-01 DIAGNOSIS — N179 Acute kidney failure, unspecified: Secondary | ICD-10-CM

## 2022-01-01 DIAGNOSIS — R7303 Prediabetes: Secondary | ICD-10-CM | POA: Diagnosis not present

## 2022-01-01 DIAGNOSIS — E785 Hyperlipidemia, unspecified: Secondary | ICD-10-CM | POA: Diagnosis not present

## 2022-01-01 DIAGNOSIS — I10 Essential (primary) hypertension: Secondary | ICD-10-CM

## 2022-01-01 DIAGNOSIS — N3281 Overactive bladder: Secondary | ICD-10-CM

## 2022-01-01 DIAGNOSIS — Z1389 Encounter for screening for other disorder: Secondary | ICD-10-CM

## 2022-01-01 DIAGNOSIS — Z1231 Encounter for screening mammogram for malignant neoplasm of breast: Secondary | ICD-10-CM

## 2022-01-01 DIAGNOSIS — N3000 Acute cystitis without hematuria: Secondary | ICD-10-CM | POA: Diagnosis not present

## 2022-01-01 DIAGNOSIS — Z1329 Encounter for screening for other suspected endocrine disorder: Secondary | ICD-10-CM

## 2022-01-01 DIAGNOSIS — N1831 Chronic kidney disease, stage 3a: Secondary | ICD-10-CM

## 2022-01-01 DIAGNOSIS — Z0000 Encounter for general adult medical examination without abnormal findings: Secondary | ICD-10-CM

## 2022-01-01 MED ORDER — AMLODIPINE BESYLATE 2.5 MG PO TABS: ORAL_TABLET | ORAL | 3 refills | Status: DC

## 2022-01-01 MED ORDER — TROSPIUM CHLORIDE ER 60 MG PO CP24: ORAL_CAPSULE | ORAL | 3 refills | Status: DC

## 2022-01-01 MED ORDER — SENNOSIDES-DOCUSATE SODIUM 8.6-50 MG PO TABS: ORAL_TABLET | ORAL | 3 refills | Status: DC

## 2022-01-01 MED ORDER — TELMISARTAN 20 MG PO TABS: 20.0000 mg | ORAL_TABLET | Freq: Every day | ORAL | 3 refills | Status: DC

## 2022-01-01 MED ORDER — ATORVASTATIN CALCIUM 10 MG PO TABS: 10.0000 mg | ORAL_TABLET | Freq: Every day | ORAL | 3 refills | Status: DC

## 2022-01-01 NOTE — Progress Notes (Addendum)
Chief Complaint  Patient presents with   Annual Exam   Annual 1. Htn controlled on telmisartan 20 mg qd and norvasc 2.5 mg qd hld on lipitor 10   Review of Systems  Constitutional:  Negative for weight loss.  HENT:  Negative for hearing loss.   Eyes:  Negative for blurred vision.  Respiratory:  Negative for shortness of breath.   Cardiovascular:  Negative for chest pain.  Gastrointestinal:  Negative for abdominal pain and blood in stool.  Genitourinary:  Negative for dysuria.  Musculoskeletal:  Negative for falls and joint pain.  Skin:  Negative for rash.  Neurological:  Negative for headaches.  Psychiatric/Behavioral:  Negative for depression.    Past Medical History:  Diagnosis Date   Actinic keratoses    Allergy    Arthritis    Chicken pox    Colon polyps    COVID-19    2021   Dermatitis    GERD (gastroesophageal reflux disease)    Heart murmur    youth   Hyperlipidemia    Hypertension    Urgency incontinence    Past Surgical History:  Procedure Laterality Date   BUNIONECTOMY Bilateral    CATARACT EXTRACTION W/PHACO Left 10/02/2021   Procedure: CATARACT EXTRACTION PHACO AND INTRAOCULAR LENS PLACEMENT ([place]) LEFT;  Surgeon: Leandrew Koyanagi, MD;  Location: [facility];  Service: Ophthalmology;  Laterality: Left;  3.83 00:45.7   CATARACT EXTRACTION W/PHACO Right 10/16/2021   Procedure: CATARACT EXTRACTION PHACO AND INTRAOCULAR LENS PLACEMENT ([place]) RIGHT 5.10 00:58.2;  Surgeon: Leandrew Koyanagi, MD;  Location: [facility];  Service: Ophthalmology;  Laterality: Right;   COLONOSCOPY     COLONOSCOPY WITH PROPOFOL N/A 04/18/2016   Procedure: COLONOSCOPY WITH PROPOFOL;  Surgeon: Manya Silvas, MD;  Location: [facility];  Service: Endoscopy;  Laterality: N/A;   TONSILLECTOMY AND ADENOIDECTOMY     Family History  Problem Relation Age of Onset   Arthritis Mother    Diabetes Mother    Stroke Mother    Arthritis Father    Diabetes Father     Early death Father    Heart disease Father        MI 35 y.o    Diabetes Sister    Breast cancer Neg Hx    Social History   Socioeconomic History   Marital status: Married    Spouse name: Not on file   Number of children: Not on file   Years of education: Not on file   Highest education level: Not on file  Occupational History   Not on file  Tobacco Use   Smoking status: Never   Smokeless tobacco: Never  Substance and Sexual Activity   Alcohol use: Not on file    Comment: occasionally wine    Drug use: No   Sexual activity: Never  Other Topics Concern   Not on file  Social History Narrative   Married    1 daughter age 65 as of 03/2017    Retired   Social Determinants of Radio broadcast assistant Strain: [place]  (12/19/2020)   Overall Financial Resource Strain ([place])    Difficulty of Paying Living Expenses: Not hard at all  Food Insecurity: No Food Insecurity (12/19/2020)   Hunger Vital Sign    Worried About Running Out of Food in the Last Year: Never true    Ran Out of Food in the Last Year: Never true  Transportation Needs: No Transportation Needs (12/19/2020)   PRAPARE - Transportation  Lack of Transportation (Medical): No    Lack of Transportation (Non-Medical): No  Physical Activity: Insufficiently Active (12/19/2020)   Exercise Vital Sign    Days of Exercise per Week: 4 days    Minutes of Exercise per Session: 20 min  Stress: No Stress Concern Present (12/19/2020)   [employer] - Occupational Stress Questionnaire    Feeling of Stress : Not at all  Social Connections: Unknown (12/19/2020)   Social Connection and Isolation Panel [NHANES]    Frequency of Communication with Friends and Family: Never    Frequency of Social Gatherings with Friends and Family: Never    Attends Religious Services: Never    Active Member of Clubs or Organizations: Not on file    Attends Club or Organization Meetings: Never    Marital Status: Not on  file  Intimate Partner Violence: Not At Risk (12/19/2020)   Humiliation, Afraid, Rape, and Kick questionnaire    Fear of Current or Ex-Partner: No    Emotionally Abused: No    Physically Abused: No    Sexually Abused: No   Current Meds  Medication Sig   aspirin EC 81 MG tablet Take 81 mg by mouth daily.   betamethasone, augmented, (DIPROLENE) 0.05 % gel Apply to aa's rash BID PRN only use when needed   Calcium-Magnesium-Vitamin D (CALCIUM 1200+D3 PO) Take by mouth 2 (two) times daily.   Crisaborole (EUCRISA) 2 % OINT Apply 1 application. topically in the morning and at bedtime.   docusate sodium (COLACE) 50 MG capsule Take 1 capsule (50 mg total) by mouth daily as needed for mild constipation.   fexofenadine-pseudoephedrine (ALLEGRA-D) 60-120 MG 12 hr tablet Take 1 tablet by mouth 2 (two) times daily.   Multiple Vitamin (MULTI-VITAMINS) TABS Take by mouth.   triamcinolone ointment (KENALOG) 0.1 % Apply 1 application topically 2 (two) times daily as needed.   [DISCONTINUED] amLODipine (NORVASC) 2.5 MG tablet TAKE 1 TABLET BY MOUTH IN THE MORNING AND AT BEDTIME. CAN TAKE ADDITIONAL DOSE IF BP >130/>80   [DISCONTINUED] amoxicillin-clavulanate (AUGMENTIN) 875-125 MG tablet Take 1 tablet by mouth 2 (two) times daily. With food   [DISCONTINUED] atorvastatin (LIPITOR) 10 MG tablet TAKE 1 TABLET (10 MG TOTAL) BY MOUTH DAILY AT 6 PM.   [DISCONTINUED] HYDROcodone bit-homatropine (HYCODAN) 5-1.5 MG/5ML syrup Take 5 mLs by mouth at bedtime as needed for cough.   [DISCONTINUED] SENEXON-S 8.6-50 MG tablet TAKE 1 TABLET BY MOUTH DAILY AS NEEDED FOR CONSTIPATION.   [DISCONTINUED] telmisartan (MICARDIS) 20 MG tablet TAKE 1 TABLET BY MOUTH EVERY DAY   [DISCONTINUED] Trospium Chloride 60 MG CP24 TAKE 1 CAPSULE (60 MG TOTAL) BY MOUTH DAILY IN THE MORNING. STOP OXYBUTYNIN   Allergies  Allergen Reactions   Terfenadine Rash   No results found for this or any previous visit (from the past 2160  hour(s)). Objective  Body mass index is 28.12 kg/m. Wt Readings from Last 3 Encounters:  01/01/22 169 lb (76.7 kg)  10/16/21 173 lb (78.5 kg)  10/02/21 169 lb (76.7 kg)   Temp Readings from Last 3 Encounters:  01/01/22 97.7 F (36.5 C) (Oral)  10/16/21 (!) 97 F (36.1 C)  10/02/21 (!) 97.4 F (36.3 C)   BP Readings from Last 3 Encounters:  01/01/22 120/64  10/16/21 121/73  10/02/21 130/87   Pulse Readings from Last 3 Encounters:  01/01/22 (!) 57  10/16/21 73  10/02/21 68    Physical Exam Vitals and nursing note reviewed.  Constitutional:        Appearance: Normal appearance. She is well-developed and well-groomed.  HENT:     Head: Normocephalic and atraumatic.  Eyes:     Conjunctiva/sclera: Conjunctivae normal.     Pupils: Pupils are equal, round, and reactive to light.  Cardiovascular:     Rate and Rhythm: Normal rate and regular rhythm.     Heart sounds: Normal heart sounds. No murmur heard. Pulmonary:     Effort: Pulmonary effort is normal.     Breath sounds: Normal breath sounds.  Abdominal:     General: Abdomen is flat. Bowel sounds are normal.     Tenderness: There is no abdominal tenderness.  Musculoskeletal:        General: No tenderness.  Skin:    General: Skin is warm and dry.  Neurological:     General: No focal deficit present.     Mental Status: She is alert and oriented to person, place, and time. Mental status is at baseline.     Cranial Nerves: Cranial nerves 2-12 are intact.     Motor: Motor function is intact.     Coordination: Coordination is intact.     Gait: Gait is intact.  Psychiatric:        Attention and Perception: Attention and perception normal.        Mood and Affect: Mood and affect normal.        Speech: Speech normal.        Behavior: Behavior normal. Behavior is cooperative.        Thought Content: Thought content normal.        Cognition and Memory: Cognition and memory normal.        Judgment: Judgment normal.      Assessment  Plan  Annual physical exam See below   Essential hypertension controlled - Plan: Comprehensive metabolic panel, Lipid panel, CBC with Differential/Platelet, amLODipine (NORVASC) 2.5 MG tablet, telmisartan (MICARDIS) 20 MG tablet  Hyperlipidemia, unspecified hyperlipidemia type - Plan: atorvastatin (LIPITOR) 10 MG tablet  Prediabetes - Plan: Hemoglobin A1c   Overactive bladder - Plan: Trospium Chloride 60 MG CP24    CKD3a with AKI with h/o htn and prediabetes A1c 6.1 pt avoids nsaids Refer to renal Dr. Candiss Norse further w/u and consult  Spep neg 02/16/19    HM Flu shot will get later Tdap 05/29/17  shingrix 2/2  covid 4/4 prevnar utd  Prevnar 66 today   Never smoker  immune MMR   Colonoscopy 04/18/16 IH, diverticulosis f/u in 5 years [place]  Utd 2023 f/u in 7 years    Pap had 04/03/17 f/u in 3 years check w/ insurance if covered   mammo 06/25/21 negative ordered 2024   DEXA age 80 ordered normal 06/11/20 normal  F/u Dr. Melvern Sample due 08/2022   rec healthy diet and exercise  Call back if wants right knee Xray resolved for now 12/2021     Provider: Dr. Olivia Mackie McLean-Scocuz[facility]

## 2022-01-01 NOTE — Patient Instructions (Addendum)
Consider flu shot high dose  Dr. Volanda Napoleon, Dr. Nicki Reaper, Derrel Nip   Pneumococcal Conjugate Vaccine (Prevnar 20) Suspension for Injection What is this medication? PNEUMOCOCCAL VACCINE (NEU mo KOK al vak SEEN) is a vaccine. It prevents pneumococcus bacterial infections. These bacteria can cause serious infections like pneumonia, meningitis, and blood infections. This vaccine will not treat an infection and will not cause infection. This vaccine is recommended for adults 18 years and older. This medicine may be used for other purposes; ask your health care provider or pharmacist if you have questions. COMMON BRAND NAME(S): Prevnar 20 What should I tell my care team before I take this medication? They need to know if you have any of these conditions: bleeding disorder fever immune system problems an unusual or allergic reaction to pneumococcal vaccine, diphtheria toxoid, other vaccines, other medicines, foods, dyes, or preservatives pregnant or trying to get pregnant breast-feeding How should I use this medication? This vaccine is injected into a muscle. It is given by a health care provider. A copy of Vaccine Information Statements will be given before each vaccination. Be sure to read this information carefully each time. This sheet may change often. Talk to your health care provider about the use of this medicine in children. Special care may be needed. Overdosage: If you think you have taken too much of this medicine contact a poison control center or emergency room at once. NOTE: This medicine is only for you. Do not share this medicine with others. What if I miss a dose? This does not apply. This medicine is not for regular use. What may interact with this medication? medicines for cancer chemotherapy medicines that suppress your immune function steroid medicines like prednisone or cortisone This list may not describe all possible interactions. Give your health care provider a list of all the  medicines, herbs, non-prescription drugs, or dietary supplements you use. Also tell them if you smoke, drink alcohol, or use illegal drugs. Some items may interact with your medicine. What should I watch for while using this medication? Mild fever and pain should go away in 3 days or less. Report any unusual symptoms to your health care provider. What side effects may I notice from receiving this medication? Side effects that you should report to your doctor or health care professional as soon as possible: allergic reactions (skin rash, itching or hives; swelling of the face, lips, or tongue) confusion fast, irregular heartbeat fever over 102 degrees F muscle weakness seizures trouble breathing unusual bruising or bleeding Side effects that usually do not require medical attention (report to your doctor or health care professional if they continue or are bothersome): fever of 102 degrees F or less headache joint pain muscle cramps, pain pain, tender at site where injected This list may not describe all possible side effects. Call your doctor for medical advice about side effects. You may report side effects to FDA at 1-800-FDA-1088. Where should I keep my medication? This vaccine is only given by a health care provider. It will not be stored at home. NOTE: This sheet is a summary. It may not cover all possible information. If you have questions about this medicine, talk to your doctor, pharmacist, or health care provider.  2023 Elsevier/Gold Standard (2019-12-23 00:00:00)

## 2022-01-01 NOTE — Addendum Note (Signed)
Addended by: Gracy Racer on: 01/01/2022 02:15 PM   Modules accepted: Orders

## 2022-01-03 ENCOUNTER — Telehealth: Payer: Self-pay | Admitting: Internal Medicine

## 2022-01-03 NOTE — Telephone Encounter (Signed)
Copied from [employer] [id number]. Topic: Medicare AWV >> Jan 03, 2022 10:36 AM Devoria Glassing wrote: Reason for CRM: Left message for patient to schedule Annual Wellness Visit.  Please schedule with Nurse Health Advisor Denisa O'Brien-Blaney, LPN at [facility]. This appt can be telephone or office visit.  Please call [phone] ask for [facility]

## 2022-01-08 ENCOUNTER — Ambulatory Visit (INDEPENDENT_AMBULATORY_CARE_PROVIDER_SITE_OTHER): Payer: Medicare HMO

## 2022-01-08 VITALS — Ht 65.0 in | Wt 169.0 lb

## 2022-01-08 DIAGNOSIS — Z0000 Encounter for general adult medical examination without abnormal findings: Secondary | ICD-10-CM

## 2022-01-08 NOTE — Patient Instructions (Addendum)
[name] , Thank you for taking time to come for your Medicare Wellness Visit. I appreciate your ongoing commitment to your health goals. Please review the following plan we discussed and let me know if I can assist you in the future.   These are the goals we discussed:  Goals      I would like to lose a little weight     Low carb diet Weight goal 158lb        This is a list of the screening recommended for you and due dates:  Health Maintenance  Topic Date Due   COVID-19 Vaccine (5 - Pfizer risk series) 04/23/2021   Flu Shot  08/03/2022*   Mammogram  06/26/2023   Colon Cancer Screening  04/18/2026   Tetanus Vaccine  05/30/2027   Pneumonia Vaccine  Completed   DEXA scan (bone density measurement)  Completed   Hepatitis C Screening: USPSTF Recommendation to screen - Ages 58-79 yo.  Completed   Zoster (Shingles) Vaccine  Completed   HPV Vaccine  Aged Out  *Topic was postponed. The date shown is not the original due date.    Preventive Care 47 Years and Older, Female Preventive care refers to lifestyle choices and visits with your health care provider that can promote health and wellness. What does preventive care include? A yearly physical exam. This is also called an annual well check. Dental exams once or twice a year. Routine eye exams. Ask your health care provider how often you should have your eyes checked. Personal lifestyle choices, including: Daily care of your teeth and gums. Regular physical activity. Eating a healthy diet. Avoiding tobacco and drug use. Limiting alcohol use. Practicing safe sex. Taking low-dose aspirin every day. Taking vitamin and mineral supplements as recommended by your health care provider. What happens during an annual well check? The services and screenings done by your health care provider during your annual well check will depend on your age, overall health, lifestyle risk factors, and family history of disease. Counseling  Your  health care provider may ask you questions about your: Alcohol use. Tobacco use. Drug use. Emotional well-being. Home and relationship well-being. Sexual activity. Eating habits. History of falls. Memory and ability to understand (cognition). Work and work Statistician. Reproductive health. Screening  You may have the following tests or measurements: Height, weight, and BMI. Blood pressure. Lipid and cholesterol levels. These may be checked every 5 years, or more frequently if you are over 10 years old. Skin check. Lung cancer screening. You may have this screening every year starting at age 63 if you have a 30-pack-year history of smoking and currently smoke or have quit within the past 15 years. Fecal occult blood test (FOBT) of the stool. You may have this test every year starting at age 17. Flexible sigmoidoscopy or colonoscopy. You may have a sigmoidoscopy every 5 years or a colonoscopy every 10 years starting at age 36. Hepatitis C blood test. Hepatitis B blood test. Sexually transmitted disease (STD) testing. Diabetes screening. This is done by checking your blood sugar (glucose) after you have not eaten for a while (fasting). You may have this done every 1-3 years. Bone density scan. This is done to screen for osteoporosis. You may have this done starting at age 9. Mammogram. This may be done every 1-2 years. Talk to your health care provider about how often you should have regular mammograms. Talk with your health care provider about your test results, treatment options, and if necessary,  the need for more tests. Vaccines  Your health care provider may recommend certain vaccines, such as: Influenza vaccine. This is recommended every year. Tetanus, diphtheria, and acellular pertussis (Tdap, Td) vaccine. You may need a Td booster every 10 years. Zoster vaccine. You may need this after age 12. Pneumococcal 13-valent conjugate (PCV13) vaccine. One dose is recommended after age  59. Pneumococcal polysaccharide (PPSV23) vaccine. One dose is recommended after age 73. Talk to your health care provider about which screenings and vaccines you need and how often you need them. This information is not intended to replace advice given to you by your health care provider. Make sure you discuss any questions you have with your health care provider. Document Released: 05/18/2015 Document Revised: 01/09/2016 Document Reviewed: 02/20/2015 Elsevier Interactive Patient Education  2017 [place] Prevention in the Home Falls can cause injuries. They can happen to people of all ages. There are many things you can do to make your home safe and to help prevent falls. What can I do on the outside of my home? Regularly fix the edges of walkways and driveways and fix any cracks. Remove anything that might make you trip as you walk through a door, such as a raised step or threshold. Trim any bushes or trees on the path to your home. Use bright outdoor lighting. Clear any walking paths of anything that might make someone trip, such as rocks or tools. Regularly check to see if handrails are loose or broken. Make sure that both sides of any steps have handrails. Any raised decks and porches should have guardrails on the edges. Have any leaves, snow, or ice cleared regularly. Use sand or salt on walking paths during winter. Clean up any spills in your garage right away. This includes oil or grease spills. What can I do in the bathroom? Use night lights. Install grab bars by the toilet and in the tub and shower. Do not use towel bars as grab bars. Use non-skid mats or decals in the tub or shower. If you need to sit down in the shower, use a plastic, non-slip stool. Keep the floor dry. Clean up any water that spills on the floor as soon as it happens. Remove soap buildup in the tub or shower regularly. Attach bath mats securely with double-sided non-slip rug tape. Do not have throw  rugs and other things on the floor that can make you trip. What can I do in the bedroom? Use night lights. Make sure that you have a light by your bed that is easy to reach. Do not use any sheets or blankets that are too big for your bed. They should not hang down onto the floor. Have a firm chair that has side arms. You can use this for support while you get dressed. Do not have throw rugs and other things on the floor that can make you trip. What can I do in the kitchen? Clean up any spills right away. Avoid walking on wet floors. Keep items that you use a lot in easy-to-reach places. If you need to reach something above you, use a strong step stool that has a grab bar. Keep electrical cords out of the way. Do not use floor polish or wax that makes floors slippery. If you must use wax, use non-skid floor wax. Do not have throw rugs and other things on the floor that can make you trip. What can I do with my stairs? Do not leave any items on  the stairs. Make sure that there are handrails on both sides of the stairs and use them. Fix handrails that are broken or loose. Make sure that handrails are as long as the stairways. Check any carpeting to make sure that it is firmly attached to the stairs. Fix any carpet that is loose or worn. Avoid having throw rugs at the top or bottom of the stairs. If you do have throw rugs, attach them to the floor with carpet tape. Make sure that you have a light switch at the top of the stairs and the bottom of the stairs. If you do not have them, ask someone to add them for you. What else can I do to help prevent falls? Wear shoes that: Do not have high heels. Have rubber bottoms. Are comfortable and fit you well. Are closed at the toe. Do not wear sandals. If you use a stepladder: Make sure that it is fully opened. Do not climb a closed stepladder. Make sure that both sides of the stepladder are locked into place. Ask someone to hold it for you, if  possible. Clearly mark and make sure that you can see: Any grab bars or handrails. First and last steps. Where the edge of each step is. Use tools that help you move around (mobility aids) if they are needed. These include: Canes. Walkers. Scooters. Crutches. Turn on the lights when you go into a dark area. Replace any light bulbs as soon as they burn out. Set up your furniture so you have a clear path. Avoid moving your furniture around. If any of your floors are uneven, fix them. If there are any pets around you, be aware of where they are. Review your medicines with your doctor. Some medicines can make you feel dizzy. This can increase your chance of falling. Ask your doctor what other things that you can do to help prevent falls. This information is not intended to replace advice given to you by your health care provider. Make sure you discuss any questions you have with your health care provider. Document Released: 02/15/2009 Document Revised: 09/27/2015 Document Reviewed: 05/26/2014 Elsevier Interactive Patient Education  2017 [employer].

## 2022-01-08 NOTE — Progress Notes (Signed)
Subjective:   Melissa [name] is a 66 y.o. female who presents for Medicare Annual (Subsequent) preventive examination.  Review of Systems    No ROS.  Medicare Wellness Virtual Visit.  Visual/audio telehealth visit, UTA vital signs.   See social history for additional risk factors.   Cardiac Risk Factors include: advanced age (>19mn, >>75women)     Objective:    There were no vitals filed for this visit. There is no height or weight on file to calculate BMI.     01/08/2022    8:42 AM 10/02/2021   10:25 AM 12/19/2020    1:28 PM 04/18/2016    1:23 PM  Advanced Directives  Does Patient Have a Medical Advance Directive? No No No No  Would patient like information on creating a medical advance directive? No - Patient declined No - Patient declined No - Patient declined     Current Medications (verified) Outpatient Encounter Medications as of 01/08/2022  Medication Sig   amLODipine (NORVASC) 2.5 MG tablet TAKE 1 TABLET BY MOUTH IN THE MORNING AND AT BEDTIME. CAN TAKE ADDITIONAL DOSE IF BP >130/>80   aspirin EC 81 MG tablet Take 81 mg by mouth daily.   atorvastatin (LIPITOR) 10 MG tablet Take 1 tablet (10 mg total) by mouth daily at 6 PM.   betamethasone, augmented, (DIPROLENE) 0.05 % gel Apply to aa's rash BID PRN only use when needed   Calcium-Magnesium-Vitamin D (CALCIUM 1200+D3 PO) Take by mouth 2 (two) times daily.   Crisaborole (EUCRISA) 2 % OINT Apply 1 application. topically in the morning and at bedtime.   docusate sodium (COLACE) 50 MG capsule Take 1 capsule (50 mg total) by mouth daily as needed for mild constipation.   fexofenadine-pseudoephedrine (ALLEGRA-D) 60-120 MG 12 hr tablet Take 1 tablet by mouth 2 (two) times daily.   Multiple Vitamin (MULTI-VITAMINS) TABS Take by mouth.   senna-docusate (SENEXON-S) 8.6-50 MG tablet TAKE 1 TABLET BY MOUTH DAILY AS NEEDED FOR CONSTIPATION.   telmisartan (MICARDIS) 20 MG tablet Take 1 tablet (20 mg total) by mouth daily.    triamcinolone ointment (KENALOG) 0.1 % Apply 1 application topically 2 (two) times daily as needed.   Trospium Chloride 60 MG CP24 TAKE 1 CAPSULE (60 MG TOTAL) BY MOUTH DAILY IN THE MORNING. STOP OXYBUTYNIN   No facility-administered encounter medications on file as of 01/08/2022.    Allergies (verified) Terfenadine   History: Past Medical History:  Diagnosis Date   Actinic keratoses    Allergy    Arthritis    Chicken pox    Colon polyps    COVID-19    2021   Dermatitis    GERD (gastroesophageal reflux disease)    Heart murmur    youth   Hyperlipidemia    Hypertension    Urgency incontinence    Past Surgical History:  Procedure Laterality Date   BUNIONECTOMY Bilateral    CATARACT EXTRACTION W/PHACO Left 10/02/2021   Procedure: CATARACT EXTRACTION PHACO AND INTRAOCULAR LENS PLACEMENT ([place] LEFT;  Surgeon: BLeandrew Koyanagi MD;  [place]  Service: Ophthalmology;  Laterality: Left;  3.83 00:45.7   CATARACT EXTRACTION W/PHACO Right 10/16/2021   Procedure: CATARACT EXTRACTION PHACO AND INTRAOCULAR LENS PLACEMENT ([place] RIGHT 5.10 00:58.2;  Surgeon: BLeandrew Koyanagi MD;  [place]  Service: Ophthalmology;  Laterality: Right;   COLONOSCOPY     COLONOSCOPY WITH PROPOFOL N/A 04/18/2016   Procedure: COLONOSCOPY WITH PROPOFOL;  Surgeon: RManya Silvas MD;  [facility];  Service: Endoscopy;  Laterality: N/A;   TONSILLECTOMY AND ADENOIDECTOMY     Family History  Problem Relation Age of Onset   Arthritis Mother    Diabetes Mother    Stroke Mother    Arthritis Father    Diabetes Father    Early death Father    Heart disease Father        MI 67 y.o    Diabetes Sister    Breast cancer Neg Hx    Social History   Socioeconomic History   Marital status: Married    Spouse name: Not on file   Number of children: Not on file   Years of education: Not on file   Highest education level: Not on file  Occupational  History   Not on file  Tobacco Use   Smoking status: Never   Smokeless tobacco: Never  Substance and Sexual Activity   Alcohol use: Not on file    Comment: occasionally wine    Drug use: No   Sexual activity: Never  Other Topics Concern   Not on file  Social History Narrative   Married    1 daughter age 31 as of 03/2017    Retired   Social Determinants of Radio broadcast assistant Strain: [place]  (01/08/2022)   Overall Financial Resource Strain (CARDIA)    Difficulty of Paying Living Expenses: Not hard at all  Food Insecurity: No Food Insecurity (01/08/2022)   Hunger Vital Sign    Worried About Running Out of Food in the Last Year: Never true    Ran Out of Food in the Last Year: Never true  Transportation Needs: No Transportation Needs (01/08/2022)   PRAPARE - Hydrologist (Medical): No    Lack of Transportation (Non-Medical): No  Physical Activity: Insufficiently Active (12/19/2020)   Exercise Vital Sign    Days of Exercise per Week: 4 days    Minutes of Exercise per Session: 20 min  Stress: No Stress Concern Present (01/08/2022)   [place]    Feeling of Stress : Not at all  Social Connections: Unknown (01/08/2022)   Social Connection and Isolation Panel [NHANES]    Frequency of Communication with Friends and Family: Not on file    Frequency of Social Gatherings with Friends and Family: Not on file    Attends Religious Services: Not on file    Active Member of Clubs or Organizations: Not on file    Attends Archivist Meetings: Not on file    Marital Status: Married    Tobacco Counseling Counseling given: Not Answered   Clinical Intake:                        Activities of Daily Living    01/08/2022    8:38 AM 10/02/2021   10:24 AM  In your present state of health, do you have any difficulty performing the following activities:  Hearing? 0 0   Vision? 0 0  Difficulty concentrating or making decisions? 0 0  Walking or climbing stairs? 0 0  Dressing or bathing? 0 0  Doing errands, shopping? 0   Preparing Food and eating ? N   Using the Toilet? N   In the past six months, have you accidently leaked urine? N   Comment Managed by medication. Followed by PCP.   Do you have problems with loss of bowel control?  N   Managing your Medications? N   Managing your Finances? N   Housekeeping or managing your Housekeeping? N     Patient Care Team: McLean-Scocuzza, Nino Glow, MD as PCP - General (Internal Medicine)  Indicate any recent Medical Services you may have received from other than [facility] providers in the past year (date may be approximate).     Assessment:   This is a routine wellness examination for Melissa [name].  Virtual Visit via Telephone Note  I connected with  Melissa [name] on 01/08/22 at  8:30 AM EDT by telephone and verified that I am speaking with the correct person using two identifiers.  Location: Patient: home Provider: office Persons participating in the virtual visit: patient/Nurse Health Advisor   I discussed the limitations of performing an evaluation and management service by telehealth. We continued and completed visit with audio only. Some vital signs may be absent or patient reported.   Hearing/Vision screen Hearing Screening - Comments:: Patient is able to hear conversational tones without difficulty. No issues reported. Vision Screening - Comments:: Followed by [facility] Wears corrective lenses when reading Cataract extraction, bilateral They have seen their ophthalmologist in the last 12 months.   Dietary issues and exercise activities discussed: Current Exercise Habits: Home exercise routine, Type of exercise: calisthenics (aerobic class), Time (Minutes): 60, Frequency (Times/Week): 3, Weekly Exercise (Minutes/Week): 180, Intensity: Mild Low carb diet Reduce sugar intake Good water  intake    Goals Addressed             This Visit's Progress    I would like to lose a little weight       Low carb diet Weight goal 158lb       Depression Screen    01/08/2022    8:38 AM 01/01/2022    1:22 PM 12/19/2020    1:25 PM 09/12/2020    9:31 AM 02/03/2020   12:21 PM 10/13/2019    8:38 AM 03/17/2018    9:58 AM  PHQ 2/9 Scores  PHQ - 2 Score 0 0 0 0 0 0 0  PHQ- 9 Score    0       Fall Risk    01/08/2022    8:38 AM 01/01/2022    1:22 PM 12/19/2020    1:28 PM 09/12/2020    9:31 AM 02/03/2020   12:21 PM  [place] in the past year? 0 0 0 0 0  Number falls in past yr: 0 0  0 0  Injury with Fall? 0 0 0 0 0  Risk for fall due to :  No Fall Risks     Follow up Falls evaluation completed Falls evaluation completed Falls evaluation completed Falls evaluation completed Falls evaluation completed    [place]: Home free of loose throw rugs in walkways, pet beds, electrical cords, etc? Yes  Adequate lighting in your home to reduce risk of falls? Yes   ASSISTIVE DEVICES UTILIZED TO PREVENT FALLS: Life alert? No  Use of a cane, walker or w/c? No  Grab bars in the bathroom? No  Shower chair or bench in shower? No  Elevated toilet seat or a handicapped toilet? No   TIMED UP AND GO: Was the test performed? No .    Cognitive Function:        01/08/2022    8:45 AM  6CIT Screen  What Year? 0 points  What month? 0 points  What time?  0 points  Count back from 20 0 points  Months in reverse 0 points  Repeat phrase 0 points  Total Score 0 points    Immunizations Immunization History  Administered Date(s) Administered   Influenza, High Dose Seasonal PF 02/10/2020, 03/13/2021   Influenza,inj,Quad PF,6+ Mos 02/12/2018, 02/16/2019   Influenza-Unspecified 03/19/2017, 02/16/2019   PFIZER(Purple Top)SARS-COV-2 Vaccination 05/11/2019, 06/01/2019, 02/10/2020   PNEUMOCOCCAL CONJUGATE-20 01/01/2022   Pfizer Covid-19 Vaccine Bivalent  Booster 34yr & up 02/26/2021   Pneumococcal Conjugate-13 10/13/2019   Tdap 05/29/2017   Zoster Recombinat (Shingrix) 03/17/2018, 07/23/2018   Screening Tests Health Maintenance  Topic Date Due   COVID-19 Vaccine (5 - Pfizer risk series) 04/23/2021   INFLUENZA VACCINE  08/03/2022 (Originally 12/03/2021)   MAMMOGRAM  06/26/2023   COLONOSCOPY (Pts 45-41yrInsurance coverage will need to be confirmed)  04/18/2026   TETANUS/TDAP  05/30/2027   Pneumonia Vaccine 6519Years old  Completed   DEXA SCAN  Completed   Hepatitis C Screening  Completed   Zoster Vaccines- Shingrix  Completed   HPV VACCINES  Aged Out   Health Maintenance Health Maintenance Due  Topic Date Due   COVID-19 Vaccine (5 - Pfizer risk series) 04/23/2021   Lung Cancer Screening: (Low Dose CT Chest recommended if Age 67-80ears, 30 pack-year currently smoking OR have quit w/in 15years.) does not qualify.   Vision Screening: Recommended annual ophthalmology exams for early detection of glaucoma and other disorders of the eye.  Dental Screening: Recommended annual dental exams for proper oral hygiene  Community Resource Referral / Chronic Care Management: CRR required this visit?  No   CCM required this visit?  No      Plan:     I have personally reviewed and noted the following in the patient's chart:   Medical and social history Use of alcohol, tobacco or illicit drugs  Current medications and supplements including opioid prescriptions. Patient is not currently taking opioid prescriptions. Functional ability and status Nutritional status Physical activity Advanced directives List of other physicians Hospitalizations, surgeries, and ER visits in previous 12 months Vitals Screenings to include cognitive, depression, and falls Referrals and appointments  In addition, I have reviewed and discussed with patient certain preventive protocols, quality metrics, and best practice recommendations. A written  personalized care plan for preventive services as well as general preventive health recommendations were provided to patient.     OBVarney BilesLPN   9/08/04/6382

## 2022-01-09 ENCOUNTER — Other Ambulatory Visit (INDEPENDENT_AMBULATORY_CARE_PROVIDER_SITE_OTHER): Payer: Medicare HMO

## 2022-01-09 DIAGNOSIS — I10 Essential (primary) hypertension: Secondary | ICD-10-CM | POA: Diagnosis not present

## 2022-01-09 DIAGNOSIS — R7303 Prediabetes: Secondary | ICD-10-CM

## 2022-01-09 DIAGNOSIS — N3000 Acute cystitis without hematuria: Secondary | ICD-10-CM | POA: Diagnosis not present

## 2022-01-09 DIAGNOSIS — Z1329 Encounter for screening for other suspected endocrine disorder: Secondary | ICD-10-CM | POA: Diagnosis not present

## 2022-01-09 DIAGNOSIS — R5383 Other fatigue: Secondary | ICD-10-CM | POA: Diagnosis not present

## 2022-01-09 DIAGNOSIS — Z1389 Encounter for screening for other disorder: Secondary | ICD-10-CM

## 2022-01-09 LAB — LIPID PANEL
Cholesterol: 170 mg/dL (ref 0–200)
HDL: 82.9 mg/dL (ref >39.00)
LDL Cholesterol: 79 mg/dL (ref 0–99)
NonHDL: 87.55
Total CHOL/HDL Ratio: 2
Triglycerides: 43 mg/dL (ref 0.0–149.0)
VLDL: 8.6 mg/dL (ref 0.0–40.0)

## 2022-01-09 LAB — CBC WITH DIFFERENTIAL/PLATELET
Basophils Absolute: 0 10*3/uL (ref 0.0–0.1)
Basophils Relative: 0.5 % (ref 0.0–3.0)
Eosinophils Absolute: 0.1 10*3/uL (ref 0.0–0.7)
Eosinophils Relative: 1.4 % (ref 0.0–5.0)
HCT: 33 % — ABNORMAL LOW (ref 36.0–46.0)
Hemoglobin: 11.1 g/dL — ABNORMAL LOW (ref 12.0–15.0)
Lymphocytes Relative: 25.8 % (ref 12.0–46.0)
Lymphs Abs: 1.6 10*3/uL (ref 0.7–4.0)
MCHC: 33.6 g/dL (ref 30.0–36.0)
MCV: 93.3 fl (ref 78.0–100.0)
Monocytes Absolute: 0.7 10*3/uL (ref 0.1–1.0)
Monocytes Relative: 11.2 % (ref 3.0–12.0)
Neutro Abs: 3.9 10*3/uL (ref 1.4–7.7)
Neutrophils Relative %: 61.1 % (ref 43.0–77.0)
Platelets: 177 10*3/uL (ref 150.0–400.0)
RBC: 3.54 Mil/uL — ABNORMAL LOW (ref 3.87–5.11)
RDW: 13.9 % (ref 11.5–15.5)
WBC: 6.4 10*3/uL (ref 4.0–10.5)

## 2022-01-09 LAB — COMPREHENSIVE METABOLIC PANEL
ALT: 20 U/L (ref 0–35)
AST: 23 U/L (ref 0–37)
Albumin: 4 g/dL (ref 3.5–5.2)
Alkaline Phosphatase: 17 U/L — ABNORMAL LOW (ref 39–117)
BUN: 36 mg/dL — ABNORMAL HIGH (ref 6–23)
CO2: 26 mEq/L (ref 19–32)
Calcium: 9.7 mg/dL (ref 8.4–10.5)
Chloride: 105 mEq/L (ref 96–112)
Creatinine, Ser: 1.38 mg/dL — ABNORMAL HIGH (ref 0.40–1.20)
GFR: 39.6 mL/min — ABNORMAL LOW (ref >60.00)
Glucose, Bld: 89 mg/dL (ref 70–99)
Potassium: 4.4 mEq/L (ref 3.5–5.1)
Sodium: 141 mEq/L (ref 135–145)
Total Bilirubin: 0.3 mg/dL (ref 0.2–1.2)
Total Protein: 6.7 g/dL (ref 6.0–8.3)

## 2022-01-09 LAB — HEMOGLOBIN A1C: Hgb A1c MFr Bld: 6.1 % (ref 4.6–6.5)

## 2022-01-09 LAB — TSH: TSH: 1.77 u[IU]/mL (ref 0.35–5.50)

## 2022-01-10 LAB — URINALYSIS, ROUTINE W REFLEX MICROSCOPIC
Bilirubin Urine: NEGATIVE
Glucose, UA: NEGATIVE
Hgb urine dipstick: NEGATIVE
Ketones, ur: NEGATIVE
Leukocytes,Ua: NEGATIVE
Nitrite: NEGATIVE
Protein, ur: NEGATIVE
Specific Gravity, Urine: 1.014 (ref 1.001–1.035)
pH: 7 (ref 5.0–8.0)

## 2022-01-13 DIAGNOSIS — N1831 Chronic kidney disease, stage 3a: Secondary | ICD-10-CM | POA: Insufficient documentation

## 2022-01-13 DIAGNOSIS — N1832 Chronic kidney disease, stage 3b: Secondary | ICD-10-CM | POA: Insufficient documentation

## 2022-01-13 NOTE — Addendum Note (Signed)
Addended by: Orland Mustard on: 01/13/2022 09:20 PM   Modules accepted: Orders

## 2022-01-16 ENCOUNTER — Telehealth: Payer: Self-pay | Admitting: Internal Medicine

## 2022-01-16 NOTE — Telephone Encounter (Signed)
Pt called stating the kidney specialist need information like blood work before they can make an appointment for pt Dr Candiss Norse kidney specialist

## 2022-01-17 NOTE — Telephone Encounter (Signed)
Labs were resent

## 2022-02-10 DIAGNOSIS — M722 Plantar fascial fibromatosis: Secondary | ICD-10-CM | POA: Diagnosis not present

## 2022-02-10 DIAGNOSIS — M7752 Other enthesopathy of left foot: Secondary | ICD-10-CM | POA: Diagnosis not present

## 2022-04-21 DIAGNOSIS — I129 Hypertensive chronic kidney disease with stage 1 through stage 4 chronic kidney disease, or unspecified chronic kidney disease: Secondary | ICD-10-CM | POA: Diagnosis not present

## 2022-04-21 DIAGNOSIS — I10 Essential (primary) hypertension: Secondary | ICD-10-CM | POA: Diagnosis not present

## 2022-04-21 DIAGNOSIS — N1832 Chronic kidney disease, stage 3b: Secondary | ICD-10-CM | POA: Diagnosis not present

## 2022-04-22 ENCOUNTER — Other Ambulatory Visit: Payer: Self-pay | Admitting: Nephrology

## 2022-04-22 DIAGNOSIS — N1832 Chronic kidney disease, stage 3b: Secondary | ICD-10-CM

## 2022-04-22 DIAGNOSIS — I129 Hypertensive chronic kidney disease with stage 1 through stage 4 chronic kidney disease, or unspecified chronic kidney disease: Secondary | ICD-10-CM

## 2022-04-23 DIAGNOSIS — M7752 Other enthesopathy of left foot: Secondary | ICD-10-CM | POA: Diagnosis not present

## 2022-04-23 DIAGNOSIS — M722 Plantar fascial fibromatosis: Secondary | ICD-10-CM | POA: Diagnosis not present

## 2022-04-30 ENCOUNTER — Ambulatory Visit
Admission: RE | Admit: 2022-04-30 | Discharge: 2022-04-30 | Disposition: A | Discharge status: Home / Self Care | Payer: Medicare HMO | Source: Ambulatory Visit | Attending: Nephrology | Admitting: Nephrology

## 2022-04-30 DIAGNOSIS — N281 Cyst of kidney, acquired: Secondary | ICD-10-CM | POA: Diagnosis not present

## 2022-04-30 DIAGNOSIS — N181 Chronic kidney disease, stage 1: Secondary | ICD-10-CM | POA: Diagnosis not present

## 2022-04-30 DIAGNOSIS — N261 Atrophy of kidney (terminal): Secondary | ICD-10-CM | POA: Diagnosis not present

## 2022-04-30 DIAGNOSIS — I129 Hypertensive chronic kidney disease with stage 1 through stage 4 chronic kidney disease, or unspecified chronic kidney disease: Secondary | ICD-10-CM | POA: Diagnosis not present

## 2022-04-30 DIAGNOSIS — N1832 Chronic kidney disease, stage 3b: Secondary | ICD-10-CM | POA: Diagnosis not present

## 2022-05-10 DIAGNOSIS — E785 Hyperlipidemia, unspecified: Secondary | ICD-10-CM | POA: Diagnosis not present

## 2022-05-10 DIAGNOSIS — Z833 Family history of diabetes mellitus: Secondary | ICD-10-CM | POA: Diagnosis not present

## 2022-05-10 DIAGNOSIS — K219 Gastro-esophageal reflux disease without esophagitis: Secondary | ICD-10-CM | POA: Diagnosis not present

## 2022-05-10 DIAGNOSIS — Z8249 Family history of ischemic heart disease and other diseases of the circulatory system: Secondary | ICD-10-CM | POA: Diagnosis not present

## 2022-05-10 DIAGNOSIS — Z008 Encounter for other general examination: Secondary | ICD-10-CM | POA: Diagnosis not present

## 2022-05-10 DIAGNOSIS — Z85828 Personal history of other malignant neoplasm of skin: Secondary | ICD-10-CM | POA: Diagnosis not present

## 2022-05-10 DIAGNOSIS — M1990 Unspecified osteoarthritis, unspecified site: Secondary | ICD-10-CM | POA: Diagnosis not present

## 2022-05-10 DIAGNOSIS — K5900 Constipation, unspecified: Secondary | ICD-10-CM | POA: Diagnosis not present

## 2022-05-10 DIAGNOSIS — I10 Essential (primary) hypertension: Secondary | ICD-10-CM | POA: Diagnosis not present

## 2022-05-10 DIAGNOSIS — R32 Unspecified urinary incontinence: Secondary | ICD-10-CM | POA: Diagnosis not present

## 2022-05-15 DIAGNOSIS — H43813 Vitreous degeneration, bilateral: Secondary | ICD-10-CM | POA: Diagnosis not present

## 2022-05-15 DIAGNOSIS — H04123 Dry eye syndrome of bilateral lacrimal glands: Secondary | ICD-10-CM | POA: Diagnosis not present

## 2022-05-15 DIAGNOSIS — Z961 Presence of intraocular lens: Secondary | ICD-10-CM | POA: Diagnosis not present

## 2022-05-27 ENCOUNTER — Telehealth: Payer: Self-pay | Admitting: Internal Medicine

## 2022-05-27 NOTE — Telephone Encounter (Signed)
Pt called wanting a change from the Trospium Chloride to oxybbutynin extended release '15mg'$  90 day ref[facility] [place] on [address]

## 2022-05-29 ENCOUNTER — Ambulatory Visit (INDEPENDENT_AMBULATORY_CARE_PROVIDER_SITE_OTHER): Payer: Medicare HMO | Admitting: Nurse Practitioner

## 2022-05-29 ENCOUNTER — Encounter: Payer: Self-pay | Admitting: Nurse Practitioner

## 2022-05-29 VITALS — BP 122/80 | HR 67 | Temp 98.0°F | Ht 65.0 in | Wt 168.8 lb

## 2022-05-29 DIAGNOSIS — E785 Hyperlipidemia, unspecified: Secondary | ICD-10-CM

## 2022-05-29 DIAGNOSIS — N3281 Overactive bladder: Secondary | ICD-10-CM

## 2022-05-29 DIAGNOSIS — I10 Essential (primary) hypertension: Secondary | ICD-10-CM

## 2022-05-29 MED ORDER — OXYBUTYNIN CHLORIDE ER 10 MG PO TB24: 10.0000 mg | ORAL_TABLET | Freq: Every day | ORAL | 3 refills | Status: DC

## 2022-05-29 NOTE — Progress Notes (Signed)
Melissa Morrow, NP-C Phone: [phone]  BRONTE [name] is a 67 y.o. female who presents today to discuss medication for her overactive bladder.   She was previously on Oxybutynin which she did not feel provided relief of her symptoms. She was then switched to Trospium ER daily. She reports this medication has been working well but is very expensive. She is paying approximately $100/month for her prescription. She would like to go back to the Oxybutynin as it is covered by her insurance for much cheaper.   HYPERTENSION Disease Monitoring Home BP Monitoring- Not checking Chest pain- No    Dyspnea- No Medications Compliance-  Norvasc and Telmisartan. Lightheadedness-  No  Edema- No BMET    Component Value Date/Time   NA 141 01/09/2022 0749   K 4.4 01/09/2022 0749   CL 105 01/09/2022 0749   CO2 26 01/09/2022 0749   GLUCOSE 89 01/09/2022 0749   BUN 36 (H) 01/09/2022 0749   CREATININE 1.38 (H) 01/09/2022 0749   CREATININE 1.15 (H) 03/02/2018 0826   CALCIUM 9.7 01/09/2022 0749   HYPERLIPIDEMIA Symptoms Chest pain on exertion:  No   Leg claudication:   No Medications: Compliance- Lipitor Right upper quadrant pain- No  Muscle aches- No Lipid Panel     Component Value Date/Time   CHOL 170 01/09/2022 0749   TRIG 43.0 01/09/2022 0749   HDL 82.90 01/09/2022 0749   CHOLHDL 2 01/09/2022 0749   VLDL 8.6 01/09/2022 0749   LDLCALC 79 01/09/2022 0749     Social History   Tobacco Use  Smoking Status Never  Smokeless Tobacco Never    Current Outpatient Medications on File Prior to Visit  Medication Sig Dispense Refill   amLODipine (NORVASC) 2.5 MG tablet TAKE 1 TABLET BY MOUTH IN THE MORNING AND AT BEDTIME. CAN TAKE ADDITIONAL DOSE IF BP >130/>80 180 tablet 3   aspirin EC 81 MG tablet Take 81 mg by mouth daily.     atorvastatin (LIPITOR) 10 MG tablet Take 1 tablet (10 mg total) by mouth daily at 6 PM. 90 tablet 3   betamethasone, augmented, (DIPROLENE) 0.05 % gel Apply to aa's rash  BID PRN only use when needed 30 g 0   Calcium-Magnesium-Vitamin D (CALCIUM 1200+D3 PO) Take by mouth 2 (two) times daily.     Crisaborole (EUCRISA) 2 % OINT Apply 1 application. topically in the morning and at bedtime. 60 g 4   docusate sodium (COLACE) 50 MG capsule Take 1 capsule (50 mg total) by mouth daily as needed for mild constipation. 90 capsule 3   fexofenadine-pseudoephedrine (ALLEGRA-D) 60-120 MG 12 hr tablet Take 1 tablet by mouth 2 (two) times daily. 20 tablet 0   Multiple Vitamin (MULTI-VITAMINS) TABS Take by mouth.     senna-docusate (SENEXON-S) 8.6-50 MG tablet TAKE 1 TABLET BY MOUTH DAILY AS NEEDED FOR CONSTIPATION. 90 tablet 3   telmisartan (MICARDIS) 20 MG tablet Take 1 tablet (20 mg total) by mouth daily. 90 tablet 3   triamcinolone ointment (KENALOG) 0.1 % Apply 1 application topically 2 (two) times daily as needed.     No current facility-administered medications on file prior to visit.     ROS see history of present illness  Objective  Physical Exam Vitals:   05/29/22 0829  BP: 122/80  Pulse: 67  Temp: 98 F (36.7 C)  SpO2: 98%    BP Readings from Last 3 Encounters:  05/29/22 122/80  01/01/22 120/64  10/16/21 121/73   Wt Readings from Last 3 Encounters:  05/29/22 168 lb 12.8 oz (76.6 kg)  01/08/22 169 lb (76.7 kg)  01/01/22 169 lb (76.7 kg)    Physical Exam Constitutional:      General: She is not in acute distress.    Appearance: Normal appearance.  HENT:     Head: Normocephalic.  Cardiovascular:     Rate and Rhythm: Normal rate and regular rhythm.     Heart sounds: Normal heart sounds.  Pulmonary:     Effort: Pulmonary effort is normal.     Breath sounds: Normal breath sounds.  Skin:    General: Skin is warm and dry.  Neurological:     General: No focal deficit present.     Mental Status: She is alert.  Psychiatric:        Mood and Affect: Mood normal.        Behavior: Behavior normal.    Assessment/Plan: Please see individual  problem list.  Overactive bladder Assessment & Plan: D/C Trospium. Start Oxybutynin ER 10 mg daily.  Orders: -     oxyBUTYnin Chloride ER; Take 1 tablet (10 mg total) by mouth at bedtime.  Dispense: 90 tablet; Refill: 3  Essential hypertension Assessment & Plan: Chronic. Stable on Norvasc 2.5 mg BID and Telmisartan 25 mg daily. Continue.    Hyperlipidemia, unspecified hyperlipidemia type Assessment & Plan: Chronic. Stable on Lipitor 10 mg daily. Continue. Encouraged healthy diet and exercise.     Return if symptoms worsen or fail to improve.   Melissa Morrow, NP-C [place]

## 2022-05-29 NOTE — Assessment & Plan Note (Signed)
Chronic. Stable on Norvasc 2.5 mg BID and Telmisartan 25 mg daily. Continue.

## 2022-05-29 NOTE — Assessment & Plan Note (Addendum)
D/C Trospium. Start Oxybutynin ER 10 mg daily.

## 2022-05-29 NOTE — Assessment & Plan Note (Signed)
Chronic. Stable on Lipitor 10 mg daily. Continue. Encouraged healthy diet and exercise.

## 2022-05-30 ENCOUNTER — Other Ambulatory Visit: Payer: Self-pay | Admitting: Nurse Practitioner

## 2022-05-30 ENCOUNTER — Telehealth: Payer: Self-pay | Admitting: Nurse Practitioner

## 2022-05-30 DIAGNOSIS — Z1231 Encounter for screening mammogram for malignant neoplasm of breast: Secondary | ICD-10-CM

## 2022-05-30 NOTE — Telephone Encounter (Signed)
Order has been placed.

## 2022-05-30 NOTE — Telephone Encounter (Signed)
Patient saw Tomasita Morrow on 1/25. She has made an [facility] appointment with Ollen Gross in March . She would like to have her mammogram done at [facility], patient is requesting a referral.

## 2022-07-02 DIAGNOSIS — R69 Illness, unspecified: Secondary | ICD-10-CM | POA: Diagnosis not present

## 2022-07-07 ENCOUNTER — Ambulatory Visit
Admission: RE | Admit: 2022-07-07 | Discharge: 2022-07-07 | Disposition: A | Discharge status: Home / Self Care | Payer: Medicare HMO | Source: Ambulatory Visit | Attending: Nurse Practitioner | Admitting: Nurse Practitioner

## 2022-07-07 DIAGNOSIS — Z1231 Encounter for screening mammogram for malignant neoplasm of breast: Secondary | ICD-10-CM | POA: Diagnosis not present

## 2022-07-18 DIAGNOSIS — R7303 Prediabetes: Secondary | ICD-10-CM | POA: Insufficient documentation

## 2022-07-18 NOTE — Progress Notes (Unsigned)
Tomasita Morrow, NP-C Phone: [phone]  Melissa [name] is a 67 y.o. female who presents today for transfer of care. She has no complaints or new concerns today. She is doing well on all of her medications. She was evaluated by Nephrology recently for worsening CKD. She has a follow up appointment with them in June.   HYPERTENSION Disease Monitoring: Blood pressure range- Not checking Chest pain- No      Dyspnea- No Medications: Compliance- Telmisartan and Norvasc Lightheadedness- No   Edema- No  Lab Results  Component Value Date   NA 141 01/09/2022   K 4.4 01/09/2022   CO2 26 01/09/2022   GLUCOSE 89 01/09/2022   BUN 36 (H) 01/09/2022   CREATININE 1.38 (H) 01/09/2022   CALCIUM 9.7 01/09/2022    PREDIABETES Disease Monitoring: Blood Sugar ranges- Not checking Polyuria/phagia/dipsia- No      Optho- Yes Medications: Compliance- Diet controlled Hypoglycemic symptoms- No  Lab Results  Component Value Date   HGBA1C 6.1 01/09/2022     HYPERLIPIDEMIA Disease Monitoring: See symptoms for Hypertension Medications: Compliance- Lipitor Right upper quadrant pain- No  Muscle aches- No  Lab Results  Component Value Date   CHOL 170 01/09/2022   HDL 82.90 01/09/2022   LDLCALC 79 01/09/2022   TRIG 43.0 01/09/2022   CHOLHDL 2 01/09/2022      Social History   Tobacco Use  Smoking Status Never  Smokeless Tobacco Never    Current Outpatient Medications on File Prior to Visit  Medication Sig Dispense Refill   aspirin EC 81 MG tablet Take 81 mg by mouth daily.     betamethasone, augmented, (DIPROLENE) 0.05 % gel Apply to aa's rash BID PRN only use when needed 30 g 0   Calcium-Magnesium-Vitamin D (CALCIUM 1200+D3 PO) Take by mouth 2 (two) times daily.     docusate sodium (COLACE) 50 MG capsule Take 1 capsule (50 mg total) by mouth daily as needed for mild constipation. 90 capsule 3   fexofenadine-pseudoephedrine (ALLEGRA-D) 60-120 MG 12 hr tablet Take 1 tablet by mouth 2 (two)  times daily. 20 tablet 0   Multiple Vitamin (MULTI-VITAMINS) TABS Take by mouth.     oxybutynin (DITROPAN XL) 10 MG 24 hr tablet Take 1 tablet (10 mg total) by mouth at bedtime. 90 tablet 3   senna-docusate (SENEXON-S) 8.6-50 MG tablet TAKE 1 TABLET BY MOUTH DAILY AS NEEDED FOR CONSTIPATION. 90 tablet 3   ferrous sulfate 325 (65 FE) MG tablet Take 325 mg by mouth daily.     No current facility-administered medications on file prior to visit.    ROS see history of present illness  Objective  Physical Exam Vitals:   07/22/22 0948  BP: 128/78  Pulse: 60  Temp: 97.8 F (36.6 C)  SpO2: 99%    BP Readings from Last 3 Encounters:  07/22/22 128/78  05/29/22 122/80  01/01/22 120/64   Wt Readings from Last 3 Encounters:  07/22/22 169 lb 12.8 oz (77 kg)  05/29/22 168 lb 12.8 oz (76.6 kg)  01/08/22 169 lb (76.7 kg)    Physical Exam Constitutional:      General: She is not in acute distress.    Appearance: Normal appearance.  HENT:     Head: Normocephalic.  Cardiovascular:     Rate and Rhythm: Normal rate and regular rhythm.     Heart sounds: Normal heart sounds.  Pulmonary:     Effort: Pulmonary effort is normal.     Breath sounds: Normal breath sounds.  Skin:    General: Skin is warm and dry.  Neurological:     General: No focal deficit present.     Mental Status: She is alert.  Psychiatric:        Mood and Affect: Mood normal.        Behavior: Behavior normal.    Assessment/Plan: Please see individual problem list.  Essential hypertension Assessment & Plan: Chronic. Stable on Telmisartan 20 mg daily and Norvasc 2.5 mg BID. Continue. Will check CBC today.   Orders: -     amLODIPine Besylate; TAKE 1 TABLET BY MOUTH IN THE MORNING AND AT BEDTIME. CAN TAKE ADDITIONAL DOSE IF BP >130/>80  Dispense: 180 tablet; Refill: 3 -     Telmisartan; Take 1 tablet (20 mg total) by mouth daily.  Dispense: 90 tablet; Refill: 3 -     CBC with  Differential/Platelet  Hyperlipidemia, unspecified hyperlipidemia type Assessment & Plan: Chronic. Stable on Lipitor 10 mg daily. Continue.   Orders: -     Atorvastatin Calcium; Take 1 tablet (10 mg total) by mouth daily at 6 PM.  Dispense: 90 tablet; Refill: 3  Prediabetes Assessment & Plan: Last A1c- 6.1, will recheck at next appointment. Encouraged healthy diet and exercise.    Stage 3b chronic kidney disease ([place]) Assessment & Plan: Will check BMP today. Follow up with Nephrology as scheduled.   Orders: -     Basic metabolic panel  Overactive bladder Assessment & Plan: Chronic. Stable on Oxybutynin ER 10 mg daily. Continue.    Iron deficiency anemia, unspecified iron deficiency anemia type Assessment & Plan: Chronic. Taking Ferrous Sulfate 325 mg daily. Continue. Will check iron panel today.   Orders: -     IBC + Ferritin    Return in about 6 months (around 01/22/2023) for Annual Exam.   Tomasita Morrow, NP-C [place]

## 2022-07-22 ENCOUNTER — Ambulatory Visit (INDEPENDENT_AMBULATORY_CARE_PROVIDER_SITE_OTHER): Payer: Medicare HMO | Admitting: Nurse Practitioner

## 2022-07-22 ENCOUNTER — Encounter: Payer: Self-pay | Admitting: Nurse Practitioner

## 2022-07-22 VITALS — BP 128/78 | HR 60 | Temp 97.8°F | Ht 65.0 in | Wt 169.8 lb

## 2022-07-22 DIAGNOSIS — I10 Essential (primary) hypertension: Secondary | ICD-10-CM | POA: Diagnosis not present

## 2022-07-22 DIAGNOSIS — E785 Hyperlipidemia, unspecified: Secondary | ICD-10-CM | POA: Diagnosis not present

## 2022-07-22 DIAGNOSIS — N1832 Chronic kidney disease, stage 3b: Secondary | ICD-10-CM

## 2022-07-22 DIAGNOSIS — N3281 Overactive bladder: Secondary | ICD-10-CM | POA: Diagnosis not present

## 2022-07-22 DIAGNOSIS — R7303 Prediabetes: Secondary | ICD-10-CM

## 2022-07-22 DIAGNOSIS — D649 Anemia, unspecified: Secondary | ICD-10-CM

## 2022-07-22 DIAGNOSIS — D509 Iron deficiency anemia, unspecified: Secondary | ICD-10-CM

## 2022-07-22 LAB — CBC WITH DIFFERENTIAL/PLATELET
Basophils Absolute: 0 10*3/uL (ref 0.0–0.1)
Basophils Relative: 0.6 % (ref 0.0–3.0)
Eosinophils Absolute: 0.2 10*3/uL (ref 0.0–0.7)
Eosinophils Relative: 3.8 % (ref 0.0–5.0)
HCT: 35.2 % — ABNORMAL LOW (ref 36.0–46.0)
Hemoglobin: 11.8 g/dL — ABNORMAL LOW (ref 12.0–15.0)
Lymphocytes Relative: 24.3 % (ref 12.0–46.0)
Lymphs Abs: 1.3 10*3/uL (ref 0.7–4.0)
MCHC: 33.4 g/dL (ref 30.0–36.0)
MCV: 93.2 fl (ref 78.0–100.0)
Monocytes Absolute: 0.4 10*3/uL (ref 0.1–1.0)
Monocytes Relative: 8.2 % (ref 3.0–12.0)
Neutro Abs: 3.3 10*3/uL (ref 1.4–7.7)
Neutrophils Relative %: 63.1 % (ref 43.0–77.0)
Platelets: 218 10*3/uL (ref 150.0–400.0)
RBC: 3.78 Mil/uL — ABNORMAL LOW (ref 3.87–5.11)
RDW: 13.5 % (ref 11.5–15.5)
WBC: 5.3 10*3/uL (ref 4.0–10.5)

## 2022-07-22 LAB — BASIC METABOLIC PANEL
BUN: 19 mg/dL (ref 6–23)
CO2: 27 mEq/L (ref 19–32)
Calcium: 9.8 mg/dL (ref 8.4–10.5)
Chloride: 105 mEq/L (ref 96–112)
Creatinine, Ser: 0.97 mg/dL (ref 0.40–1.20)
GFR: 60.23 mL/min (ref >60.00)
Glucose, Bld: 89 mg/dL (ref 70–99)
Potassium: 4.2 mEq/L (ref 3.5–5.1)
Sodium: 141 mEq/L (ref 135–145)

## 2022-07-22 LAB — IBC + FERRITIN
Ferritin: 149.9 ng/mL (ref 10.0–291.0)
Iron: 136 ug/dL (ref 42–145)
Saturation Ratios: 51.1 % — ABNORMAL HIGH (ref 20.0–50.0)
TIBC: 266 ug/dL (ref 250.0–450.0)
Transferrin: 190 mg/dL — ABNORMAL LOW (ref 212.0–360.0)

## 2022-07-22 MED ORDER — ATORVASTATIN CALCIUM 10 MG PO TABS
10.0000 mg | ORAL_TABLET | Freq: Every day | ORAL | 3 refills | Status: DC
Start: 2023-01-02 — End: 2023-09-10

## 2022-07-22 MED ORDER — TELMISARTAN 20 MG PO TABS
20.0000 mg | ORAL_TABLET | Freq: Every day | ORAL | 3 refills | Status: DC
Start: 2023-01-02 — End: 2023-06-23

## 2022-07-22 MED ORDER — AMLODIPINE BESYLATE 2.5 MG PO TABS: ORAL_TABLET | ORAL | 3 refills | Status: DC

## 2022-07-22 NOTE — Assessment & Plan Note (Signed)
Chronic. Taking Ferrous Sulfate 325 mg daily. Continue. Will check iron panel today.

## 2022-07-22 NOTE — Assessment & Plan Note (Signed)
Chronic. Stable on Lipitor 10 mg daily. Continue.

## 2022-07-22 NOTE — Assessment & Plan Note (Signed)
Last A1c- 6.1, will recheck at next appointment. Encouraged healthy diet and exercise.

## 2022-07-22 NOTE — Assessment & Plan Note (Signed)
Chronic. Stable on Oxybutynin ER 10 mg daily. Continue.

## 2022-07-22 NOTE — Assessment & Plan Note (Signed)
Will check BMP today. Follow up with Nephrology as scheduled.

## 2022-07-22 NOTE — Assessment & Plan Note (Signed)
Chronic. Stable on Telmisartan 20 mg daily and Norvasc 2.5 mg BID. Continue. Will check CBC today.

## 2022-07-31 DIAGNOSIS — R69 Illness, unspecified: Secondary | ICD-10-CM | POA: Diagnosis not present

## 2022-08-11 DIAGNOSIS — R69 Illness, unspecified: Secondary | ICD-10-CM | POA: Diagnosis not present

## 2022-08-13 ENCOUNTER — Ambulatory Visit: Payer: Medicare HMO | Admitting: Podiatry

## 2022-08-13 ENCOUNTER — Encounter: Payer: Self-pay | Admitting: Podiatry

## 2022-08-13 ENCOUNTER — Ambulatory Visit (INDEPENDENT_AMBULATORY_CARE_PROVIDER_SITE_OTHER): Payer: Medicare HMO

## 2022-08-13 DIAGNOSIS — M722 Plantar fascial fibromatosis: Secondary | ICD-10-CM

## 2022-08-13 DIAGNOSIS — R69 Illness, unspecified: Secondary | ICD-10-CM | POA: Diagnosis not present

## 2022-08-13 DIAGNOSIS — M79672 Pain in left foot: Secondary | ICD-10-CM

## 2022-08-13 MED ORDER — TRIAMCINOLONE ACETONIDE 10 MG/ML IJ SUSP
10.0000 mg | Freq: Once | INTRAMUSCULAR | Status: AC
  Administered 2022-08-13: 10 mg

## 2022-08-13 NOTE — Progress Notes (Signed)
Subjective:   Patient ID: Melissa [name], female   DOB: 67 y.o.   MRN: [id number]   HPI Patient presents stating she has had a 1 year history of significant heel pain left and it is very sharp and constant.  She has had approximate 4 injections on this over the last year with the last one being 4 months ago and did not have any other treatments for this.  Patient does not smoke likes to be active   Review of Systems  All other systems reviewed and are negative.       Objective:  Physical Exam Vitals and nursing note reviewed.  Constitutional:      Appearance: She is well-developed.  Pulmonary:     Effort: Pulmonary effort is normal.  Musculoskeletal:        General: Normal range of motion.  Skin:    General: Skin is warm.  Neurological:     Mental Status: She is alert.     Neurovascular status intact muscle strength adequate range of motion adequate exquisite discomfort medial fascial band left at the insertional point of the tendon into the calcaneus with inflammation fluid around the medial band.  Patient is found to have good digital perfusion well-oriented x 3 no equinus condition noted     Assessment:  Acute Planter fasciitis left with inflammation fluid of the medial band that not responded so far to injections but no other treatment     Plan:  I reviewed condition we discussed the possibility of surgery or shockwave or trying to hold off on that and I have recommended injection with complete immobilization to try to see whether or not we can get this condition improved.  She wants to do this at this point I went ahead I did sterile prep and injected the medial fascial band 3 mg Kenalog 5 mg Xylocaine and I applied air fracture walker properly fitted to her lower leg to take all pressure off the foot.  Patient will be seen back to recheck and may require other treatments if this does not respond  X-rays indicate spur formation left plantar heel no indication stress  fracture arthritis

## 2022-08-14 ENCOUNTER — Other Ambulatory Visit: Payer: Self-pay | Admitting: Podiatry

## 2022-08-14 DIAGNOSIS — M722 Plantar fascial fibromatosis: Secondary | ICD-10-CM

## 2022-08-14 DIAGNOSIS — M79672 Pain in left foot: Secondary | ICD-10-CM

## 2022-08-20 ENCOUNTER — Ambulatory Visit: Payer: Medicare HMO | Admitting: Dermatology

## 2022-08-20 VITALS — BP 138/89 | HR 79

## 2022-08-20 DIAGNOSIS — D229 Melanocytic nevi, unspecified: Secondary | ICD-10-CM | POA: Diagnosis not present

## 2022-08-20 DIAGNOSIS — L578 Other skin changes due to chronic exposure to nonionizing radiation: Secondary | ICD-10-CM

## 2022-08-20 DIAGNOSIS — L2089 Other atopic dermatitis: Secondary | ICD-10-CM

## 2022-08-20 DIAGNOSIS — Z79899 Other long term (current) drug therapy: Secondary | ICD-10-CM | POA: Diagnosis not present

## 2022-08-20 DIAGNOSIS — Z1283 Encounter for screening for malignant neoplasm of skin: Secondary | ICD-10-CM

## 2022-08-20 DIAGNOSIS — L814 Other melanin hyperpigmentation: Secondary | ICD-10-CM | POA: Diagnosis not present

## 2022-08-20 DIAGNOSIS — L237 Allergic contact dermatitis due to plants, except food: Secondary | ICD-10-CM | POA: Diagnosis not present

## 2022-08-20 DIAGNOSIS — L820 Inflamed seborrheic keratosis: Secondary | ICD-10-CM

## 2022-08-20 DIAGNOSIS — L821 Other seborrheic keratosis: Secondary | ICD-10-CM | POA: Diagnosis not present

## 2022-08-20 MED ORDER — BETAMETHASONE DIPROPIONATE AUG 0.05 % EX GEL: CUTANEOUS | 2 refills | Status: DC

## 2022-08-20 NOTE — Patient Instructions (Addendum)
Cryotherapy Aftercare  Wash gently with soap and water everyday.   Apply Vaseline and Band-Aid daily until healed.     Due to recent changes in healthcare laws, you may see results of your pathology and/or laboratory studies on MyChart before the doctors have had a chance to review them. We understand that in some cases there may be results that are confusing or concerning to you. Please understand that not all results are received at the same time and often the doctors may need to interpret multiple results in order to provide you with the best plan of care or course of treatment. Therefore, we ask that you please give us 2 business days to thoroughly review all your results before contacting the office for clarification. Should we see a critical lab result, you will be contacted sooner.   If You Need Anything After Your Visit  If you have any questions or concerns for your doctor, please call our main line at [phone] and press option 4 to reach your doctor's medical assistant. If no one answers, please leave a voicemail as directed and we will return your call as soon as possible. Messages left after 4 pm will be answered the following business day.   You may also send us a message via MyChart. We typically respond to MyChart messages within 1-2 business days.  For prescription refills, please ask your pharmacy to contact our office. Our fax number is [phone].  If you have an urgent issue when the clinic is closed that cannot wait until the next business day, you can page your doctor at the number below.    Please note that while we do our best to be available for urgent issues outside of office hours, we are not available 24/7.   If you have an urgent issue and are unable to reach us, you may choose to seek medical care at your doctor's office, retail clinic, urgent care center, or emergency room.  If you have a medical emergency, please immediately call 911 or go to the  emergency department.  Pager Numbers  - Dr. Kowalski: [phone]  - Dr. Moye: [phone]  - Dr. Stewart: [phone]  In the event of inclement weather, please call our main line at [phone] for an update on the status of any delays or closures.  Dermatology Medication Tips: Please keep the boxes that topical medications come in in order to help keep track of the instructions about where and how to use these. Pharmacies typically print the medication instructions only on the boxes and not directly on the medication tubes.   If your medication is too expensive, please contact our office at [phone] option 4 or send us a message through MyChart.   We are unable to tell what your co-pay for medications will be in advance as this is different depending on your insurance coverage. However, we may be able to find a substitute medication at lower cost or fill out paperwork to get insurance to cover a needed medication.   If a prior authorization is required to get your medication covered by your insurance company, please allow us 1-2 business days to complete this process.  Drug prices often vary depending on where the prescription is filled and some pharmacies may offer cheaper prices.  The website www.goodrx.com contains coupons for medications through different pharmacies. The prices here do not account for what the cost may be with help from insurance (it may be cheaper with your insurance), but the website can   give you the price if you did not use any insurance.  - You can print the associated coupon and take it with your prescription to the pharmacy.  - You may also stop by our office during regular business hours and pick up a GoodRx coupon card.  - If you need your prescription sent electronically to a different pharmacy, notify our office through [place] MyChart or by phone at [phone] option 4.     Si Usted Necesita Algo Despus de Su Visita  Tambin puede  enviarnos un mensaje a travs de MyChart. Por lo general respondemos a los mensajes de MyChart en el transcurso de 1 a 2 das hbiles.  Para renovar recetas, por favor pida a su farmacia que se ponga en contacto con nuestra oficina. Nuestro nmero de fax es el [phone].  Si tiene un asunto urgente cuando la clnica est cerrada y que no puede esperar hasta el siguiente da hbil, puede llamar/localizar a su doctor(a) al nmero que aparece a continuacin.   Por favor, tenga en cuenta que aunque hacemos todo lo posible para estar disponibles para asuntos urgentes fuera del horario de oficina, no estamos disponibles las 24 horas del da, los 7 das de la semana.   Si tiene un problema urgente y no puede comunicarse con nosotros, puede optar por buscar atencin mdica  en el consultorio de su doctor(a), en una clnica privada, en un centro de atencin urgente o en una sala de emergencias.  Si tiene una emergencia mdica, por favor llame inmediatamente al 911 o vaya a la sala de emergencias.  Nmeros de bper  - Dr. Kowalski: [phone]  - Dra. Moye: [phone]  - Dra. Stewart: [phone]  En caso de inclemencias del tiempo, por favor llame a nuestra lnea principal al [phone] para una actualizacin sobre el estado de cualquier retraso o cierre.  Consejos para la medicacin en dermatologa: Por favor, guarde las cajas en las que vienen los medicamentos de uso tpico para ayudarle a seguir las instrucciones sobre dnde y cmo usarlos. Las farmacias generalmente imprimen las instrucciones del medicamento slo en las cajas y no directamente en los tubos del medicamento.   Si su medicamento es muy caro, por favor, pngase en contacto con nuestra oficina llamando al [phone] y presione la opcin 4 o envenos un mensaje a travs de MyChart.   No podemos decirle cul ser su copago por los medicamentos por adelantado ya que esto es diferente dependiendo de la cobertura de su seguro.  Sin embargo, es posible que podamos encontrar un medicamento sustituto a menor costo o llenar un formulario para que el seguro cubra el medicamento que se considera necesario.   Si se requiere una autorizacin previa para que su compaa de seguros cubra su medicamento, por favor permtanos de 1 a 2 das hbiles para completar este proceso.  Los precios de los medicamentos varan con frecuencia dependiendo del lugar de dnde se surte la receta y alguna farmacias pueden ofrecer precios ms baratos.  El sitio web www.goodrx.com tiene cupones para medicamentos de diferentes farmacias. Los precios aqu no tienen en cuenta lo que podra costar con la ayuda del seguro (puede ser ms barato con su seguro), pero el sitio web puede darle el precio si no utiliz ningn seguro.  - Puede imprimir el cupn correspondiente y llevarlo con su receta a la farmacia.  - Tambin puede pasar por nuestra oficina durante el horario de atencin regular y recoger una tarjeta de cupones de GoodRx.  -   Si necesita que su receta se enve electrnicamente a una farmacia diferente, informe a nuestra oficina a travs de MyChart de [place] o por telfono llamando al [phone] y presione la opcin 4.  

## 2022-08-20 NOTE — Progress Notes (Signed)
Follow-Up Visit   Subjective  Melissa [name] is a 67 y.o. female who presents for the following: Skin Cancer Screening and Full Body Skin Exam, hx of precancers.  The patient presents for Total-Body Skin Exam (TBSE) for skin cancer screening and mole check. The patient has spots, moles and lesions to be evaluated, some may be new or changing and the patient has concerns that these could be cancer.  The following portions of the chart were reviewed this encounter and updated as appropriate: medications, allergies, medical history  Review of Systems:  No other skin or systemic complaints except as noted in HPI or Assessment and Plan.  Objective  Well appearing patient in no apparent distress; mood and affect are within normal limits.  A full examination was performed including scalp, head, eyes, ears, nose, lips, neck, chest, axillae, abdomen, back, buttocks, bilateral upper extremities, bilateral lower extremities, hands, feet, fingers, toes, fingernails, and toenails. All findings within normal limits unless otherwise noted below.   Relevant physical exam findings are noted in the Assessment and Plan.  face x 5, neck/chest x 7, trunk x 4 (16) Stuck-on, waxy, tan-brown papules --Discussed benign etiology and prognosis.    Assessment & Plan   LENTIGINES, SEBORRHEIC KERATOSES, HEMANGIOMAS - Benign normal skin lesions - Benign-appearing - Call for any changes  MELANOCYTIC NEVI - Tan-brown and/or pink-flesh-colored symmetric macules and papules - Benign appearing on exam today - Observation - Call clinic for new or changing moles - Recommend daily use of broad spectrum spf 30+ sunscreen to sun-exposed areas.   ACTINIC DAMAGE - Chronic condition, secondary to cumulative UV/sun exposure - diffuse scaly erythematous macules with underlying dyspigmentation - Recommend daily broad spectrum sunscreen SPF 30+ to sun-exposed areas, reapply every 2 hours as needed.  - Staying in the  shade or wearing long sleeves, sun glasses (UVA+UVB protection) and wide brim hats (4-inch brim around the entire circumference of the hat) are also recommended for sun protection.  - Call for new or changing lesions.  ATOPIC DERMATITIS/Hand dermatitis with fissures  Exam: Scaly pink papules coalescing to plaques Chronic and persistent condition with duration or expected duration over one year. Condition is symptomatic / bothersome to patient. Not to goal. Better controlled Atopic dermatitis (eczema) is a chronic, relapsing, pruritic condition that can significantly affect quality of life. It is often associated with allergic rhinitis and/or asthma and can require treatment with topical medications, phototherapy, or in severe cases biologic injectable medication (Dupixent; Adbry) or Oral JAK inhibitors. Treatment Plan: Continue Eucrisa ointment daily   ALLERGIC CONTACT DERMATITIS Exam: clear today  Hx of  poison oak exposure Treatment Plan: Continue Diprolene cream qd-bid prn   Recommend gentle skin care.   Inflamed seborrheic keratosis (16) face x 5, neck/chest x 7, trunk x 4  Symptomatic, irritating, patient would like treated.   Destruction of lesion - face x 5, neck/chest x 7, trunk x 4 Complexity: simple   Destruction method: cryotherapy   Informed consent: discussed and consent obtained   Timeout:  patient name, date of birth, surgical site, and procedure verified Lesion destroyed using liquid nitrogen: Yes   Region frozen until ice ball extended beyond lesion: Yes   Outcome: patient tolerated procedure well with no complications   Post-procedure details: wound care instructions given    Allergic contact dermatitis due to plants, except food  Related Medications betamethasone, augmented, (DIPROLENE) 0.05 % gel Apply to aa's rash BID PRN only use when needed  SKIN CANCER SCREENING PERFORMED  TODAY.  Return in about 1 year (around 08/20/2023) for TBSE, hx of precancers  .  IAngelique Holm, CMA, am acting as scribe for Armida Sans, MD .   Documentation: I have reviewed the above documentation for accuracy and completeness, and I agree with the above.  Armida Sans, MD

## 2022-09-01 ENCOUNTER — Encounter: Payer: Self-pay | Admitting: Dermatology

## 2022-09-10 ENCOUNTER — Ambulatory Visit (INDEPENDENT_AMBULATORY_CARE_PROVIDER_SITE_OTHER): Payer: Medicare HMO | Admitting: Podiatry

## 2022-09-10 ENCOUNTER — Encounter: Payer: Self-pay | Admitting: Podiatry

## 2022-09-10 DIAGNOSIS — M722 Plantar fascial fibromatosis: Secondary | ICD-10-CM | POA: Diagnosis not present

## 2022-09-10 NOTE — Progress Notes (Signed)
Subjective:   Patient ID: Melissa [name], female   DOB: 67 y.o.   MRN: [id number]   HPI Patient states doing much better still get pain after a sit or up in the morning but I am improved   ROS      Objective:  Physical Exam  Neuro vas scaler status intact with very chronic postoperative case Planter fasciitis that responded well to complete immobilization medication     Assessment:  Acute Planter fasciitis left moderately improved with conservative measures     Plan:  H&P reviewed I have recommended immobilization to continue at least part-time over the next couple weeks and I did recommend at this time night splint in order to stretch the plantar fascia and take pressure off the tendon muscle group.  Still may require more aggressive treatment all depends on how it responds conservatively

## 2022-10-14 DIAGNOSIS — N1832 Chronic kidney disease, stage 3b: Secondary | ICD-10-CM | POA: Diagnosis not present

## 2022-10-14 DIAGNOSIS — I10 Essential (primary) hypertension: Secondary | ICD-10-CM | POA: Diagnosis not present

## 2022-10-14 DIAGNOSIS — I129 Hypertensive chronic kidney disease with stage 1 through stage 4 chronic kidney disease, or unspecified chronic kidney disease: Secondary | ICD-10-CM | POA: Diagnosis not present

## 2022-10-21 DIAGNOSIS — N1831 Chronic kidney disease, stage 3a: Secondary | ICD-10-CM | POA: Diagnosis not present

## 2022-10-21 DIAGNOSIS — I129 Hypertensive chronic kidney disease with stage 1 through stage 4 chronic kidney disease, or unspecified chronic kidney disease: Secondary | ICD-10-CM | POA: Diagnosis not present

## 2022-10-23 DIAGNOSIS — R21 Rash and other nonspecific skin eruption: Secondary | ICD-10-CM | POA: Diagnosis not present

## 2022-12-03 ENCOUNTER — Ambulatory Visit (INDEPENDENT_AMBULATORY_CARE_PROVIDER_SITE_OTHER): Payer: Medicare HMO | Admitting: Nurse Practitioner

## 2022-12-03 ENCOUNTER — Encounter: Payer: Self-pay | Admitting: Nurse Practitioner

## 2022-12-03 ENCOUNTER — Ambulatory Visit (INDEPENDENT_AMBULATORY_CARE_PROVIDER_SITE_OTHER): Payer: Medicare HMO

## 2022-12-03 VITALS — BP 128/82 | HR 58 | Temp 97.9°F | Ht 65.0 in | Wt 169.4 lb

## 2022-12-03 DIAGNOSIS — M25552 Pain in left hip: Secondary | ICD-10-CM | POA: Diagnosis not present

## 2022-12-03 MED ORDER — SENNOSIDES-DOCUSATE SODIUM 8.6-50 MG PO TABS: ORAL_TABLET | ORAL | 3 refills | Status: AC

## 2022-12-03 MED ORDER — NAPROXEN 500 MG PO TABS
500.0000 mg | ORAL_TABLET | Freq: Two times a day (BID) | ORAL | 0 refills | Status: DC | PRN
Start: 2022-12-03 — End: 2023-06-23

## 2022-12-03 NOTE — Progress Notes (Signed)
Bethanie Dicker, NP-C Phone: [phone]  Melissa [name] is a 67 y.o. female who presents today for hip pain.   Hip Pain: Patient complains of left hip pain. Onset of the symptoms was  2 months ago . Inciting event: none. The pain began after having to wear a walking boot for her plantar fasciitis. Current symptoms include is worse with weight bearing and is worse with activity . Associated symptoms: knee pain. Aggravating symptoms: any weight bearing, going up and down stairs, rising after sitting, and standing. Patient's overall course: waxing and waning but worse overall, course of pain: waxing and waning but worse overall, and course of stiffness: waxing and waning but worse overall. Patient has had prior hip problems. Previous visits for this problem: yes, last seen several years ago by previous PCP . Evaluation to date: plain films, which were normal.  Treatment to date: OTC analgesics, which have been not very effective. She has been taking Ibuprofen as need. She describes the pain as an aching and pulling sensation. She has pain with laying on her left side, making it difficult to sleep. She also has pain with trying to cross her legs.   Social History   Tobacco Use  Smoking Status Never  Smokeless Tobacco Never    Current Outpatient Medications on File Prior to Visit  Medication Sig Dispense Refill   [START ON 01/02/2023] amLODipine (NORVASC) 2.5 MG tablet TAKE 1 TABLET BY MOUTH IN THE MORNING AND AT BEDTIME. CAN TAKE ADDITIONAL DOSE IF BP >130/>80 180 tablet 3   aspirin EC 81 MG tablet Take 81 mg by mouth daily.     [START ON 01/02/2023] atorvastatin (LIPITOR) 10 MG tablet Take 1 tablet (10 mg total) by mouth daily at 6 PM. 90 tablet 3   betamethasone, augmented, (DIPROLENE) 0.05 % gel Apply to aa's rash BID PRN only use when needed 30 g 2   Calcium-Magnesium-Vitamin D (CALCIUM 1200+D3 PO) Take by mouth 2 (two) times daily.     docusate sodium (COLACE) 50 MG capsule Take 1 capsule (50 mg  total) by mouth daily as needed for mild constipation. 90 capsule 3   ferrous sulfate 325 (65 FE) MG tablet Take 325 mg by mouth daily.     fexofenadine-pseudoephedrine (ALLEGRA-D) 60-120 MG 12 hr tablet Take 1 tablet by mouth 2 (two) times daily. 20 tablet 0   Multiple Vitamin (MULTI-VITAMINS) TABS Take by mouth.     oxybutynin (DITROPAN XL) 10 MG 24 hr tablet Take 1 tablet (10 mg total) by mouth at bedtime. 90 tablet 3   [START ON 01/02/2023] telmisartan (MICARDIS) 20 MG tablet Take 1 tablet (20 mg total) by mouth daily. 90 tablet 3   No current facility-administered medications on file prior to visit.    ROS see history of present illness  Objective  Physical Exam Vitals:   12/03/22 1307  BP: 128/82  Pulse: (!) 58  Temp: 97.9 F (36.6 C)  SpO2: 97%    BP Readings from Last 3 Encounters:  12/03/22 128/82  08/20/22 138/89  07/22/22 128/78   Wt Readings from Last 3 Encounters:  12/03/22 169 lb 6.4 oz (76.8 kg)  07/22/22 169 lb 12.8 oz (77 kg)  05/29/22 168 lb 12.8 oz (76.6 kg)    Physical Exam Constitutional:      General: She is not in acute distress.    Appearance: Normal appearance.  HENT:     Head: Normocephalic.  Cardiovascular:     Rate and Rhythm: Normal rate and  regular rhythm.     Heart sounds: Normal heart sounds.  Pulmonary:     Effort: Pulmonary effort is normal.     Breath sounds: Normal breath sounds.  Musculoskeletal:     Right hip: Normal.     Left hip: Tenderness present. No crepitus. Decreased range of motion (external rotation and flexion limited by pain). Normal strength.  Skin:    General: Skin is warm and dry.  Neurological:     General: No focal deficit present.     Mental Status: She is alert.  Psychiatric:        Mood and Affect: Mood normal.        Behavior: Behavior normal.    Assessment/Plan: Please see individual problem list.  Left hip pain Assessment & Plan: Will get x-ray today in office and contact patient with results.  She politely declined referral to Orthopedics at this time, she would like to await her x-ray results. Will send in Naproxen BID PRN to help with pain, counseled on not taking Ibuprofen also. Advised alternating ice and heat on painful areas.   Orders: -     DG HIP UNILAT W OR W/O PELVIS 2-3 VIEWS LEFT; Future -     Naproxen; Take 1 tablet (500 mg total) by mouth 2 (two) times daily as needed for mild pain or moderate pain.  Dispense: 30 tablet; Refill: 0  Other orders -     Sennosides-Docusate Sodium; TAKE 1 TABLET BY MOUTH DAILY AS NEEDED FOR CONSTIPATION.  Dispense: 90 tablet; Refill: 3    Return if symptoms worsen or fail to improve.   Bethanie Dicker, NP-C [facility] - [employer]

## 2022-12-03 NOTE — Assessment & Plan Note (Addendum)
Will get x-ray today in office and contact patient with results. She politely declined referral to Orthopedics at this time, she would like to await her x-ray results. Will send in Naproxen BID PRN to help with pain, counseled on not taking Ibuprofen also. Advised alternating ice and heat on painful areas.

## 2022-12-18 ENCOUNTER — Encounter (INDEPENDENT_AMBULATORY_CARE_PROVIDER_SITE_OTHER): Payer: Self-pay

## 2022-12-24 ENCOUNTER — Ambulatory Visit: Payer: Medicare HMO

## 2022-12-24 ENCOUNTER — Ambulatory Visit: Payer: Medicare HMO | Admitting: Podiatry

## 2022-12-24 ENCOUNTER — Encounter: Payer: Self-pay | Admitting: Podiatry

## 2022-12-24 ENCOUNTER — Telehealth: Payer: Self-pay | Admitting: Nurse Practitioner

## 2022-12-24 DIAGNOSIS — M722 Plantar fascial fibromatosis: Secondary | ICD-10-CM

## 2022-12-24 MED ORDER — MELOXICAM 15 MG PO TABS
15.0000 mg | ORAL_TABLET | Freq: Every day | ORAL | 2 refills | Status: DC
Start: 2022-12-24 — End: 2023-04-06

## 2022-12-24 MED ORDER — TRIAMCINOLONE ACETONIDE 10 MG/ML IJ SUSP
10.0000 mg | Freq: Once | INTRAMUSCULAR | Status: AC
Start: 2022-12-24 — End: 2022-12-24
  Administered 2022-12-24: 10 mg via INTRA_ARTICULAR

## 2022-12-24 NOTE — Progress Notes (Signed)
  Patient was seen, measured for custom molded foot orthotics patient Plantar fasciitis BIL feet bony prominence at tuberosities of 5th MT's with Callus build up  Patient will benefit from CFO's as they will help provide total contact to MLA's helping to better distribute body weight across BIL feet greater reducing plantar pressure and pain depressions ordered at tuberosities to reduce pressure orthotics will encourage FF and RF alignment  Patient was scanned items to be ordered and fit when in   [employer], CFo, CFm

## 2022-12-24 NOTE — Telephone Encounter (Signed)
Pt called stating she would like a referral to ortho for her left hip

## 2022-12-24 NOTE — Progress Notes (Signed)
Subjective:   Patient ID: Melissa [name], female   DOB: 67 y.o.   MRN: [id number]   HPI Patient states was doing well but has started to develop pain again in the left heel and is getting some pain in the hip and knee also    ROS      Objective:  Physical Exam  Neurovascular status intact with discomfort plantar heel left with inflammation and history of this of a chronic nature.     Assessment:  Chronic Planter fasciitis left     Plan:  Reviewed its relationship to hip and knee and patient had been improving and went ahead did 1 more steroid injection left plantar fascia 3 mg Kenalog 5 mg Xylocaine and then casted for functional orthotics by pedorthist to lift up the arch take pressure off the foot.  Reappoint when returned

## 2022-12-25 ENCOUNTER — Other Ambulatory Visit: Payer: Self-pay | Admitting: Nurse Practitioner

## 2022-12-25 DIAGNOSIS — M25552 Pain in left hip: Secondary | ICD-10-CM

## 2022-12-30 DIAGNOSIS — M7062 Trochanteric bursitis, left hip: Secondary | ICD-10-CM | POA: Diagnosis not present

## 2023-01-02 ENCOUNTER — Telehealth: Payer: Self-pay | Admitting: Nurse Practitioner

## 2023-01-02 NOTE — Telephone Encounter (Signed)
Copied from CRM [id number]. Topic: Medicare AWV >> Jan 02, 2023 11:01 AM Payton Doughty wrote: Reason for CRM: LM 01/02/2023 to schedule AWV   Verlee Rossetti; Care Guide Ambulatory Clinical Support [place] [facility] Direct Dial: [phone]

## 2023-01-06 ENCOUNTER — Ambulatory Visit (INDEPENDENT_AMBULATORY_CARE_PROVIDER_SITE_OTHER): Payer: Medicare HMO | Admitting: Nurse Practitioner

## 2023-01-06 ENCOUNTER — Encounter: Payer: Self-pay | Admitting: Nurse Practitioner

## 2023-01-06 ENCOUNTER — Encounter: Payer: Medicare HMO | Admitting: Family Medicine

## 2023-01-06 VITALS — BP 132/80 | HR 53 | Temp 97.9°F | Ht 65.0 in | Wt 170.4 lb

## 2023-01-06 DIAGNOSIS — Z1329 Encounter for screening for other suspected endocrine disorder: Secondary | ICD-10-CM | POA: Diagnosis not present

## 2023-01-06 DIAGNOSIS — I10 Essential (primary) hypertension: Secondary | ICD-10-CM | POA: Diagnosis not present

## 2023-01-06 DIAGNOSIS — D509 Iron deficiency anemia, unspecified: Secondary | ICD-10-CM

## 2023-01-06 DIAGNOSIS — E785 Hyperlipidemia, unspecified: Secondary | ICD-10-CM | POA: Diagnosis not present

## 2023-01-06 DIAGNOSIS — R7303 Prediabetes: Secondary | ICD-10-CM | POA: Diagnosis not present

## 2023-01-06 DIAGNOSIS — E559 Vitamin D deficiency, unspecified: Secondary | ICD-10-CM

## 2023-01-06 DIAGNOSIS — N1832 Chronic kidney disease, stage 3b: Secondary | ICD-10-CM | POA: Diagnosis not present

## 2023-01-06 DIAGNOSIS — Z0000 Encounter for general adult medical examination without abnormal findings: Secondary | ICD-10-CM | POA: Diagnosis not present

## 2023-01-06 HISTORY — DX: Encounter for general adult medical examination without abnormal findings: Z00.00

## 2023-01-06 LAB — COMPREHENSIVE METABOLIC PANEL
ALT: 18 U/L (ref 0–35)
AST: 18 U/L (ref 0–37)
Albumin: 4.1 g/dL (ref 3.5–5.2)
Alkaline Phosphatase: 19 U/L — ABNORMAL LOW (ref 39–117)
BUN: 27 mg/dL — ABNORMAL HIGH (ref 6–23)
CO2: 28 meq/L (ref 19–32)
Calcium: 10.1 mg/dL (ref 8.4–10.5)
Chloride: 104 meq/L (ref 96–112)
Creatinine, Ser: 1.16 mg/dL (ref 0.40–1.20)
GFR: 48.44 mL/min — ABNORMAL LOW (ref >60.00)
Glucose, Bld: 85 mg/dL (ref 70–99)
Potassium: 4.8 meq/L (ref 3.5–5.1)
Sodium: 139 meq/L (ref 135–145)
Total Bilirubin: 0.6 mg/dL (ref 0.2–1.2)
Total Protein: 6.4 g/dL (ref 6.0–8.3)

## 2023-01-06 LAB — LIPID PANEL
Cholesterol: 170 mg/dL (ref 0–200)
HDL: 71.5 mg/dL (ref >39.00)
LDL Cholesterol: 86 mg/dL (ref 0–99)
NonHDL: 98.06
Total CHOL/HDL Ratio: 2
Triglycerides: 58 mg/dL (ref 0.0–149.0)
VLDL: 11.6 mg/dL (ref 0.0–40.0)

## 2023-01-06 LAB — CBC WITH DIFFERENTIAL/PLATELET
Basophils Absolute: 0 10*3/uL (ref 0.0–0.1)
Basophils Relative: 0.4 % (ref 0.0–3.0)
Eosinophils Absolute: 0.2 10*3/uL (ref 0.0–0.7)
Eosinophils Relative: 2.6 % (ref 0.0–5.0)
HCT: 35.7 % — ABNORMAL LOW (ref 36.0–46.0)
Hemoglobin: 11.6 g/dL — ABNORMAL LOW (ref 12.0–15.0)
Lymphocytes Relative: 29.6 % (ref 12.0–46.0)
Lymphs Abs: 1.9 10*3/uL (ref 0.7–4.0)
MCHC: 32.4 g/dL (ref 30.0–36.0)
MCV: 94.5 fl (ref 78.0–100.0)
Monocytes Absolute: 0.6 10*3/uL (ref 0.1–1.0)
Monocytes Relative: 9 % (ref 3.0–12.0)
Neutro Abs: 3.7 10*3/uL (ref 1.4–7.7)
Neutrophils Relative %: 58.4 % (ref 43.0–77.0)
Platelets: 179 10*3/uL (ref 150.0–400.0)
RBC: 3.78 Mil/uL — ABNORMAL LOW (ref 3.87–5.11)
RDW: 13.8 % (ref 11.5–15.5)
WBC: 6.4 10*3/uL (ref 4.0–10.5)

## 2023-01-06 LAB — HEMOGLOBIN A1C: Hgb A1c MFr Bld: 5.9 % (ref 4.6–6.5)

## 2023-01-06 NOTE — Progress Notes (Unsigned)
Bethanie Dicker, NP-C Phone: [phone]  Melissa [name] is a 67 y.o. female who presents today for annual exam. She has no complaints or new concerns today. She is doing well on all of her medications.   Diet: Well balanced- cooks mostly at home, decreased sweets and carbs  Exercise: Going to senior classes and doing pool exercises at the [facility] 3-4 days per week Pap smear: 04/03/2017- no longer indicated Colonoscopy: 04/18/2016- 10 year recall Mammogram: 07/07/2022 Family history-  Colon cancer: No  Breast cancer: No  Ovarian cancer: No Menses: Postmenopausal Sexually active: No Vaccines-   Flu: Not due  Tetanus: 05/29/2017  Shingles: Completed  COVID19: x 4  Pneumonia: Completed HIV screening: Declined Hep C Screening: Negative Tobacco use: No Alcohol use: No Illicit Drug use: No Dentist: Yes Ophthalmology: Yes  Social History   Tobacco Use  Smoking Status Never  Smokeless Tobacco Never    Current Outpatient Medications on File Prior to Visit  Medication Sig Dispense Refill   amLODipine (NORVASC) 2.5 MG tablet TAKE 1 TABLET BY MOUTH IN THE MORNING AND AT BEDTIME. CAN TAKE ADDITIONAL DOSE IF BP >130/>80 180 tablet 3   aspirin EC 81 MG tablet Take 81 mg by mouth daily.     atorvastatin (LIPITOR) 10 MG tablet Take 1 tablet (10 mg total) by mouth daily at 6 PM. 90 tablet 3   betamethasone, augmented, (DIPROLENE) 0.05 % gel Apply to aa's rash BID PRN only use when needed 30 g 2   Calcium-Magnesium-Vitamin D (CALCIUM 1200+D3 PO) Take by mouth 2 (two) times daily.     docusate sodium (COLACE) 50 MG capsule Take 1 capsule (50 mg total) by mouth daily as needed for mild constipation. 90 capsule 3   ferrous sulfate 325 (65 FE) MG tablet Take 325 mg by mouth daily.     fexofenadine-pseudoephedrine (ALLEGRA-D) 60-120 MG 12 hr tablet Take 1 tablet by mouth 2 (two) times daily. 20 tablet 0   meloxicam (MOBIC) 15 MG tablet Take 1 tablet (15 mg total) by mouth daily. 30 tablet 2    Multiple Vitamin (MULTI-VITAMINS) TABS Take by mouth.     naproxen (NAPROSYN) 500 MG tablet Take 1 tablet (500 mg total) by mouth 2 (two) times daily as needed for mild pain or moderate pain. 30 tablet 0   oxybutynin (DITROPAN XL) 10 MG 24 hr tablet Take 1 tablet (10 mg total) by mouth at bedtime. 90 tablet 3   senna-docusate (SENEXON-S) 8.6-50 MG tablet TAKE 1 TABLET BY MOUTH DAILY AS NEEDED FOR CONSTIPATION. 90 tablet 3   telmisartan (MICARDIS) 20 MG tablet Take 1 tablet (20 mg total) by mouth daily. 90 tablet 3   No current facility-administered medications on file prior to visit.    ROS see history of present illness  Objective  Physical Exam Vitals:   01/06/23 0834  BP: 132/80  Pulse: (!) 53  Temp: 97.9 F (36.6 C)  SpO2: 99%    BP Readings from Last 3 Encounters:  01/06/23 132/80  12/03/22 128/82  08/20/22 138/89   Wt Readings from Last 3 Encounters:  01/06/23 170 lb 6.4 oz (77.3 kg)  12/03/22 169 lb 6.4 oz (76.8 kg)  07/22/22 169 lb 12.8 oz (77 kg)    Physical Exam Constitutional:      General: She is not in acute distress.    Appearance: Normal appearance.  HENT:     Head: Normocephalic.     Right Ear: Tympanic membrane normal.     Left  Ear: Tympanic membrane normal.     Nose: Nose normal.     Mouth/Throat:     Mouth: Mucous membranes are moist.     Pharynx: Oropharynx is clear.  Eyes:     Conjunctiva/sclera: Conjunctivae normal.     Pupils: Pupils are equal, round, and reactive to light.  Neck:     Thyroid: No thyromegaly.  Cardiovascular:     Rate and Rhythm: Normal rate and regular rhythm.     Heart sounds: Normal heart sounds.  Pulmonary:     Effort: Pulmonary effort is normal.     Breath sounds: Normal breath sounds.  Abdominal:     General: Abdomen is flat. Bowel sounds are normal.     Palpations: Abdomen is soft. There is no mass.     Tenderness: There is no abdominal tenderness.  Musculoskeletal:        General: Normal range of motion.   Lymphadenopathy:     Cervical: No cervical adenopathy.  Skin:    General: Skin is warm and dry.     Findings: No rash.  Neurological:     General: No focal deficit present.     Mental Status: She is alert.  Psychiatric:        Mood and Affect: Mood normal.        Behavior: Behavior normal.    Assessment/Plan: Please see individual problem list.  Preventative health care Assessment & Plan: Physical exam complete. Lab work as outlined. Will contact patient with results. Pap- no longer indicated. Mammogram- UTD. Colonoscopy- UTD. Flu vaccine not due. Tetanus vaccine- UTD. Shingles vaccines completed. Pneumonia vaccines completed. Declined additional COVID vaccines. Declined HIV screening. Hep C screening- negative. Recommended follow ups with Dentist and Ophthalmology for annual exams. Encouraged to continue healthy diet and exercise. Return to care in 1 year, sooner PRN.    Essential hypertension Assessment & Plan: Chronic. Stable on Telmisartan 20 mg daily and Norvasc 2.5 mg BID. Continue. Will check CBC today.   Orders: -     CBC with Differential/Platelet  Hyperlipidemia, unspecified hyperlipidemia type Assessment & Plan: Chronic. Stable on Lipitor 10 mg daily. Continue. Will check lipids today.   Orders: -     Lipid panel  Prediabetes Assessment & Plan: Chronic. Stable with diet control. Last A1c- 6.1. Will check A1c today. Encouraged healthy diet and exercise.   Orders: -     Hemoglobin A1c  Stage 3b chronic kidney disease (HCC) Assessment & Plan: GFR 60 in March 2024. Will check CMP today. Encouraged adequate fluid intake and to avoid NSAIDs. Follow up with Nephrology as scheduled. Will continue to monitor.   Orders: -     Comprehensive metabolic panel  Iron deficiency anemia, unspecified iron deficiency anemia type Assessment & Plan: Chronic. Taking Ferrous Sulfate 325 mg daily. Continue. Will check iron panel today.   Orders: -     IBC +  Ferritin  Vitamin D deficiency -     VITAMIN D 25 Hydroxy (Vit-D Deficiency, Fractures)  Thyroid disorder screen -     TSH   Return in about 1 year (around 01/06/2024) for Annual Exam, sooner PRN.   Bethanie Dicker, NP-C [facility] - [employer]

## 2023-01-07 LAB — IBC + FERRITIN
Ferritin: 143.8 ng/mL (ref 10.0–291.0)
Iron: 130 ug/dL (ref 42–145)
Saturation Ratios: 47.6 % (ref 20.0–50.0)
TIBC: 273 ug/dL (ref 250.0–450.0)
Transferrin: 195 mg/dL — ABNORMAL LOW (ref 212.0–360.0)

## 2023-01-07 LAB — VITAMIN D 25 HYDROXY (VIT D DEFICIENCY, FRACTURES): VITD: 84.17 ng/mL (ref 30.00–100.00)

## 2023-01-07 LAB — TSH: TSH: 1.51 u[IU]/mL (ref 0.35–5.50)

## 2023-01-08 NOTE — Assessment & Plan Note (Signed)
Physical exam complete. Lab work as outlined. Will contact patient with results. Pap- no longer indicated. Mammogram- UTD. Colonoscopy- UTD. Flu vaccine not due. Tetanus vaccine- UTD. Shingles vaccines completed. Pneumonia vaccines completed. Declined additional COVID vaccines. Declined HIV screening. Hep C screening- negative. Recommended follow ups with Dentist and Ophthalmology for annual exams. Encouraged to continue healthy diet and exercise. Return to care in 1 year, sooner PRN.

## 2023-01-08 NOTE — Assessment & Plan Note (Signed)
Chronic. Taking Ferrous Sulfate 325 mg daily. Continue. Will check iron panel today.

## 2023-01-08 NOTE — Assessment & Plan Note (Signed)
Chronic. Stable on Telmisartan 20 mg daily and Norvasc 2.5 mg BID. Continue. Will check CBC today.

## 2023-01-08 NOTE — Assessment & Plan Note (Signed)
Chronic. Stable on Lipitor 10 mg daily. Continue. Will check lipids today.  

## 2023-01-08 NOTE — Assessment & Plan Note (Signed)
Chronic. Stable with diet control. Last A1c- 6.1. Will check A1c today. Encouraged healthy diet and exercise.

## 2023-01-08 NOTE — Assessment & Plan Note (Signed)
GFR 60 in March 2024. Will check CMP today. Encouraged adequate fluid intake and to avoid NSAIDs. Follow up with Nephrology as scheduled. Will continue to monitor.

## 2023-01-14 ENCOUNTER — Ambulatory Visit: Payer: Medicare HMO

## 2023-01-14 NOTE — Progress Notes (Signed)
Patient presents today to pick up custom molded foot orthotics, diagnosed with Plantar Fasciitis by Dr. Charlsie Merles.   Orthotics were dispensed and fit was satisfactory. Reviewed instructions for break-in and wear. Written instructions given to patient.  Patient will follow up as needed.   Melissa [name] Cped, CFo, CFm

## 2023-01-21 ENCOUNTER — Ambulatory Visit (INDEPENDENT_AMBULATORY_CARE_PROVIDER_SITE_OTHER): Payer: Medicare HMO | Admitting: *Deleted

## 2023-01-21 VITALS — Ht 65.0 in | Wt 168.0 lb

## 2023-01-21 DIAGNOSIS — Z780 Asymptomatic menopausal state: Secondary | ICD-10-CM

## 2023-01-21 DIAGNOSIS — Z0000 Encounter for general adult medical examination without abnormal findings: Secondary | ICD-10-CM | POA: Diagnosis not present

## 2023-01-21 NOTE — Progress Notes (Signed)
Subjective:   Melissa [name] is a 67 y.o. female who presents for Medicare Annual (Subsequent) preventive examination.  Visit Complete: Virtual  I connected with  Melissa [name] on 01/21/23 by a audio enabled telemedicine application and verified that I am speaking with the correct person using two identifiers.  Patient Location: Home  Provider Location: Office/Clinic  I discussed the limitations of evaluation and management by telemedicine. The patient expressed understanding and agreed to proceed.  Patient Medicare AWV questionnaire was completed by the patient on 01/19/23; I have confirmed that all information answered by patient is correct and no changes since this date.  Vital Signs: Unable to obtain new vitals due to this being a telehealth visit.   Cardiac Risk Factors include: advanced age (>28men, >6 women);dyslipidemia;hypertension     Objective:    Today's Vitals   01/21/23 1247  Weight: 168 lb (76.2 kg)  Height: 5\' 5"  (1.651 m)   Body mass index is 27.96 kg/m.     01/21/2023    1:00 PM 01/08/2022    8:42 AM 10/02/2021   10:25 AM 12/19/2020    1:28 PM 04/18/2016    1:23 PM  Advanced Directives  Does Patient Have a Medical Advance Directive? No No No No No  Would patient like information on creating a medical advance directive? No - Patient declined No - Patient declined No - Patient declined No - Patient declined     Current Medications (verified) Outpatient Encounter Medications as of 01/21/2023  Medication Sig   amLODipine (NORVASC) 2.5 MG tablet TAKE 1 TABLET BY MOUTH IN THE MORNING AND AT BEDTIME. CAN TAKE ADDITIONAL DOSE IF BP >130/>80   aspirin EC 81 MG tablet Take 81 mg by mouth daily.   atorvastatin (LIPITOR) 10 MG tablet Take 1 tablet (10 mg total) by mouth daily at 6 PM.   betamethasone, augmented, (DIPROLENE) 0.05 % gel Apply to aa's rash BID PRN only use when needed   Calcium-Magnesium-Vitamin D (CALCIUM 1200+D3 PO) Take by mouth 2 (two) times  daily.   docusate sodium (COLACE) 50 MG capsule Take 1 capsule (50 mg total) by mouth daily as needed for mild constipation.   ferrous sulfate 325 (65 FE) MG tablet Take 325 mg by mouth daily.   fexofenadine-pseudoephedrine (ALLEGRA-D) 60-120 MG 12 hr tablet Take 1 tablet by mouth 2 (two) times daily.   meloxicam (MOBIC) 15 MG tablet Take 1 tablet (15 mg total) by mouth daily.   Multiple Vitamin (MULTI-VITAMINS) TABS Take by mouth.   oxybutynin (DITROPAN XL) 10 MG 24 hr tablet Take 1 tablet (10 mg total) by mouth at bedtime.   senna-docusate (SENEXON-S) 8.6-50 MG tablet TAKE 1 TABLET BY MOUTH DAILY AS NEEDED FOR CONSTIPATION.   telmisartan (MICARDIS) 20 MG tablet Take 1 tablet (20 mg total) by mouth daily.   naproxen (NAPROSYN) 500 MG tablet Take 1 tablet (500 mg total) by mouth 2 (two) times daily as needed for mild pain or moderate pain.   No facility-administered encounter medications on file as of 01/21/2023.    Allergies (verified) Terfenadine   History: Past Medical History:  Diagnosis Date   Actinic keratoses    Allergy    Arthritis    Chicken pox    Colon polyps    COVID-19    2021   Dermatitis    GERD (gastroesophageal reflux disease)    Heart murmur    youth   Hyperlipidemia    Hypertension    Preventative health care 01/06/2023  Urgency incontinence    Past Surgical History:  Procedure Laterality Date   BUNIONECTOMY Bilateral    CATARACT EXTRACTION W/PHACO Left 10/02/2021   Procedure: CATARACT EXTRACTION PHACO AND INTRAOCULAR LENS PLACEMENT (IOC) LEFT;  Surgeon: Lockie Mola, MD;  Location: [facility];  Service: Ophthalmology;  Laterality: Left;  3.83 00:45.7   CATARACT EXTRACTION W/PHACO Right 10/16/2021   Procedure: CATARACT EXTRACTION PHACO AND INTRAOCULAR LENS PLACEMENT (IOC) RIGHT 5.10 00:58.2;  Surgeon: Lockie Mola, MD;  Location: [facility];  Service: Ophthalmology;  Laterality: Right;   COLONOSCOPY     COLONOSCOPY WITH  PROPOFOL N/A 04/18/2016   Procedure: COLONOSCOPY WITH PROPOFOL;  Surgeon: Scot Jun, MD;  Location: [facility];  Service: Endoscopy;  Laterality: N/A;   EYE SURGERY  2022   Cataract   TONSILLECTOMY AND ADENOIDECTOMY     Family History  Problem Relation Age of Onset   Arthritis Mother    Diabetes Mother    Stroke Mother    Arthritis Father    Diabetes Father    Early death Father    Heart disease Father        MI 34 y.o    Diabetes Sister    Breast cancer Neg Hx    Social History   Socioeconomic History   Marital status: Married    Spouse name: Not on file   Number of children: Not on file   Years of education: Not on file   Highest education level: Associate degree: occupational, Scientist, product/process development, or vocational program  Occupational History   Not on file  Tobacco Use   Smoking status: Never   Smokeless tobacco: Never  Substance and Sexual Activity   Alcohol use: Never    Comment: occasionally wine    Drug use: No   Sexual activity: Not Currently    Birth control/protection: Post-menopausal  Other Topics Concern   Not on file  Social History Narrative   Married    1 daughter age 95 as of 03/2017    Retired   Social Determinants of Corporate investment banker Strain: Low Risk  (01/19/2023)   Overall Financial Resource Strain (CARDIA)    Difficulty of Paying Living Expenses: Not hard at all  Food Insecurity: No Food Insecurity (01/19/2023)   Hunger Vital Sign    Worried About Running Out of Food in the Last Year: Never true    Ran Out of Food in the Last Year: Never true  Transportation Needs: No Transportation Needs (01/19/2023)   PRAPARE - Administrator, Civil Service (Medical): No    Lack of Transportation (Non-Medical): No  Physical Activity: Sufficiently Active (01/19/2023)   Exercise Vital Sign    Days of Exercise per Week: 4 days    Minutes of Exercise per Session: 60 min  Stress: No Stress Concern Present (01/19/2023)   [employer]  of Occupational Health - Occupational Stress Questionnaire    Feeling of Stress : Not at all  Social Connections: Socially Integrated (01/19/2023)   Social Connection and Isolation Panel [NHANES]    Frequency of Communication with Friends and Family: More than three times a week    Frequency of Social Gatherings with Friends and Family: More than three times a week    Attends Religious Services: More than 4 times per year    Active Member of [employer] or Organizations: Yes    Attends Engineer, structural: More than 4 times per year    Marital Status: Married  Tobacco Counseling Counseling given: Not Answered   Clinical Intake:  Pre-visit preparation completed: Yes  Pain : No/denies pain     BMI - recorded: 27.69 Nutritional Status: BMI 25 -29 Overweight Nutritional Risks: None Diabetes: No  How often do you need to have someone help you when you read instructions, pamphlets, or other written materials from your doctor or pharmacy?: 1 - Never  Interpreter Needed?: No  Information entered by :: R.Tomia Enlow LPN   Activities of Daily Living    01/21/2023   12:50 PM 01/19/2023   12:13 PM  In your present state of health, do you have any difficulty performing the following activities:  Hearing? 0 0  Vision? 0 0  Comment glasses   Difficulty concentrating or making decisions? 0 0  Walking or climbing stairs? 0 0  Comment stairs   Dressing or bathing? 0 0  Doing errands, shopping? 0 0  Preparing Food and eating ? N N  Using the Toilet? N N  In the past six months, have you accidently leaked urine? Y N  Comment a little   Do you have problems with loss of bowel control? N N  Managing your Medications? N N  Managing your Finances? N N  Housekeeping or managing your Housekeeping? N N    Patient Care Team: Bethanie Dicker, NP as PCP - General (Nurse Practitioner)  Indicate any recent Medical Services you may have received from other than [facility] providers in the past year  (date may be approximate).     Assessment:   This is a routine wellness examination for Devona.  Hearing/Vision screen Hearing Screening - Comments:: No issues Vision Screening - Comments:: glasses   Goals Addressed             This Visit's Progress    Patient Stated       Lose about 10 pounds       Depression Screen    01/21/2023   12:58 PM 01/21/2023   12:57 PM 07/22/2022    9:50 AM 05/29/2022    8:30 AM 01/08/2022    8:38 AM 01/01/2022    1:22 PM 12/19/2020    1:25 PM  PHQ 2/9 Scores  PHQ - 2 Score 0 0 0 0 0 0 0  PHQ- 9 Score 0 0 2        Fall Risk    01/19/2023   12:13 PM 12/03/2022    1:12 PM 07/22/2022    9:50 AM 05/29/2022    8:30 AM 01/08/2022    8:38 AM  Fall Risk   Falls in the past year? 0 1 0 0 0  Number falls in past yr: 0 0 0 0 0  Injury with Fall? 0 0 0 0 0  Risk for fall due to : No Fall Risks No Fall Risks No Fall Risks No Fall Risks   Follow up Falls prevention discussed;Falls evaluation completed Falls evaluation completed Falls evaluation completed Falls evaluation completed Falls evaluation completed    MEDICARE RISK AT HOME: Medicare Risk at Home Any stairs in or around the home?: Yes If so, are there any without handrails?: Yes Home free of loose throw rugs in walkways, pet beds, electrical cords, etc?: Yes Adequate lighting in your home to reduce risk of falls?: Yes Life alert?: No Use of a cane, walker or w/c?: No Grab bars in the bathroom?: Yes Shower chair or bench in shower?: No Elevated toilet seat or a handicapped toilet?: No    Cognitive  Function:        01/21/2023    1:00 PM 01/08/2022    8:45 AM  6CIT Screen  What Year? 0 points 0 points  What month? 0 points 0 points  What time? 0 points 0 points  Count back from 20 0 points 0 points  Months in reverse 0 points 0 points  Repeat phrase 2 points 0 points  Total Score 2 points 0 points    Immunizations Immunization History  Administered Date(s) Administered    Influenza, High Dose Seasonal PF 02/10/2020, 03/13/2021   Influenza,inj,Quad PF,6+ Mos 02/12/2018, 02/16/2019   Influenza-Unspecified 03/19/2017, 02/16/2019   PFIZER(Purple Top)SARS-COV-2 Vaccination 05/11/2019, 06/01/2019, 02/10/2020   PNEUMOCOCCAL CONJUGATE-20 01/01/2022   Pfizer Covid-19 Vaccine Bivalent Booster 89yrs & up 02/26/2021   Pneumococcal Conjugate-13 10/13/2019   Tdap 05/29/2017   Zoster Recombinant(Shingrix) 03/17/2018, 07/23/2018    TDAP status: Up to date  Flu Vaccine status: Due, Education has been provided regarding the importance of this vaccine. Advised may receive this vaccine at local pharmacy or Health Dept. Aware to provide a copy of the vaccination record if obtained from local pharmacy or Health Dept. Verbalized acceptance and understanding.  Pneumococcal vaccine status: Up to date  Covid-19 vaccine status: Information provided on how to obtain vaccines.   Qualifies for Shingles Vaccine? Yes   Zostavax completed No   Shingrix Completed?: Yes  Screening Tests Health Maintenance  Topic Date Due   INFLUENZA VACCINE  12/04/2022   COVID-19 Vaccine (5 - 2023-24 season) 01/04/2023   Medicare Annual Wellness (AWV)  01/09/2023   MAMMOGRAM  07/06/2024   Colonoscopy  04/18/2026   DTaP/Tdap/Td (2 - Td or Tdap) 05/30/2027   Pneumonia Vaccine 78+ Years old  Completed   DEXA SCAN  Completed   Hepatitis C Screening  Completed   Zoster Vaccines- Shingrix  Completed   HPV VACCINES  Aged Out     Health Maintenance  Health Maintenance Due  Topic Date Due   INFLUENZA VACCINE  12/04/2022   COVID-19 Vaccine (5 - 2023-24 season) 01/04/2023   Medicare Annual Wellness (AWV)  01/09/2023    Colorectal cancer screening: Type of screening: Colonoscopy. Completed 12/17. Repeat every 10 years  Mammogram status: Completed 3/24. Repeat every year  Bone Density status: Completed 2/22. Results reflect: Bone density results: NORMAL. Repeat every 2 years.   Lung Cancer  Screening: (Low Dose CT Chest recommended if Age 68-80 years, 20 pack-year currently smoking OR have quit w/in 15years.) does not qualify.     Additional Screening:  Hepatitis C Screening: does qualify; Completed 11/18  Vision Screening: Recommended annual ophthalmology exams for early detection of glaucoma and other disorders of the eye. Is the patient up to date with their annual eye exam?  Yes  Who is the provider or what is the name of the office in which the patient attends annual eye exams? [facility] If pt is not established with a provider, would they like to be referred to a provider to establish care? No .   Dental Screening: Recommended annual dental exams for proper oral hygiene    Community Resource Referral / Chronic Care Management: CRR required this visit?  No   CCM required this visit?  No     Plan:     I have personally reviewed and noted the following in the patient's chart:   Medical and social history Use of alcohol, tobacco or illicit drugs  Current medications and supplements including opioid prescriptions. Patient is not currently taking  opioid prescriptions. Functional ability and status Nutritional status Physical activity Advanced directives List of other physicians Hospitalizations, surgeries, and ER visits in previous 12 months Vitals Screenings to include cognitive, depression, and falls Referrals and appointments  In addition, I have reviewed and discussed with patient certain preventive protocols, quality metrics, and best practice recommendations. A written personalized care plan for preventive services as well as general preventive health recommendations were provided to patient.     Sydell Axon, LPN   [id number]   After Visit Summary: (MyChart) Due to this being a telephonic visit, the after visit summary with patients personalized plan was offered to patient via MyChart   Nurse Notes: None

## 2023-01-21 NOTE — Patient Instructions (Signed)
Melissa [name] , Thank you for taking time to come for your Medicare Wellness Visit. I appreciate your ongoing commitment to your health goals. Please review the following plan we discussed and let me know if I can assist you in the future.   Referrals/Orders/Follow-Ups/Clinician Recommendations You have an order for:  []   2D Mammogram  []   3D Mammogram  [x]   Bone Density     Please call for appointment:  [facility]  [address] [place] [address] [phone]  Remember to get your flu shot.    Make sure to wear two-piece clothing.  No lotions, powders, or deodorants the day of the appointment. Make sure to bring picture ID and insurance card.  Bring list of medications you are currently taking including any supplements.   Schedule your [place] screening mammogram through MyChart!   Log into your MyChart account.  Go to 'Visit' (or 'Appointments' if on mobile App) --> Schedule an Appointment  Under 'Select a Reason for Visit' choose the Mammogram Screening option.  Complete the pre-visit questions and select the time and place that best fits your schedule.    This is a list of the screening recommended for you and due dates:  Health Maintenance  Topic Date Due   Flu Shot  12/04/2022   COVID-19 Vaccine (5 - 2023-24 season) 01/04/2023   Medicare Annual Wellness Visit  01/21/2024   Mammogram  07/06/2024   Colon Cancer Screening  04/18/2026   DTaP/Tdap/Td vaccine (2 - Td or Tdap) 05/30/2027   Pneumonia Vaccine  Completed   DEXA scan (bone density measurement)  Completed   Hepatitis C Screening  Completed   Zoster (Shingles) Vaccine  Completed   HPV Vaccine  Aged Out    Advanced directives: (Declined) Advance directive discussed with you today. Even though you declined this today, please call our office should you change your mind, and we can give you the proper paperwork for you to fill out.  Next Medicare  Annual Wellness Visit scheduled for next year: Yes 01/27/24 @ 12:45

## 2023-02-26 ENCOUNTER — Ambulatory Visit
Admission: RE | Admit: 2023-02-26 | Discharge: 2023-02-26 | Disposition: A | Discharge status: Home / Self Care | Payer: Medicare HMO | Source: Ambulatory Visit | Attending: Nurse Practitioner | Admitting: Nurse Practitioner

## 2023-02-26 DIAGNOSIS — Z780 Asymptomatic menopausal state: Secondary | ICD-10-CM | POA: Diagnosis not present

## 2023-04-06 ENCOUNTER — Other Ambulatory Visit: Payer: Self-pay | Admitting: Podiatry

## 2023-04-07 DIAGNOSIS — M7062 Trochanteric bursitis, left hip: Secondary | ICD-10-CM | POA: Diagnosis not present

## 2023-05-18 DIAGNOSIS — Z961 Presence of intraocular lens: Secondary | ICD-10-CM | POA: Diagnosis not present

## 2023-05-18 DIAGNOSIS — H26492 Other secondary cataract, left eye: Secondary | ICD-10-CM | POA: Diagnosis not present

## 2023-05-18 DIAGNOSIS — H43813 Vitreous degeneration, bilateral: Secondary | ICD-10-CM | POA: Diagnosis not present

## 2023-05-18 DIAGNOSIS — H26491 Other secondary cataract, right eye: Secondary | ICD-10-CM | POA: Diagnosis not present

## 2023-06-01 DIAGNOSIS — H26492 Other secondary cataract, left eye: Secondary | ICD-10-CM | POA: Diagnosis not present

## 2023-06-04 DIAGNOSIS — M7062 Trochanteric bursitis, left hip: Secondary | ICD-10-CM | POA: Diagnosis not present

## 2023-06-04 DIAGNOSIS — M25552 Pain in left hip: Secondary | ICD-10-CM | POA: Diagnosis not present

## 2023-06-05 ENCOUNTER — Other Ambulatory Visit: Payer: Self-pay | Admitting: Nurse Practitioner

## 2023-06-05 DIAGNOSIS — N3281 Overactive bladder: Secondary | ICD-10-CM

## 2023-06-06 HISTORY — PX: HIP SURGERY: SHX245

## 2023-06-10 ENCOUNTER — Other Ambulatory Visit: Payer: Self-pay | Admitting: Nurse Practitioner

## 2023-06-10 DIAGNOSIS — Z1231 Encounter for screening mammogram for malignant neoplasm of breast: Secondary | ICD-10-CM

## 2023-06-11 DIAGNOSIS — M76892 Other specified enthesopathies of left lower limb, excluding foot: Secondary | ICD-10-CM | POA: Diagnosis not present

## 2023-06-12 DIAGNOSIS — M76892 Other specified enthesopathies of left lower limb, excluding foot: Secondary | ICD-10-CM | POA: Diagnosis not present

## 2023-06-16 ENCOUNTER — Other Ambulatory Visit: Payer: Self-pay | Admitting: Orthopedic Surgery

## 2023-06-19 DIAGNOSIS — H26492 Other secondary cataract, left eye: Secondary | ICD-10-CM | POA: Diagnosis not present

## 2023-06-22 ENCOUNTER — Ambulatory Visit: Payer: Self-pay | Admitting: Nurse Practitioner

## 2023-06-22 NOTE — Telephone Encounter (Signed)
 Scheduled to see Rennie Plowman, NP tomorrow.

## 2023-06-22 NOTE — Telephone Encounter (Signed)
 Copied from CRM [id number]. Topic: Clinical - Red Word Triage >> Jun 22, 2023  4:51 PM Melissa [name] wrote: Red Word that prompted transfer to Nurse Triage: Pt has heaviness in chest and coughing.   Chief Complaint: Cough Symptoms: Cough, chest pressure  Frequency: Frequent cough Pertinent Negatives: Patient denies shortness of breath, fever Disposition: [] ED /[] Urgent Care (no appt availability in office) / [x] Appointment(In office/virtual)/ []  [facility]/ [] Home Care/ [] Refused Recommended Disposition /[] [facility]/ []  Follow-up with PCP Additional Notes: Patient reports that last night she began to experience a cough. She states that the cough is not productive "but I feel something come up to my throat and it burns." Patient also reports chest pressure that she says "feels like when I had bronchitis in the past." She denies any fever or shortness of breath. Appointment made in the office for tomorrow.     Reason for Disposition  SEVERE coughing spells (e.g., whooping sound after coughing, vomiting after coughing)  Answer Assessment - Initial Assessment Questions 1. ONSET: "When did the cough begin?"      Yesterday afternoon  2. SEVERITY: "How bad is the cough today?"      Mild 3. SPUTUM: "Describe the color of your sputum" (none, dry cough; clear, white, yellow, green)     No sputum production, some burning in throat with cough  4. HEMOPTYSIS: "Are you coughing up any blood?" If so ask: "How much?" (flecks, streaks, tablespoons, etc.)     No 5. DIFFICULTY BREATHING: "Are you having difficulty breathing?" If Yes, ask: "How bad is it?" (e.g., mild, moderate, severe)    - MILD: No SOB at rest, mild SOB with walking, speaks normally in sentences, can lie down, no retractions, pulse < 100.    - MODERATE: SOB at rest, SOB with minimal exertion and prefers to sit, cannot lie down flat, speaks in phrases, mild retractions, audible wheezing, pulse 100-120.    - SEVERE: Very  SOB at rest, speaks in single words, struggling to breathe, sitting hunched forward, retractions, pulse > 120      No 6. FEVER: "Do you have a fever?" If Yes, ask: "What is your temperature, how was it measured, and when did it start?"     No 7. CARDIAC HISTORY: "Do you have any history of heart disease?" (e.g., heart attack, congestive heart failure)      Hypertension  8. LUNG HISTORY: "Do you have any history of lung disease?"  (e.g., pulmonary embolus, asthma, emphysema)     No 9. PE RISK FACTORS: "Do you have a history of blood clots?" (or: recent major surgery, recent prolonged travel, bedridden)     No 10. OTHER SYMPTOMS: "Do you have any other symptoms?" (e.g., runny nose, wheezing, chest pain)       Chest pressure  Answer Assessment - Initial Assessment Questions 1. LOCATION: "Where does it hurt?"       Mid-chest  2. RADIATION: "Does the pain go anywhere else?" (e.g., into neck, jaw, arms, back)     No 3. ONSET: "When did the chest pain begin?" (Minutes, hours or days)      Yesterday afternoon  4. PATTERN: "Does the pain come and go, or has it been constant since it started?"  "Does it get worse with exertion?"      Constant  5. DURATION: "How long does it last" (e.g., seconds, minutes, hours)     N/A 6. SEVERITY: "How bad is the pain?"  (e.g., Scale 1-10; mild,  moderate, or severe)    - MILD (1-3): doesn't interfere with normal activities     - MODERATE (4-7): interferes with normal activities or awakens from sleep    - SEVERE (8-10): excruciating pain, unable to do any normal activities       5/10 7. CARDIAC RISK FACTORS: "Do you have any history of heart problems or risk factors for heart disease?" (e.g., angina, prior heart attack; diabetes, high blood pressure, high cholesterol, smoker, or strong family history of heart disease)     Hypertension  8. PULMONARY RISK FACTORS: "Do you have any history of lung disease?"  (e.g., blood clots in lung, asthma, emphysema, birth  control pills)     No 9. CAUSE: "What do you think is causing the chest pain?"     Believes she has a cold 10. OTHER SYMPTOMS: "Do you have any other symptoms?" (e.g., dizziness, nausea, vomiting, sweating, fever, difficulty breathing, cough)       Cough  Protocols used: Chest Pain-A-AH, Cough - Acute Non-Productive-A-AH

## 2023-06-23 ENCOUNTER — Other Ambulatory Visit: Payer: Self-pay

## 2023-06-23 ENCOUNTER — Ambulatory Visit (INDEPENDENT_AMBULATORY_CARE_PROVIDER_SITE_OTHER): Payer: Medicare HMO | Admitting: Family

## 2023-06-23 ENCOUNTER — Encounter
Admission: RE | Admit: 2023-06-23 | Discharge: 2023-06-23 | Disposition: A | Discharge status: Home / Self Care | Payer: Medicare HMO | Source: Ambulatory Visit | Attending: Orthopedic Surgery | Admitting: Orthopedic Surgery

## 2023-06-23 ENCOUNTER — Encounter: Payer: Self-pay | Admitting: Family

## 2023-06-23 VITALS — Ht 65.0 in | Wt 173.0 lb

## 2023-06-23 VITALS — BP 128/76 | HR 84 | Temp 98.0°F | Ht 65.0 in | Wt 173.2 lb

## 2023-06-23 DIAGNOSIS — Z01812 Encounter for preprocedural laboratory examination: Secondary | ICD-10-CM

## 2023-06-23 DIAGNOSIS — Z01818 Encounter for other preprocedural examination: Secondary | ICD-10-CM | POA: Diagnosis not present

## 2023-06-23 DIAGNOSIS — J40 Bronchitis, not specified as acute or chronic: Secondary | ICD-10-CM | POA: Insufficient documentation

## 2023-06-23 DIAGNOSIS — Z01810 Encounter for preprocedural cardiovascular examination: Secondary | ICD-10-CM

## 2023-06-23 DIAGNOSIS — R051 Acute cough: Secondary | ICD-10-CM

## 2023-06-23 DIAGNOSIS — N1832 Chronic kidney disease, stage 3b: Secondary | ICD-10-CM | POA: Diagnosis not present

## 2023-06-23 DIAGNOSIS — I129 Hypertensive chronic kidney disease with stage 1 through stage 4 chronic kidney disease, or unspecified chronic kidney disease: Secondary | ICD-10-CM | POA: Diagnosis not present

## 2023-06-23 DIAGNOSIS — I10 Essential (primary) hypertension: Secondary | ICD-10-CM

## 2023-06-23 HISTORY — DX: Essential (primary) hypertension: I10

## 2023-06-23 HISTORY — DX: Iron deficiency anemia, unspecified: D50.9

## 2023-06-23 HISTORY — DX: Chronic kidney disease, stage 3b: N18.32

## 2023-06-23 LAB — POCT RAPID STREP A (OFFICE): Rapid Strep A Screen: NEGATIVE

## 2023-06-23 LAB — BASIC METABOLIC PANEL
Anion gap: 9 (ref 5–15)
BUN: 15 mg/dL (ref 8–23)
CO2: 25 mmol/L (ref 22–32)
Calcium: 9 mg/dL (ref 8.9–10.3)
Chloride: 104 mmol/L (ref 98–111)
Creatinine, Ser: 0.99 mg/dL (ref 0.44–1.00)
GFR, Estimated: >60 mL/min (ref >60)
Glucose, Bld: 100 mg/dL — ABNORMAL HIGH (ref 70–99)
Potassium: 3.8 mmol/L (ref 3.5–5.1)
Sodium: 138 mmol/L (ref 135–145)

## 2023-06-23 LAB — CBC
HCT: 34 % — ABNORMAL LOW (ref 36.0–46.0)
Hemoglobin: 11.4 g/dL — ABNORMAL LOW (ref 12.0–15.0)
MCH: 31.3 pg (ref 26.0–34.0)
MCHC: 33.5 g/dL (ref 30.0–36.0)
MCV: 93.4 fL (ref 80.0–100.0)
Platelets: 172 10*3/uL (ref 150–400)
RBC: 3.64 MIL/uL — ABNORMAL LOW (ref 3.87–5.11)
RDW: 14 % (ref 11.5–15.5)
WBC: 4.2 10*3/uL (ref 4.0–10.5)
nRBC: 0 % (ref 0.0–0.2)

## 2023-06-23 LAB — POC COVID19 BINAXNOW: SARS Coronavirus 2 Ag: NEGATIVE

## 2023-06-23 LAB — POCT INFLUENZA A/B
Influenza A, POC: NEGATIVE
Influenza B, POC: NEGATIVE

## 2023-06-23 NOTE — Telephone Encounter (Signed)
 Noted

## 2023-06-23 NOTE — Progress Notes (Signed)
 Assessment & Plan:  Acute cough -     POC COVID-19 BinaxNow -     POCT Influenza A/B -     POCT rapid strep A  Bronchitis Assessment & Plan: Afebrile.  No acute respiratory distress.  Discussed more likely viral etiology with duration 3 days.  Negative COVID, flu, strep point-of-care.  Discussed conservative management with Mucinex DM.  Advised more likely to derive more benefit from Mucinex DM then DayQuil.  If symptoms persist, we discussed starting doxycycline.  Patient will let me know how she is doing      Return precautions given.   Risks, benefits, and alternatives of the medications and treatment plan prescribed today were discussed, and patient expressed understanding.   Education regarding symptom management and diagnosis given to patient on AVS either electronically or printed.  No follow-ups on file.  Rennie Plowman, FNP  Subjective:    Patient ID: Melissa [name], female    DOB: [birth year], 68 y.o.   MRN: [id number]  CC: Melissa [name] is a 68 y.o. female who presents today for an acute visit.    HPI: Complains of cough x 3 days,unchanged.   Endorses nasal congestion , sinus pain, bilateral sore throat  She has an episode of chills last night.   She feels mucous in her chest that is not breaking up; cough worse in the night.   No fever, SOB, CP, wheezing  She has used Dayquil with some improvement.   No h/o lung disease  No abx in the last 3 months     History of hypertension, chronic kidney disease  Never smoker   Allergies: Terfenadine Current Outpatient Medications on File Prior to Visit  Medication Sig Dispense Refill   amLODipine (NORVASC) 2.5 MG tablet TAKE 1 TABLET BY MOUTH IN THE MORNING AND AT BEDTIME. CAN TAKE ADDITIONAL DOSE IF BP >130/>80 (Patient taking differently: Take 2.5 mg by mouth at bedtime.) 180 tablet 3   aspirin EC 81 MG tablet Take 81 mg by mouth daily.     atorvastatin (LIPITOR) 10 MG tablet Take 1 tablet (10 mg  total) by mouth daily at 6 PM. (Patient taking differently: Take 10 mg by mouth at bedtime.) 90 tablet 3   Calcium-Magnesium-Vitamin D (CALCIUM 1200+D3 PO) Take 1 tablet by mouth daily.     Crisaborole (EUCRISA) 2 % OINT Apply 1 Application topically daily as needed (eczema).     docusate sodium (COLACE) 50 MG capsule Take 1 capsule (50 mg total) by mouth daily as needed for mild constipation. (Patient taking differently: Take 50 mg by mouth daily.) 90 capsule 3   ferrous sulfate 325 (65 FE) MG tablet Take 325 mg by mouth daily.     fexofenadine-pseudoephedrine (ALLEGRA-D) 60-120 MG 12 hr tablet Take 1 tablet by mouth 2 (two) times daily. (Patient taking differently: Take 1 tablet by mouth daily.) 20 tablet 0   Multiple Vitamin (MULTI-VITAMINS) TABS Take 1 tablet by mouth daily.     oxybutynin (DITROPAN-XL) 10 MG 24 hr tablet TAKE 1 TABLET BY MOUTH EVERYDAY AT BEDTIME (Patient taking differently: Take 10 mg by mouth daily.) 90 tablet 3   telmisartan (MICARDIS) 40 MG tablet Take 40 mg by mouth daily.     dextromethorphan-guaiFENesin (MUCINEX DM) 30-600 MG 12hr tablet Take 1 tablet by mouth 2 (two) times daily as needed for cough.     senna-docusate (SENEXON-S) 8.6-50 MG tablet TAKE 1 TABLET BY MOUTH DAILY AS NEEDED FOR CONSTIPATION. 90 tablet 3  No current facility-administered medications on file prior to visit.    Review of Systems  Constitutional:  Positive for chills (one episode). Negative for fever.  HENT:  Positive for congestion, ear pain, sinus pressure and sore throat.   Respiratory:  Positive for cough. Negative for shortness of breath and wheezing.   Cardiovascular:  Negative for chest pain and palpitations.  Gastrointestinal:  Negative for nausea and vomiting.      Objective:    BP 128/76   Pulse 84   Temp 98 F (36.7 C) (Oral)   Ht 5\' 5"  (1.651 m)   Wt 173 lb 3.2 oz (78.6 kg)   SpO2 96%   BMI 28.82 kg/m   BP Readings from Last 3 Encounters:  06/23/23 128/76   01/06/23 132/80  12/03/22 128/82   Wt Readings from Last 3 Encounters:  06/23/23 173 lb (78.5 kg)  06/23/23 173 lb 3.2 oz (78.6 kg)  01/21/23 168 lb (76.2 kg)    Physical Exam Vitals reviewed.  Constitutional:      Appearance: She is well-developed.  HENT:     Head: Normocephalic and atraumatic.     Right Ear: Hearing, tympanic membrane, ear canal and external ear normal. No decreased hearing noted. No drainage, swelling or tenderness. No middle ear effusion. No foreign body. Tympanic membrane is not erythematous or bulging.     Left Ear: Hearing, tympanic membrane, ear canal and external ear normal. No decreased hearing noted. No drainage, swelling or tenderness.  No middle ear effusion. No foreign body. Tympanic membrane is not erythematous or bulging.     Nose: Nose normal. No rhinorrhea.     Right Sinus: No maxillary sinus tenderness or frontal sinus tenderness.     Left Sinus: No maxillary sinus tenderness or frontal sinus tenderness.     Mouth/Throat:     Pharynx: Uvula midline. Posterior oropharyngeal erythema present. No pharyngeal swelling, oropharyngeal exudate or uvula swelling.     Tonsils: No tonsillar abscesses.  Eyes:     Conjunctiva/sclera: Conjunctivae normal.  Cardiovascular:     Rate and Rhythm: Regular rhythm.     Pulses: Normal pulses.     Heart sounds: Normal heart sounds.  Pulmonary:     Effort: Pulmonary effort is normal.     Breath sounds: Normal breath sounds. No wheezing, rhonchi or rales.  Lymphadenopathy:     Head:     Right side of head: No submental, submandibular, tonsillar, preauricular, posterior auricular or occipital adenopathy.     Left side of head: No submental, submandibular, tonsillar, preauricular, posterior auricular or occipital adenopathy.     Cervical: No cervical adenopathy.  Skin:    General: Skin is warm and dry.  Neurological:     Mental Status: She is alert.  Psychiatric:        Speech: Speech normal.        Behavior:  Behavior normal.        Thought Content: Thought content normal.

## 2023-06-23 NOTE — Patient Instructions (Addendum)
 As discussed, I suspect more viral in etiology.    You start mucinex DM which includes an expectorant and a cough suppressant.  I think you would derive more benefit for Mucinex DM then DayQuil (which has decongestant component)  If symptoms change, persist, or worsen, please let me know so we can start doxycycline ( antibiotic) .  Nice meeting you!

## 2023-06-23 NOTE — Patient Instructions (Addendum)
 Your procedure is scheduled on: Tuesday, February 25 Report to the Registration Desk on the 1st floor of the [facility] To find out your arrival time, please call [phone] between 1PM - 3PM on: Monday, February 24 If your arrival time is 6:00 am, do not arrive before that time as the [facility] entrance doors do not open until 6:00 am.  REMEMBER: Instructions that are not followed completely may result in serious medical risk, up to and including death; or upon the discretion of your surgeon and anesthesiologist your surgery may need to be rescheduled.  Do not eat food after midnight the night before surgery.  No gum chewing or hard candies.  You may however, drink CLEAR liquids up to 2 hours before you are scheduled to arrive for your surgery. Do not drink anything within 2 hours of your scheduled arrival time.  Clear liquids include: - water  - apple juice without pulp - gatorade (not RED colors) - black coffee or tea (Do NOT add milk or creamers to the coffee or tea) Do NOT drink anything that is not on this list.  In addition, your doctor has ordered for you to drink the provided:  Ensure Pre-Surgery Clear Carbohydrate Drink  Drinking this carbohydrate drink up to two hours before surgery helps to reduce insulin resistance and improve patient outcomes. Please complete drinking 2 hours before scheduled arrival time.  One week prior to surgery: starting February 18 Stop aspirin and Anti-inflammatories (NSAIDS) such as Advil, Aleve, Ibuprofen, Motrin, Naproxen, Naprosyn and Aspirin based products such as Excedrin, Goody's Powder, BC Powder. Stop ANY OVER THE COUNTER supplements until after surgery. Stop calcium, magnesium, ferrous sulfate, multiple vitamins.  You may however, continue to take Tylenol if needed for pain up until the day of surgery.  Continue taking all of your other prescription medications up until the day of surgery.  ON THE DAY OF SURGERY ONLY TAKE THESE  MEDICATIONS WITH SIPS OF WATER:  oxybutynin  No Alcohol for 24 hours before or after surgery.  No Smoking including e-cigarettes for 24 hours before surgery.  No chewable tobacco products for at least 6 hours before surgery.  No nicotine patches on the day of surgery.  Do not use any "recreational" drugs for at least a week (preferably 2 weeks) before your surgery.  Please be advised that the combination of cocaine and anesthesia may have negative outcomes, up to and including death. If you test positive for cocaine, your surgery will be cancelled.  On the morning of surgery brush your teeth with toothpaste and water, you may rinse your mouth with mouthwash if you wish. Do not swallow any toothpaste or mouthwash.  Use CHG Soap as directed on instruction sheet.  Do not wear jewelry, make-up, hairpins, clips or nail polish.  For welded (permanent) jewelry: bracelets, anklets, waist bands, etc.  Please have this removed prior to surgery.  If it is not removed, there is a chance that hospital personnel will need to cut it off on the day of surgery.  Do not wear lotions, powders, or perfumes.   Do not shave body hair from the neck down 48 hours before surgery.  Contact lenses, hearing aids and dentures may not be worn into surgery.  Do not bring valuables to the hospital. [facility] is not responsible for any missing/lost belongings or valuables.   Notify your doctor if there is any change in your medical condition (cold, fever, infection).  Wear comfortable clothing (specific to your  surgery type) to the hospital.  After surgery, you can help prevent lung complications by doing breathing exercises.  Take deep breaths and cough every 1-2 hours. Your doctor may order a device called an Incentive Spirometer to help you take deep breaths.  If you are being discharged the day of surgery, you will not be allowed to drive home. You will need a responsible individual to drive you home and  stay with you for 24 hours after surgery.   If you are taking public transportation, you will need to have a responsible individual with you.  Please call the Pre-admissions Testing Dept. at [phone] if you have any questions about these instructions.  Surgery Visitation Policy:  Patients having surgery or a procedure may have two visitors.  Children under the age of 79 must have an adult with them who is not the patient.  Temporary Visitor Restrictions Due to increasing cases of flu, RSV and COVID-19: Children ages 22 and under will not be able to visit patients in [facility] hospitals under most circumstances.       Preparing for Surgery with CHLORHEXIDINE GLUCONATE (CHG) Soap  Chlorhexidine Gluconate (CHG) Soap  o An antiseptic cleaner that kills germs and bonds with the skin to continue killing germs even after washing  o Used for showering the night before surgery and morning of surgery  Before surgery, you can play an important role by reducing the number of germs on your skin.  CHG (Chlorhexidine gluconate) soap is an antiseptic cleanser which kills germs and bonds with the skin to continue killing germs even after washing.  Please do not use if you have an allergy to CHG or antibacterial soaps. If your skin becomes reddened/irritated stop using the CHG.  1. Shower the NIGHT BEFORE SURGERY and the MORNING OF SURGERY with CHG soap.  2. If you choose to wash your hair, wash your hair first as usual with your normal shampoo.  3. After shampooing, rinse your hair and body thoroughly to remove the shampoo.  4. Use CHG as you would any other liquid soap. You can apply CHG directly to the skin and wash gently with a scrungie or a clean washcloth.  5. Apply the CHG soap to your body only from the neck down. Do not use on open wounds or open sores. Avoid contact with your eyes, ears, mouth, and genitals (private parts). Wash face and genitals (private parts) with your  normal soap.  6. Wash thoroughly, paying special attention to the area where your surgery will be performed.  7. Thoroughly rinse your body with warm water.  8. Do not shower/wash with your normal soap after using and rinsing off the CHG soap.  9. Pat yourself dry with a clean towel.  10. Wear clean pajamas to bed the night before surgery.  12. Place clean sheets on your bed the night of your first shower and do not sleep with pets.  13. Shower again with the CHG soap on the day of surgery prior to arriving at the hospital.  14. Do not apply any deodorants/lotions/powders.  15. Please wear clean clothes to the hospital.

## 2023-06-23 NOTE — Assessment & Plan Note (Signed)
 Afebrile.  No acute respiratory distress.  Discussed more likely viral etiology with duration 3 days.  Negative COVID, flu, strep point-of-care.  Discussed conservative management with Mucinex DM.  Advised more likely to derive more benefit from Mucinex DM then DayQuil.  If symptoms persist, we discussed starting doxycycline.  Patient will let me know how she is doing

## 2023-06-30 ENCOUNTER — Other Ambulatory Visit: Payer: Self-pay

## 2023-06-30 ENCOUNTER — Ambulatory Visit: Payer: Medicare HMO

## 2023-06-30 ENCOUNTER — Encounter: Payer: Self-pay | Admitting: Orthopedic Surgery

## 2023-06-30 ENCOUNTER — Encounter
Admission: RE | Disposition: A | Discharge status: Home / Self Care | Payer: Self-pay | Source: Home / Self Care | Attending: Orthopedic Surgery

## 2023-06-30 ENCOUNTER — Ambulatory Visit
Admission: RE | Admit: 2023-06-30 | Discharge: 2023-06-30 | Disposition: A | Discharge status: Home / Self Care | Payer: Medicare HMO | Attending: Orthopedic Surgery | Admitting: Orthopedic Surgery

## 2023-06-30 DIAGNOSIS — Z79899 Other long term (current) drug therapy: Secondary | ICD-10-CM | POA: Insufficient documentation

## 2023-06-30 DIAGNOSIS — S76092A Other specified injury of muscle, fascia and tendon of left hip, initial encounter: Secondary | ICD-10-CM | POA: Diagnosis not present

## 2023-06-30 DIAGNOSIS — M7062 Trochanteric bursitis, left hip: Secondary | ICD-10-CM | POA: Diagnosis present

## 2023-06-30 DIAGNOSIS — X58XXXA Exposure to other specified factors, initial encounter: Secondary | ICD-10-CM | POA: Insufficient documentation

## 2023-06-30 DIAGNOSIS — D631 Anemia in chronic kidney disease: Secondary | ICD-10-CM | POA: Insufficient documentation

## 2023-06-30 DIAGNOSIS — M7632 Iliotibial band syndrome, left leg: Secondary | ICD-10-CM | POA: Insufficient documentation

## 2023-06-30 DIAGNOSIS — K219 Gastro-esophageal reflux disease without esophagitis: Secondary | ICD-10-CM | POA: Diagnosis not present

## 2023-06-30 DIAGNOSIS — N183 Chronic kidney disease, stage 3 unspecified: Secondary | ICD-10-CM | POA: Insufficient documentation

## 2023-06-30 DIAGNOSIS — N1832 Chronic kidney disease, stage 3b: Secondary | ICD-10-CM | POA: Diagnosis not present

## 2023-06-30 DIAGNOSIS — I129 Hypertensive chronic kidney disease with stage 1 through stage 4 chronic kidney disease, or unspecified chronic kidney disease: Secondary | ICD-10-CM | POA: Diagnosis not present

## 2023-06-30 DIAGNOSIS — M778 Other enthesopathies, not elsewhere classified: Secondary | ICD-10-CM | POA: Diagnosis not present

## 2023-06-30 SURGERY — ARTHROSCOPIC TROCHANTERIC BURSECTOMY
Anesthesia: General | Site: Hip | Laterality: Left

## 2023-06-30 MED ORDER — SUGAMMADEX SODIUM 200 MG/2ML IV SOLN
INTRAVENOUS | Status: AC
  Filled 2023-06-30: qty 2

## 2023-06-30 MED ORDER — SUGAMMADEX SODIUM 200 MG/2ML IV SOLN
INTRAVENOUS | Status: DC | PRN
  Administered 2023-06-30 (×2): 50 mg via INTRAVENOUS

## 2023-06-30 MED ORDER — CHLORHEXIDINE GLUCONATE 0.12 % MT SOLN
15.0000 mL | Freq: Once | OROMUCOSAL | Status: AC
  Administered 2023-06-30: 15 mL via OROMUCOSAL

## 2023-06-30 MED ORDER — PROPOFOL 10 MG/ML IV BOLUS
INTRAVENOUS | Status: DC | PRN
  Administered 2023-06-30: 170 ug/kg/min via INTRAVENOUS
  Administered 2023-06-30: 140 ug/kg/min via INTRAVENOUS
  Administered 2023-06-30: 150 ug/kg/min via INTRAVENOUS

## 2023-06-30 MED ORDER — OXYCODONE HCL 5 MG PO TABS
5.0000 mg | ORAL_TABLET | ORAL | 0 refills | Status: DC | PRN
Start: 2023-06-30 — End: 2024-01-08

## 2023-06-30 MED ORDER — CEFAZOLIN SODIUM-DEXTROSE 2-4 GM/100ML-% IV SOLN
INTRAVENOUS | Status: AC
  Filled 2023-06-30: qty 100

## 2023-06-30 MED ORDER — ROCURONIUM BROMIDE 10 MG/ML (PF) SYRINGE
PREFILLED_SYRINGE | INTRAVENOUS | Status: AC
  Filled 2023-06-30: qty 10

## 2023-06-30 MED ORDER — ROCURONIUM BROMIDE 100 MG/10ML IV SOLN
INTRAVENOUS | Status: DC | PRN
  Administered 2023-06-30: 50 mg via INTRAVENOUS

## 2023-06-30 MED ORDER — ORAL CARE MOUTH RINSE: 15.0000 mL | Freq: Once | OROMUCOSAL | Status: AC

## 2023-06-30 MED ORDER — OXYCODONE HCL 5 MG PO TABS
ORAL_TABLET | ORAL | Status: AC
  Filled 2023-06-30: qty 1

## 2023-06-30 MED ORDER — ONDANSETRON HCL 4 MG/2ML IJ SOLN: 4.0000 mg | Freq: Once | INTRAMUSCULAR | Status: DC | PRN

## 2023-06-30 MED ORDER — ONDANSETRON 4 MG PO TBDP: 4.0000 mg | ORAL_TABLET | Freq: Three times a day (TID) | ORAL | 0 refills | Status: DC | PRN

## 2023-06-30 MED ORDER — ACETAMINOPHEN 10 MG/ML IV SOLN: 1000.0000 mg | Freq: Once | INTRAVENOUS | Status: DC | PRN

## 2023-06-30 MED ORDER — ONDANSETRON HCL 4 MG/2ML IJ SOLN
INTRAMUSCULAR | Status: DC | PRN
  Administered 2023-06-30: 4 mg via INTRAVENOUS

## 2023-06-30 MED ORDER — LIDOCAINE HCL (CARDIAC) PF 100 MG/5ML IV SOSY
PREFILLED_SYRINGE | INTRAVENOUS | Status: DC | PRN
  Administered 2023-06-30: 100 mg via INTRAVENOUS

## 2023-06-30 MED ORDER — BUPIVACAINE LIPOSOME 1.3 % IJ SUSP
INTRAMUSCULAR | Status: DC | PRN
  Administered 2023-06-30: 40 mL via SURGICAL_CAVITY

## 2023-06-30 MED ORDER — FENTANYL CITRATE (PF) 100 MCG/2ML IJ SOLN: 25.0000 ug | INTRAMUSCULAR | Status: DC | PRN

## 2023-06-30 MED ORDER — MIDAZOLAM HCL 2 MG/2ML IJ SOLN
INTRAMUSCULAR | Status: AC
  Filled 2023-06-30: qty 2

## 2023-06-30 MED ORDER — LIDOCAINE HCL (PF) 2 % IJ SOLN
INTRAMUSCULAR | Status: AC
  Filled 2023-06-30: qty 5

## 2023-06-30 MED ORDER — MIDAZOLAM HCL 2 MG/2ML IJ SOLN
INTRAMUSCULAR | Status: DC | PRN
  Administered 2023-06-30: 2 mg via INTRAVENOUS

## 2023-06-30 MED ORDER — CHLORHEXIDINE GLUCONATE 0.12 % MT SOLN
OROMUCOSAL | Status: AC
  Filled 2023-06-30: qty 15

## 2023-06-30 MED ORDER — PROPOFOL 1000 MG/100ML IV EMUL
INTRAVENOUS | Status: AC
  Filled 2023-06-30: qty 100

## 2023-06-30 MED ORDER — ACETAMINOPHEN 10 MG/ML IV SOLN
INTRAVENOUS | Status: DC | PRN
  Administered 2023-06-30: 1000 mg via INTRAVENOUS

## 2023-06-30 MED ORDER — PROPOFOL 10 MG/ML IV BOLUS
INTRAVENOUS | Status: AC
  Filled 2023-06-30: qty 20

## 2023-06-30 MED ORDER — CEFAZOLIN SODIUM-DEXTROSE 2-4 GM/100ML-% IV SOLN
2.0000 g | INTRAVENOUS | Status: AC
  Administered 2023-06-30: 2 g via INTRAVENOUS

## 2023-06-30 MED ORDER — ONDANSETRON HCL 4 MG/2ML IJ SOLN
INTRAMUSCULAR | Status: AC
  Filled 2023-06-30: qty 2

## 2023-06-30 MED ORDER — BUPIVACAINE HCL (PF) 0.5 % IJ SOLN
INTRAMUSCULAR | Status: AC
  Filled 2023-06-30: qty 30

## 2023-06-30 MED ORDER — OXYCODONE HCL 5 MG/5ML PO SOLN: 5.0000 mg | Freq: Once | ORAL | Status: AC | PRN

## 2023-06-30 MED ORDER — FENTANYL CITRATE (PF) 100 MCG/2ML IJ SOLN
INTRAMUSCULAR | Status: DC | PRN
  Administered 2023-06-30: 100 ug via INTRAVENOUS

## 2023-06-30 MED ORDER — DEXAMETHASONE SODIUM PHOSPHATE 10 MG/ML IJ SOLN
INTRAMUSCULAR | Status: DC | PRN
  Administered 2023-06-30: 10 mg via INTRAVENOUS

## 2023-06-30 MED ORDER — DEXAMETHASONE SODIUM PHOSPHATE 10 MG/ML IJ SOLN
INTRAMUSCULAR | Status: AC
  Filled 2023-06-30: qty 1

## 2023-06-30 MED ORDER — OXYCODONE HCL 5 MG PO TABS
5.0000 mg | ORAL_TABLET | Freq: Once | ORAL | Status: AC | PRN
  Administered 2023-06-30: 5 mg via ORAL

## 2023-06-30 MED ORDER — ACETAMINOPHEN 10 MG/ML IV SOLN
INTRAVENOUS | Status: AC
  Filled 2023-06-30: qty 100

## 2023-06-30 MED ORDER — BUPIVACAINE LIPOSOME 1.3 % IJ SUSP
INTRAMUSCULAR | Status: AC
  Filled 2023-06-30: qty 20

## 2023-06-30 MED ORDER — FENTANYL CITRATE (PF) 100 MCG/2ML IJ SOLN
INTRAMUSCULAR | Status: AC
  Filled 2023-06-30: qty 2

## 2023-06-30 MED ORDER — LACTATED RINGERS IR SOLN
Status: DC | PRN
  Administered 2023-06-30: 7000 mL
  Administered 2023-06-30: 18000 mL

## 2023-06-30 MED ORDER — ASPIRIN 325 MG PO TBEC: 325.0000 mg | DELAYED_RELEASE_TABLET | Freq: Every day | ORAL | 0 refills | Status: AC

## 2023-06-30 MED ORDER — ACETAMINOPHEN 500 MG PO TABS
1000.0000 mg | ORAL_TABLET | Freq: Three times a day (TID) | ORAL | 2 refills | Status: AC
Start: 2023-06-30 — End: 2024-06-29

## 2023-06-30 MED ORDER — GLYCOPYRROLATE 0.2 MG/ML IJ SOLN
INTRAMUSCULAR | Status: DC | PRN
  Administered 2023-06-30: .2 mg via INTRAVENOUS

## 2023-06-30 MED ORDER — LACTATED RINGERS IV SOLN: INTRAVENOUS | Status: DC

## 2023-06-30 MED ORDER — PROPOFOL 10 MG/ML IV BOLUS
INTRAVENOUS | Status: AC
  Filled 2023-06-30: qty 40

## 2023-06-30 SURGICAL SUPPLY — 46 items
ADAPTER IRRIG TUBE 2 SPIKE SOL (ADAPTER) ×2 IMPLANT
ANCHOR SUT FBRTK 2.6 SOFT 1.7 (Anchor) IMPLANT
ANCHOR SWIVELOCK BIO 4.75X19.1 (Anchor) IMPLANT
BLADE FULL RADIUS 3.5 (BLADE) IMPLANT
BLADE SHAVER 4.5X7 STR FR (MISCELLANEOUS) IMPLANT
BNDG ADH 1X3 SHEER STRL LF (GAUZE/BANDAGES/DRESSINGS) ×6 IMPLANT
BUR BR 5.5 WIDE MOUTH (BURR) IMPLANT
CANNULA TWIST IN 8.25X7CM (CANNULA) ×1 IMPLANT
CHLORAPREP W/TINT 26 (MISCELLANEOUS) ×1 IMPLANT
COOLER POLAR GLACIER W/PUMP (MISCELLANEOUS) IMPLANT
DRAPE C-ARM XRAY 36X54 (DRAPES) ×1 IMPLANT
DRAPE U-SHAPE 47X51 STRL (DRAPES) ×2 IMPLANT
GAUZE SPONGE 4X4 12PLY STRL (GAUZE/BANDAGES/DRESSINGS) ×1 IMPLANT
GAUZE XEROFORM 1X8 LF (GAUZE/BANDAGES/DRESSINGS) ×1 IMPLANT
GLOVE BIO SURGEON STRL SZ7.5 (GLOVE) ×2 IMPLANT
GLOVE BIO SURGEON STRL SZ8 (GLOVE) ×2 IMPLANT
GLOVE BIOGEL PI IND STRL 8 (GLOVE) ×1 IMPLANT
GLOVE INDICATOR 8.0 STRL GRN (GLOVE) ×1 IMPLANT
GLOVE SURG ORTHO 8.0 STRL STRW (GLOVE) ×1 IMPLANT
GLOVE SURG SYN 7.5 E (GLOVE) ×1 IMPLANT
GLOVE SURG SYN 7.5 PF PI (GLOVE) ×1 IMPLANT
GOWN STRL REUS W/ TWL LRG LVL3 (GOWN DISPOSABLE) ×1 IMPLANT
GOWN STRL REUS W/ TWL XL LVL3 (GOWN DISPOSABLE) ×1 IMPLANT
IV NS IRRIG 3000ML ARTHROMATIC (IV SOLUTION) ×4 IMPLANT
KIT ANCHOR FBRTK 2.6 STR (KITS) IMPLANT
KIT TURNOVER KIT A (KITS) ×1 IMPLANT
MANIFOLD NEPTUNE II (INSTRUMENTS) ×1 IMPLANT
MAT ABSORB FLUID 56X50 GRAY (MISCELLANEOUS) ×1 IMPLANT
NDL HYPO 22X1.5 SAFETY MO (MISCELLANEOUS) ×1 IMPLANT
NEEDLE HYPO 22X1.5 SAFETY MO (MISCELLANEOUS) ×1 IMPLANT
PACK ARTHROSCOPY SHOULDER (MISCELLANEOUS) ×1 IMPLANT
PAD ABD DERMACEA PRESS 5X9 (GAUZE/BANDAGES/DRESSINGS) ×1 IMPLANT
PAD WRAPON POLOR MULTI XL (MISCELLANEOUS) IMPLANT
PASSER SUT FIRSTPASS SELF (INSTRUMENTS) IMPLANT
SPONGE T-LAP 18X18 ~~LOC~~+RFID (SPONGE) ×1 IMPLANT
SUT ETHILON 3-0 FS-10 30 BLK (SUTURE) ×1 IMPLANT
SUT VIC AB 2-0 CT2 27 (SUTURE) ×1 IMPLANT
SUTURE EHLN 3-0 FS-10 30 BLK (SUTURE) ×1 IMPLANT
SYR 10ML LL (SYRINGE) ×1 IMPLANT
SYR 20ML LL LF (SYRINGE) ×1 IMPLANT
TRAP FLUID SMOKE EVACUATOR (MISCELLANEOUS) ×1 IMPLANT
TUBE SET DOUBLEFLO INFLOW (TUBING) ×1 IMPLANT
TUBE SET DOUBLEFLO OUTFLOW (TUBING) ×1 IMPLANT
WAND 4.6 50D HIPVAC 50 IFS (SURGICAL WAND) ×1 IMPLANT
WATER STERILE IRR 500ML POUR (IV SOLUTION) ×1 IMPLANT
WRAP-ON POLOR PAD MULTI XL (MISCELLANEOUS) ×1 IMPLANT

## 2023-06-30 NOTE — Anesthesia Preprocedure Evaluation (Addendum)
 Anesthesia Evaluation  Patient identified by MRN, date of birth, ID band Patient awake    Reviewed: Allergy & Precautions, NPO status , Patient's Chart, lab work & pertinent test results  History of Anesthesia Complications Negative for: history of anesthetic complications  Airway Mallampati: II   Neck ROM: Full    Dental   Crowns :   Pulmonary neg pulmonary ROS   Pulmonary exam normal breath sounds clear to auscultation       Cardiovascular hypertension, Normal cardiovascular exam Rhythm:Regular Rate:Normal  ECG 06/23/23: normal   Neuro/Psych negative neurological ROS     GI/Hepatic ,GERD  ,,  Endo/Other  negative endocrine ROS    Renal/GU Renal disease (stage III CKD)     Musculoskeletal   Abdominal   Peds  Hematology  (+) Blood dyscrasia, anemia   Anesthesia Other Findings   Reproductive/Obstetrics                             Anesthesia Physical Anesthesia Plan  ASA: 2  Anesthesia Plan: General   Post-op Pain Management:    Induction: Intravenous  PONV Risk Score and Plan: 3 and Ondansetron, Dexamethasone and Treatment may vary due to age or medical condition  Airway Management Planned: Oral ETT  Additional Equipment:   Intra-op Plan:   Post-operative Plan: Extubation in OR  Informed Consent: I have reviewed the patients History and Physical, chart, labs and discussed the procedure including the risks, benefits and alternatives for the proposed anesthesia with the patient or authorized representative who has indicated his/her understanding and acceptance.     Dental advisory given  Plan Discussed with: CRNA  Anesthesia Plan Comments: (Patient consented for risks of anesthesia including but not limited to:  - adverse reactions to medications - damage to eyes, teeth, lips or other oral mucosa - nerve damage due to positioning  - sore throat or hoarseness - damage  to heart, brain, nerves, lungs, other parts of body or loss of life  Informed patient about role of CRNA in peri- and intra-operative care.  Patient voiced understanding.)        Anesthesia Quick Evaluation

## 2023-06-30 NOTE — H&P (Signed)
 Paper H&P to be scanned into permanent record. H&P reviewed. No significant changes noted.

## 2023-06-30 NOTE — Anesthesia Postprocedure Evaluation (Signed)
 Anesthesia Post Note  Patient: SHYNICE SIGEL  Procedure(s) Performed: Left hip endoscopic gluteus medius/minimus repair, trochanteric bursectomy (Left: Hip) IT band release (Left: Hip)  Patient location during evaluation: PACU Anesthesia Type: General Level of consciousness: awake and alert, oriented and patient cooperative Pain management: pain level controlled Vital Signs Assessment: post-procedure vital signs reviewed and stable Respiratory status: spontaneous breathing, nonlabored ventilation and respiratory function stable Cardiovascular status: blood pressure returned to baseline and stable Postop Assessment: adequate PO intake Anesthetic complications: no   There were no known notable events for this encounter.   Last Vitals:  Vitals:   06/30/23 1645 06/30/23 1700  BP:  (!) 172/85  Pulse: 82 78  Resp: (!) 22 20  Temp:  37.1 C  SpO2: 99% 97%    Last Pain:  Vitals:   06/30/23 1700  TempSrc:   PainSc: 3                  Reed Breech

## 2023-06-30 NOTE — Transfer of Care (Signed)
 Immediate Anesthesia Transfer of Care Note  Patient: Melissa [name]  Procedure(s) Performed: Left hip endoscopic gluteus medius/minimus repair, trochanteric bursectomy (Left: Hip) IT band release (Left: Hip)  Patient Location: PACU  Anesthesia Type:General  Level of Consciousness: awake  Airway & Oxygen Therapy: Patient Spontanous Breathing and Patient connected to face mask  Post-op Assessment: Report given to RN and Post -op Vital signs reviewed and stable  Post vital signs: Reviewed and stable  Last Vitals:  Vitals Value Taken Time  BP 160/98 06/30/23 1606  Temp    Pulse 83 06/30/23 1609  Resp 19 06/30/23 1609  SpO2 100 % 06/30/23 1609  Vitals shown include unfiled device data.  Last Pain:  Vitals:   06/30/23 1200  TempSrc: Oral         Complications: There were no known notable events for this encounter.

## 2023-06-30 NOTE — Anesthesia Procedure Notes (Signed)
 Procedure Name: Intubation Date/Time: 06/30/2023 1:32 PM  Performed by: Lysbeth Penner, CRNAPre-anesthesia Checklist: Patient identified, Emergency Drugs available, Suction available and Patient being monitored Patient Re-evaluated:Patient Re-evaluated prior to induction Oxygen Delivery Method: Circle system utilized Preoxygenation: Pre-oxygenation with 100% oxygen Induction Type: IV induction Ventilation: Mask ventilation without difficulty Laryngoscope Size: Glidescope and 3 Grade View: Grade I Tube type: Oral Tube size: 6.5 mm Number of attempts: 1 Airway Equipment and Method: Stylet and Oral airway Placement Confirmation: ETT inserted through vocal cords under direct vision, positive ETCO2 and breath sounds checked- equal and bilateral Secured at: 19 cm Tube secured with: Tape Dental Injury: Teeth and Oropharynx as per pre-operative assessment

## 2023-06-30 NOTE — Op Note (Signed)
 DATE OF SURGERY:  06/30/2023    PREOPERATIVE DIAGNOSIS:  1. Left hip gluteus medius and minimus tear 2. Left hip trochanteric bursitis 3. Left hip iliotibial band tightness   POSTOPERATIVE DIAGNOSIS:  1. Left hip gluteus medius and minimus tear 2. Left hip trochanteric bursitis 3. Left hip iliotibial band tightness   PROCEDURE:  1. Left hip endoscopic gluteus medius and minimus repair  2. Left hip endoscopic trochanteric bursectomy 3. Left hip endoscopic iliotibial band release  SURGEON: Rosealee Albee, MD  ASSISTANT: Sonny Dandy, PA   EBL: 5cc  ANESTHESIA: Gen  IMPLANTS: - Arthrex Knotless Fibertak 2.5mm anchor x 1 - Arthrex 4.17mm SwiveLock x 1   INDICATION(S): The patient is a 68 y.o. female who presents with persistent lateral sided hip pain. The MRI revealed significant trochanteric bursitis with gluteus medius/minimus tendon tear.  The patient has failed all non-operative care to date including multiple corticosteroid injections, physical therapy/exercises, medications, and activity modification.  Please see the preoperative notes for further detail.   The patient elected to undergo the above mentioned procedure after detailed explanation of the expected outcomes and recovery path and after discussion of risks, benefits, and alternatives to surgery  OPERATIVE FINDINGS: full thickness gluteus medius & minimus tear, significant trochanteric bursitis.  OPERATIVE REPORT:   The patient was brought to the operating room and underwent anesthesia. The patient was placed in a supine fashion on the Hana table.  The operative extremity was flexed approximately 10 degrees and abducted approximately 20 degrees.  The foot was internally rotated. All bony prominences were padded.  Appropriate IV antibiotics were administered.  The patient was prepped and draped in a sterile fashion.  Time-out was performed and landmarks were identified with fluoroscopic assistance.  Needle localization  with fluoroscopy was used to make a distal anterolateral portal just distal to the level of the vastus ridge.  The blunt trocar was inserted deep to the IT band and along the greater trochanter.  The peritrochanteric space was opened with a blunt trocar.  Appropriate positioning was confirmed with fluoroscopy.  A 70 degree knee arthroscope was used for this procedure, and it was inserted.  Next a standard anterolateral portal was established just anterior to the IT band and at the level of the proximal greater trochanter. This was also done under needle localization to ensure appropriate trajectory.  A switching stick was inserted and appropriate positioning was confirmed with fluoroscopy.  A cannula was placed over the switching stick.  A shaver was introduced.  Using combination of oscillating shaver and electrocautery wand, the significant amount of trochanteric bursa was excised.  After excision and debridement of the bursa, the gluteal sling, vastus lateralis, and gluteus medius/minimus insertion were well visualized.  There was a full-thickness gluteus medius and minimus tear present. Degenerative fibers were debrided with an oscillating shaver. The footprint was prepared with an Arthrocare wand and burr to creating a bleeding bed. An accessory proximal posterior portal was made under spinal needle localization just proximal to the posterior aspect of the greater trochanter.  An Arthrex 2.6 mm knotless FiberTak anchor was placed through this portal at the proximal aspect of the tear. Sutures were passed through the gluteus medius and minimus tendons using a FirstPass suture passer through the posterior and DALA portals. All four strands of suture were loaded onto an Arthrex 4.60mm SwiveLock anchor for lateral row fixation. Sutures were tensioned appropriately and this was placed along the posterior aspect of the greater trochanter just proximal to  the vastus ridge. This construct allowed for excellent  compression of the gluteus medius/minimus tendon onto its footprint.  Sutures were cut. Repair was stable to internal/external rotation and slight adduction/abduction.   Next, the leg was then placed in a neutral position.  The most prominent portion of the greater trochanter was adjacent to the IT band.  The IT band was then released via a cruciate type incision using an ArthroCare wand to reduce friction and irritation in this region of prominence.  Arthroscopic fluid was then evacuated from the joint.    Local anesthetic was injected.  Portal sites were closed with 2-0 Vicryl and 3-0 nylon sutures.  Sterile dressing was applied.  The patient was awakened from anesthesia without difficulty and transferred to PACU in stable condition.   Of note, assistance from a PA was essential to performing the surgery.  PA was present for the entire surgery.  PA assisted with patient positioning, retraction, instrumentation, and wound closure. The surgery would have been more difficult and had longer operative time without PA assistance.    POSTOPERATIVE PLAN:  Patient to be discharged home from PACU. FFWB x 6 weeks, and then make progress towards weightbearing as tolerated. ASA 325 mg daily x4 weeks for DVT prophylaxis.  Follow-up with in approximately 2 weeks for postoperative visit.

## 2023-06-30 NOTE — Discharge Instructions (Signed)
 Gluteus Medius Repair Post-Operative Instructions   1. Plan for 1st Physical Therapy visit in ~3-4 days. FLAT FOOT WEIGHT BEARING (weight of leg only) for 6 weeks. 2. You may notice blood-tinged drainage a few days after surgery. This is the muscles releasing fluid that was used during surgery. You may change to a dry dressing as needed if you see this. If oozing from surgery site occurs after 5-7 days postoperative, please contact Dr. Eliane Decree offices. 3. Icing is very important for the first 5-7 days postoperative, and ice is applied (ice packs or ice therapy) as often as possible or at least for 20-minute periods 3-4 times per day. Ice should not be applied directly on the skin. 4. You may remove the dressing at the time of PT. 5. Showering is allowed after dressing is removed if no drainage..  6.  Do not soak the hip in water in a bathtub or pool for ~4 weeks.  7. Driving is permitted after ~2 weeks if the narcotic pain medication is no longer being taken and you feel comfortable getting into and out of a car. Driving a manual car may take up to 4 weeks. 8. The anesthetic drugs used during your surgery may cause nausea for the first 24 hours. If nausea is encountered, drink only clear liquids (i.e. Sprite or 7-up). The only solids should be dry crackers or toast. If nausea and vomiting become severe or the patient shows sign of dehydration (lack of urination) please call the doctor or the surgicenter. 9. If you develop a fever (101.5), redness, or yellow/brown/green drainage from the surgical incision site, please call our office to arrange for an evaluation. 10. Enclosed are prescriptions for you to use post-operatively. 11. You will take aspirin (325 mg) daily for 4 weeks. This may lower the risk of a blood clot developing after surgery. Should severe calf pain occur or significant swelling of calf and ankle, please call the doctor. 12. Local anesthetics (i.e. Novocaine) are put into the  incision after surgery. It is not uncommon for patients to encounter more pain on the first or second day after surgery. This is the time when swelling peaks. Taking pain medication before bedtime will assist in sleeping. It is important not to drink or drive while taking narcotic medication.  You should resume your normal medications for other conditions the day after surgery. 13. Follow weight bearing instructions (Flat foot weight bearing) as advised at discharge. Crutches/walker may be necessary to assist walking.  14. If unexpected problems occur and you need to speak to the doctor, call the office.  ACTIVITY RESTRICTIONS AND BRACING: -Brace should be worn at night while sleeping. Ideally, it should be locked in extension (0 degrees) with you sleeping on your back. Push orange tab down to lock in extension. If it will not push down, then the brace is probably not completely extended and may still be a bit bent.  -When up and ambulating, your brace can be unlocked from 0-90 degrees while following your touch-down weightbearing restrictions with crutches -Avoid sitting with your hips flexed 90 degrees for more than 30 minutes at a time -Avoid excessive external rotation or the "Figure-4" position with your leg crossed -We will updated your restrictions at your first postoperative visit   PAIN MEDICATION:  Tylenol 1000mg  3x/day until no pain 2.   Oxycodone 1 to 2 tablets by mouth every 4 hours as needed.   3.   Do NOT take anti-inflammatory medications as they can interfere with  healing. These include ibuprofen (Advil, Motrin), naproxen (Aleve), meloxicam (Mobic), etodolac, diclofenac, etc.  BLOOD CLOT PREVENTION: Aspirin 325mg  by mouth daily for 4 weeks  ANTI-NAUSEA (if applicable): Zofran 4mg  tabs, 1 tablet by mouth every 6 hours as needed. *You will be given a prescription, but it is optional to fill it.*  STOOL SOFTENER: Docusate 100mg  twice daily while taking pain  medication   Important Contact Information [phone] (Office phone number)   Post-operative Brace: Apply and remove the brace you received as you were instructed to at the time of fitting and as described in detail as the brace's instructions for use indicate.  Wear the brace for the period of time prescribed by your physician.  The brace can be cleaned with soap and water and allowed to air dry only.  Should the brace result in increased pain, decreased feeling (numbness/tingling), increased swelling or an overall worsening of your medical condition, please contact your doctor immediately.  If an emergency situation occurs as a result of wearing the brace after normal business hours, please dial 911 and seek immediate medical attention.  Let your doctor know if you have any further questions about the brace issued to you. Refer to the T-Scope Hip instructions for use if you have any questions regarding your hip brace.  You can also Google: "breg T-scope hip abduction brace" and find videos showing how to use the brace.  Wrap brace around waist and attach with velcro.  Pull back support with thumb loop and adhere to waist belt.  3.   Clip on thigh strap and  tighten with strapping on front of thigh as needed.   [facility] Customer Care for Troubleshooting: [phone]      POLAR CARE INFORMATION  MassAdvertisement.it  How to use Breg Polar Care [facility] Therapy System?  YouTube   [email or web address].co.uk  OPERATING INSTRUCTIONS  Start the product With dry hands, connect the transformer to the electrical connection located on the top of the cooler. Next, plug the transformer into an appropriate electrical outlet. The unit will automatically start running at this point.  To stop the pump, disconnect electrical power.  Unplug to stop the product when not in use. Unplugging the Polar Care unit turns it off. Always unplug immediately after use. Never leave it plugged in  while unattended. Remove pad.    FIRST ADD WATER TO FILL LINE, THEN ICE---Replace ice when existing ice is almost melted  1 Discuss Treatment with your Licensed Health Care Practitioner and Use Only as Prescribed 2 Apply Insulation Barrier & Cold Therapy Pad 3 Check for Moisture 4 Inspect Skin Regularly  Tips and Trouble Shooting Usage Tips 1. Use cubed or chunked ice for optimal performance. 2. It is recommended to drain the Pad between uses. To drain the pad, hold the Pad upright with the hose pointed toward the ground. Depress the black plunger and allow water to drain out. 3. You may disconnect the Pad from the unit without removing the pad from the affected area by depressing the silver tabs on the hose coupling and gently pulling the hoses apart. The Pad and unit will seal itself and will not leak. Note: Some dripping during release is normal. 4. DO NOT RUN PUMP WITHOUT WATER! The pump in this unit is designed to run with water. Running the unit without water will cause permanent damage to the pump. 5. Unplug unit before removing lid.  TROUBLESHOOTING GUIDE Pump not running, Water not flowing to the pad, Pad is not getting  cold 1. Make sure the transformer is plugged into the wall outlet. 2. Confirm that the ice and water are filled to the indicated levels. 3. Make sure there are no kinks in the pad. 4. Gently pull on the blue tube to make sure the tube/pad junction is straight. 5. Remove the pad from the treatment site and ll it while the pad is lying at; then reapply. 6. Confirm that the pad couplings are securely attached to the unit. Listen for the double clicks (Figure 1) to confirm the pad couplings are securely attached.  Leaks    Note: Some condensation on the lines, controller, and pads is unavoidable, especially in warmer climates. 1. If using a Breg Polar Care Cold Therapy unit with a detachable Cold Therapy Pad, and a leak exists (other than condensation on the lines)  disconnect the pad couplings. Make sure the silver tabs on the couplings are depressed before reconnecting the pad to the pump hose; then confirm both sides of the coupling are properly clicked in. 2. If the coupling continues to leak or a leak is detected in the pad itself, stop using it and call B[employer] Customer Care at [phone].  Cleaning After use, empty and dry the unit with a soft cloth. Warm water and mild detergent may be used occasionally to clean the pump and tubes.  WARNING: The Polar Care Cube can be cold enough to cause serious injury, including full skin necrosis. Follow these Operating Instructions, and carefully read the Product Insert (see pouch on side of unit) and the Cold Therapy Pad Fitting Instructions (provided with each Cold Therapy Pad) prior to use.

## 2023-07-06 DIAGNOSIS — S76012D Strain of muscle, fascia and tendon of left hip, subsequent encounter: Secondary | ICD-10-CM | POA: Diagnosis not present

## 2023-07-14 DIAGNOSIS — S76012D Strain of muscle, fascia and tendon of left hip, subsequent encounter: Secondary | ICD-10-CM | POA: Diagnosis not present

## 2023-07-14 DIAGNOSIS — M25552 Pain in left hip: Secondary | ICD-10-CM | POA: Diagnosis not present

## 2023-07-14 DIAGNOSIS — G8929 Other chronic pain: Secondary | ICD-10-CM | POA: Diagnosis not present

## 2023-07-14 DIAGNOSIS — M25652 Stiffness of left hip, not elsewhere classified: Secondary | ICD-10-CM | POA: Diagnosis not present

## 2023-07-14 DIAGNOSIS — M6281 Muscle weakness (generalized): Secondary | ICD-10-CM | POA: Diagnosis not present

## 2023-07-21 DIAGNOSIS — M25552 Pain in left hip: Secondary | ICD-10-CM | POA: Diagnosis not present

## 2023-07-21 DIAGNOSIS — G8929 Other chronic pain: Secondary | ICD-10-CM | POA: Diagnosis not present

## 2023-07-28 DIAGNOSIS — G8929 Other chronic pain: Secondary | ICD-10-CM | POA: Diagnosis not present

## 2023-07-28 DIAGNOSIS — M25552 Pain in left hip: Secondary | ICD-10-CM | POA: Diagnosis not present

## 2023-08-03 DIAGNOSIS — M25552 Pain in left hip: Secondary | ICD-10-CM | POA: Diagnosis not present

## 2023-08-03 DIAGNOSIS — S76012D Strain of muscle, fascia and tendon of left hip, subsequent encounter: Secondary | ICD-10-CM | POA: Diagnosis not present

## 2023-08-03 DIAGNOSIS — G8929 Other chronic pain: Secondary | ICD-10-CM | POA: Diagnosis not present

## 2023-08-03 DIAGNOSIS — M25652 Stiffness of left hip, not elsewhere classified: Secondary | ICD-10-CM | POA: Diagnosis not present

## 2023-08-03 DIAGNOSIS — M6281 Muscle weakness (generalized): Secondary | ICD-10-CM | POA: Diagnosis not present

## 2023-08-13 DIAGNOSIS — S76012D Strain of muscle, fascia and tendon of left hip, subsequent encounter: Secondary | ICD-10-CM | POA: Diagnosis not present

## 2023-08-13 DIAGNOSIS — M6281 Muscle weakness (generalized): Secondary | ICD-10-CM | POA: Diagnosis not present

## 2023-08-13 DIAGNOSIS — M25652 Stiffness of left hip, not elsewhere classified: Secondary | ICD-10-CM | POA: Diagnosis not present

## 2023-08-13 DIAGNOSIS — M25552 Pain in left hip: Secondary | ICD-10-CM | POA: Diagnosis not present

## 2023-08-13 DIAGNOSIS — G8929 Other chronic pain: Secondary | ICD-10-CM | POA: Diagnosis not present

## 2023-08-17 DIAGNOSIS — M25652 Stiffness of left hip, not elsewhere classified: Secondary | ICD-10-CM | POA: Diagnosis not present

## 2023-08-17 DIAGNOSIS — M6281 Muscle weakness (generalized): Secondary | ICD-10-CM | POA: Diagnosis not present

## 2023-08-17 DIAGNOSIS — S76012D Strain of muscle, fascia and tendon of left hip, subsequent encounter: Secondary | ICD-10-CM | POA: Diagnosis not present

## 2023-08-17 DIAGNOSIS — G8929 Other chronic pain: Secondary | ICD-10-CM | POA: Diagnosis not present

## 2023-08-17 DIAGNOSIS — M25552 Pain in left hip: Secondary | ICD-10-CM | POA: Diagnosis not present

## 2023-08-20 ENCOUNTER — Ambulatory Visit
Admission: RE | Admit: 2023-08-20 | Discharge: 2023-08-20 | Disposition: A | Discharge status: Home / Self Care | Source: Ambulatory Visit | Attending: Nurse Practitioner | Admitting: Nurse Practitioner

## 2023-08-20 DIAGNOSIS — M6281 Muscle weakness (generalized): Secondary | ICD-10-CM | POA: Diagnosis not present

## 2023-08-20 DIAGNOSIS — G8929 Other chronic pain: Secondary | ICD-10-CM | POA: Diagnosis not present

## 2023-08-20 DIAGNOSIS — S76012D Strain of muscle, fascia and tendon of left hip, subsequent encounter: Secondary | ICD-10-CM | POA: Diagnosis not present

## 2023-08-20 DIAGNOSIS — M25652 Stiffness of left hip, not elsewhere classified: Secondary | ICD-10-CM | POA: Diagnosis not present

## 2023-08-20 DIAGNOSIS — M25552 Pain in left hip: Secondary | ICD-10-CM | POA: Diagnosis not present

## 2023-08-20 DIAGNOSIS — Z1231 Encounter for screening mammogram for malignant neoplasm of breast: Secondary | ICD-10-CM | POA: Diagnosis not present

## 2023-08-24 DIAGNOSIS — M25552 Pain in left hip: Secondary | ICD-10-CM | POA: Diagnosis not present

## 2023-08-24 DIAGNOSIS — G8929 Other chronic pain: Secondary | ICD-10-CM | POA: Diagnosis not present

## 2023-08-25 ENCOUNTER — Encounter: Payer: Self-pay | Admitting: Nurse Practitioner

## 2023-08-26 DIAGNOSIS — M25652 Stiffness of left hip, not elsewhere classified: Secondary | ICD-10-CM | POA: Diagnosis not present

## 2023-08-26 DIAGNOSIS — S76012D Strain of muscle, fascia and tendon of left hip, subsequent encounter: Secondary | ICD-10-CM | POA: Diagnosis not present

## 2023-08-26 DIAGNOSIS — M6281 Muscle weakness (generalized): Secondary | ICD-10-CM | POA: Diagnosis not present

## 2023-08-26 DIAGNOSIS — G8929 Other chronic pain: Secondary | ICD-10-CM | POA: Diagnosis not present

## 2023-08-26 DIAGNOSIS — M25552 Pain in left hip: Secondary | ICD-10-CM | POA: Diagnosis not present

## 2023-08-27 ENCOUNTER — Ambulatory Visit: Payer: Medicare HMO | Admitting: Dermatology

## 2023-08-31 DIAGNOSIS — M25552 Pain in left hip: Secondary | ICD-10-CM | POA: Diagnosis not present

## 2023-08-31 DIAGNOSIS — S76012D Strain of muscle, fascia and tendon of left hip, subsequent encounter: Secondary | ICD-10-CM | POA: Diagnosis not present

## 2023-08-31 DIAGNOSIS — M6281 Muscle weakness (generalized): Secondary | ICD-10-CM | POA: Diagnosis not present

## 2023-08-31 DIAGNOSIS — M25652 Stiffness of left hip, not elsewhere classified: Secondary | ICD-10-CM | POA: Diagnosis not present

## 2023-08-31 DIAGNOSIS — G8929 Other chronic pain: Secondary | ICD-10-CM | POA: Diagnosis not present

## 2023-09-02 DIAGNOSIS — G8929 Other chronic pain: Secondary | ICD-10-CM | POA: Diagnosis not present

## 2023-09-02 DIAGNOSIS — M25652 Stiffness of left hip, not elsewhere classified: Secondary | ICD-10-CM | POA: Diagnosis not present

## 2023-09-02 DIAGNOSIS — M6281 Muscle weakness (generalized): Secondary | ICD-10-CM | POA: Diagnosis not present

## 2023-09-02 DIAGNOSIS — M25552 Pain in left hip: Secondary | ICD-10-CM | POA: Diagnosis not present

## 2023-09-02 DIAGNOSIS — S76012D Strain of muscle, fascia and tendon of left hip, subsequent encounter: Secondary | ICD-10-CM | POA: Diagnosis not present

## 2023-09-07 DIAGNOSIS — M25552 Pain in left hip: Secondary | ICD-10-CM | POA: Diagnosis not present

## 2023-09-07 DIAGNOSIS — G8929 Other chronic pain: Secondary | ICD-10-CM | POA: Diagnosis not present

## 2023-09-09 DIAGNOSIS — G8929 Other chronic pain: Secondary | ICD-10-CM | POA: Diagnosis not present

## 2023-09-09 DIAGNOSIS — M25552 Pain in left hip: Secondary | ICD-10-CM | POA: Diagnosis not present

## 2023-09-10 ENCOUNTER — Other Ambulatory Visit: Payer: Self-pay | Admitting: Nurse Practitioner

## 2023-09-10 DIAGNOSIS — E785 Hyperlipidemia, unspecified: Secondary | ICD-10-CM

## 2023-09-14 ENCOUNTER — Ambulatory Visit: Admitting: Dermatology

## 2023-09-14 ENCOUNTER — Encounter: Payer: Self-pay | Admitting: Dermatology

## 2023-09-14 DIAGNOSIS — W908XXA Exposure to other nonionizing radiation, initial encounter: Secondary | ICD-10-CM

## 2023-09-14 DIAGNOSIS — L821 Other seborrheic keratosis: Secondary | ICD-10-CM | POA: Diagnosis not present

## 2023-09-14 DIAGNOSIS — L814 Other melanin hyperpigmentation: Secondary | ICD-10-CM | POA: Diagnosis not present

## 2023-09-14 DIAGNOSIS — L820 Inflamed seborrheic keratosis: Secondary | ICD-10-CM

## 2023-09-14 DIAGNOSIS — D229 Melanocytic nevi, unspecified: Secondary | ICD-10-CM

## 2023-09-14 DIAGNOSIS — G8929 Other chronic pain: Secondary | ICD-10-CM | POA: Diagnosis not present

## 2023-09-14 DIAGNOSIS — D1801 Hemangioma of skin and subcutaneous tissue: Secondary | ICD-10-CM | POA: Diagnosis not present

## 2023-09-14 DIAGNOSIS — L570 Actinic keratosis: Secondary | ICD-10-CM | POA: Diagnosis not present

## 2023-09-14 DIAGNOSIS — Z1283 Encounter for screening for malignant neoplasm of skin: Secondary | ICD-10-CM

## 2023-09-14 DIAGNOSIS — L578 Other skin changes due to chronic exposure to nonionizing radiation: Secondary | ICD-10-CM | POA: Diagnosis not present

## 2023-09-14 DIAGNOSIS — M25552 Pain in left hip: Secondary | ICD-10-CM | POA: Diagnosis not present

## 2023-09-14 NOTE — Patient Instructions (Addendum)

## 2023-09-14 NOTE — Progress Notes (Signed)
 Follow-Up Visit   Subjective  Melissa [name] is a 68 y.o. female who presents for the following: Skin Cancer Screening and Full Body Skin Exam  The patient presents for Total-Body Skin Exam (TBSE) for skin cancer screening and mole check. The patient has spots, moles and lesions to be evaluated, some may be new or changing and the patient may have concern these could be cancer.  The following portions of the chart were reviewed this encounter and updated as appropriate: medications, allergies, medical history  Review of Systems:  No other skin or systemic complaints except as noted in HPI or Assessment and Plan.  Objective  Well appearing patient in no apparent distress; mood and affect are within normal limits.  A full examination was performed including scalp, head, eyes, ears, nose, lips, neck, chest, axillae, abdomen, back, buttocks, bilateral upper extremities, bilateral lower extremities, hands, feet, fingers, toes, fingernails, and toenails. All findings within normal limits unless otherwise noted below.   Relevant physical exam findings are noted in the Assessment and Plan.  left proximal mandible x 2 (2) Erythematous thin papules/macules with gritty scale.  right cheek x 2, right forehead x 1 (3) Stuck-on, waxy, tan-brown papule or plaque --Discussed benign etiology and prognosis.   Assessment & Plan   SKIN CANCER SCREENING PERFORMED TODAY.  LENTIGINES, SEBORRHEIC KERATOSES, HEMANGIOMAS - Benign normal skin lesions - Benign-appearing - Call for any changes  MELANOCYTIC NEVI - Tan-brown and/or pink-flesh-colored symmetric macules and papules - Benign appearing on exam today - Observation - Call clinic for new or changing moles - Recommend daily use of broad spectrum spf 30+ sunscreen to sun-exposed areas.   AK (ACTINIC KERATOSIS) (2) left proximal mandible x 2 (2) ACTINIC DAMAGE - chronic, secondary to cumulative UV radiation exposure/sun exposure over time -  diffuse scaly erythematous macules with underlying dyspigmentation - Recommend daily broad spectrum sunscreen SPF 30+ to sun-exposed areas, reapply every 2 hours as needed.  - Recommend staying in the shade or wearing long sleeves, sun glasses (UVA+UVB protection) and wide brim hats (4-inch brim around the entire circumference of the hat). - Call for new or changing lesions.  Destruction of lesion - left proximal mandible x 2 (2) Complexity: simple   Destruction method: cryotherapy   Informed consent: discussed and consent obtained   Timeout:  patient name, date of birth, surgical site, and procedure verified Lesion destroyed using liquid nitrogen: Yes   Region frozen until ice ball extended beyond lesion: Yes   Outcome: patient tolerated procedure well with no complications   Post-procedure details: wound care instructions given   INFLAMED SEBORRHEIC KERATOSIS (3) right cheek x 2, right forehead x 1 (3) Symptomatic, irritating, patient would like treated.  Destruction of lesion - right cheek x 2, right forehead x 1 (3) Complexity: simple   Destruction method: cryotherapy   Informed consent: discussed and consent obtained   Timeout:  patient name, date of birth, surgical site, and procedure verified Lesion destroyed using liquid nitrogen: Yes   Region frozen until ice ball extended beyond lesion: Yes   Outcome: patient tolerated procedure well with no complications   Post-procedure details: wound care instructions given     Return in about 1 year (around 09/13/2024) for TBSE, hx of AKs .  IClara Crisp, CMA, am acting as scribe for Celine Collard, MD .   Documentation: I have reviewed the above documentation for accuracy and completeness, and I agree with the above.  Celine Collard, MD

## 2023-10-07 DIAGNOSIS — M76892 Other specified enthesopathies of left lower limb, excluding foot: Secondary | ICD-10-CM | POA: Diagnosis not present

## 2023-10-10 ENCOUNTER — Other Ambulatory Visit: Payer: Self-pay | Admitting: Nurse Practitioner

## 2023-10-10 DIAGNOSIS — I10 Essential (primary) hypertension: Secondary | ICD-10-CM

## 2023-10-20 ENCOUNTER — Other Ambulatory Visit: Payer: Self-pay | Admitting: Nurse Practitioner

## 2023-10-20 DIAGNOSIS — E785 Hyperlipidemia, unspecified: Secondary | ICD-10-CM

## 2023-10-20 NOTE — Telephone Encounter (Unsigned)
 Copied from CRM [id number]. Topic: Clinical - Medication Refill >> Oct 20, 2023 11:39 AM Magdalene School wrote: Medication:  telmisartan  (MICARDIS ) 40 MG tablet atorvastatin  (LIPITOR) 10 MG tablet   Has the patient contacted their pharmacy? Yes (Agent: If no, request that the patient contact the pharmacy for the refill. If patient does not wish to contact the pharmacy document the reason why and proceed with request.) (Agent: If yes, when and what did the pharmacy advise?)  This is the patient's preferred pharmacy:  [facility] [address], [place] - [address] [place] [address] Phone: [phone] Fax: [phone]  Is this the correct pharmacy for this prescription? Yes If no, delete pharmacy and type the correct one.   Has the prescription been filled recently? No  Is the patient out of the medication? No  Has the patient been seen for an appointment in the last year OR does the patient have an upcoming appointment? Yes  Can we respond through MyChart? No  Agent: Please be advised that Rx refills may take up to 3 business days. We ask that you follow-up with your pharmacy.

## 2023-10-21 MED ORDER — ATORVASTATIN CALCIUM 10 MG PO TABS: 10.0000 mg | ORAL_TABLET | Freq: Every day | ORAL | 1 refills | Status: AC

## 2023-10-21 NOTE — Telephone Encounter (Signed)
 2nd attempt to contact pt did not leave a 2nd vm

## 2023-10-21 NOTE — Telephone Encounter (Unsigned)
 Copied from CRM [id number]. Topic: Clinical - Medication Question >> Oct 21, 2023  4:26 PM Aisha D wrote: Reason for CRM: Pt is returning a missed call from the office regarding a medication question. Kacy Lester,NP,wanted to confirm the right dose that the pt is taking for the telmisartan .  Pt stated that she is currently taking telmisartan  20 mg .

## 2023-10-21 NOTE — Telephone Encounter (Signed)
 Lvm for pt to give office a call. Need to confirm dosage of temisartan. Ok to Archivist

## 2023-10-23 ENCOUNTER — Other Ambulatory Visit: Payer: Self-pay | Admitting: Nurse Practitioner

## 2023-10-23 DIAGNOSIS — I10 Essential (primary) hypertension: Secondary | ICD-10-CM

## 2023-10-27 ENCOUNTER — Other Ambulatory Visit: Payer: Self-pay | Admitting: Nurse Practitioner

## 2023-10-27 DIAGNOSIS — I10 Essential (primary) hypertension: Secondary | ICD-10-CM

## 2023-10-27 MED ORDER — TELMISARTAN 20 MG PO TABS: 20.0000 mg | ORAL_TABLET | Freq: Every day | ORAL | 3 refills | Status: AC

## 2023-12-08 ENCOUNTER — Other Ambulatory Visit: Payer: Self-pay | Admitting: Dermatology

## 2023-12-08 DIAGNOSIS — L237 Allergic contact dermatitis due to plants, except food: Secondary | ICD-10-CM

## 2023-12-10 ENCOUNTER — Telehealth: Payer: Self-pay

## 2023-12-10 MED ORDER — BETAMETHASONE DIPROPIONATE AUG 0.05 % EX GEL: Freq: Two times a day (BID) | CUTANEOUS | 0 refills | Status: AC

## 2023-12-10 NOTE — Telephone Encounter (Signed)
 Patient called asking for RF of Betamethasone  due to Allergic contact dermatitis due to plants, except food.  Last refilled April 2024.  Okay to send in or patient needs appt?

## 2023-12-10 NOTE — Telephone Encounter (Signed)
 1 refill sent in and patient advised. aw

## 2024-01-04 IMAGING — MG MM DIGITAL SCREENING BILAT W/ TOMO AND CAD
6 of 10 series · 6 of 30 positions shown · non-contrast
Comparison: Previous exam(s).

CLINICAL DATA: Screening.

EXAM:
DIGITAL SCREENING BILATERAL MAMMOGRAM WITH TOMOSYNTHESIS AND CAD
TECHNIQUE: Bilateral screening digital craniocaudal and mediolateral oblique
mammograms were obtained. Bilateral screening digital breast
tomosynthesis was performed. The images were evaluated with
computer-aided detection.

[R CC synth-2D]
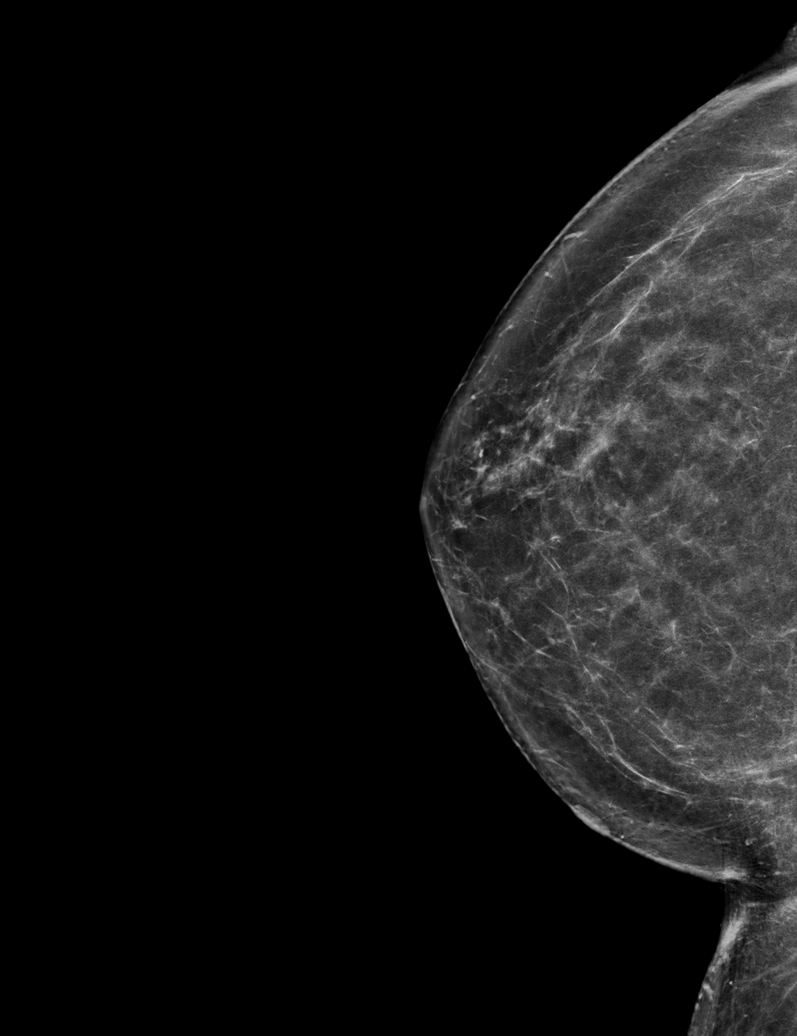

[R MLO synth-2D (1 of 2)]
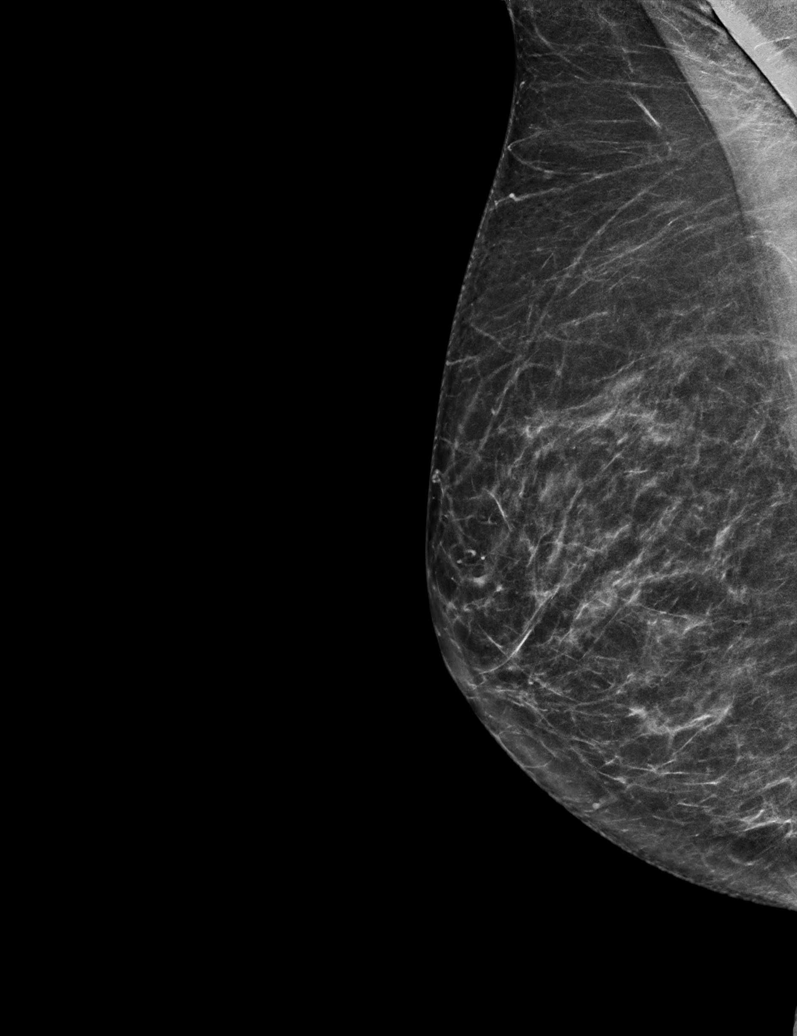

[R MLO synth-2D (2 of 2)]
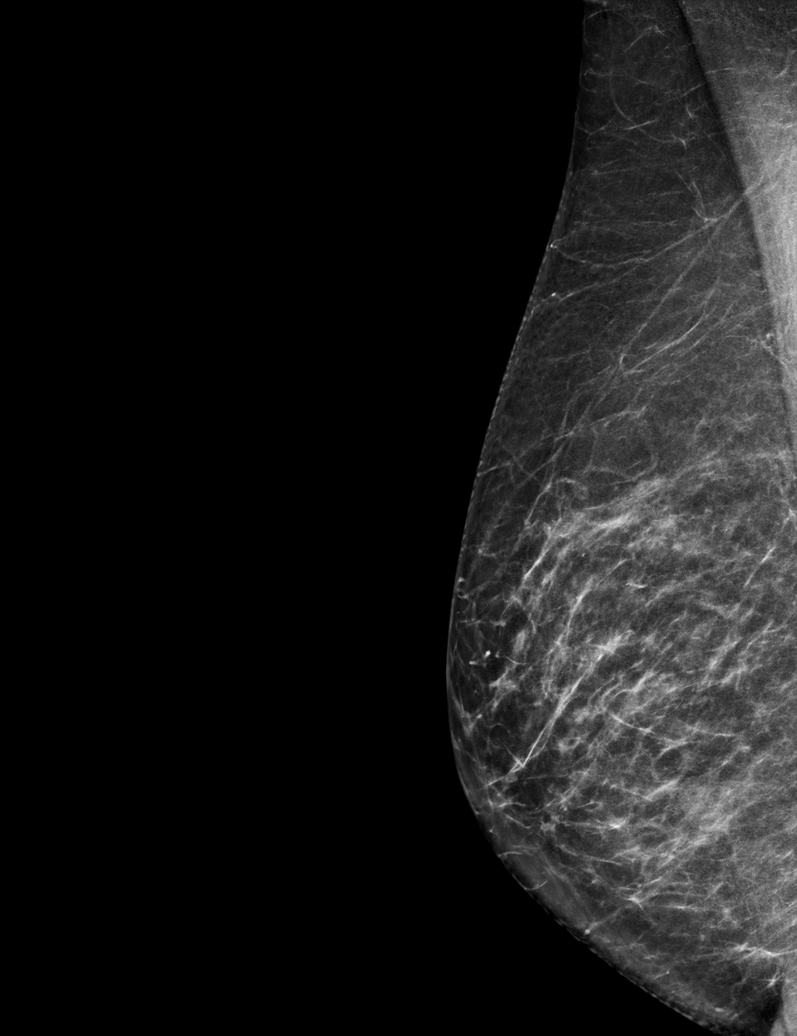

[L CC synth-2D]
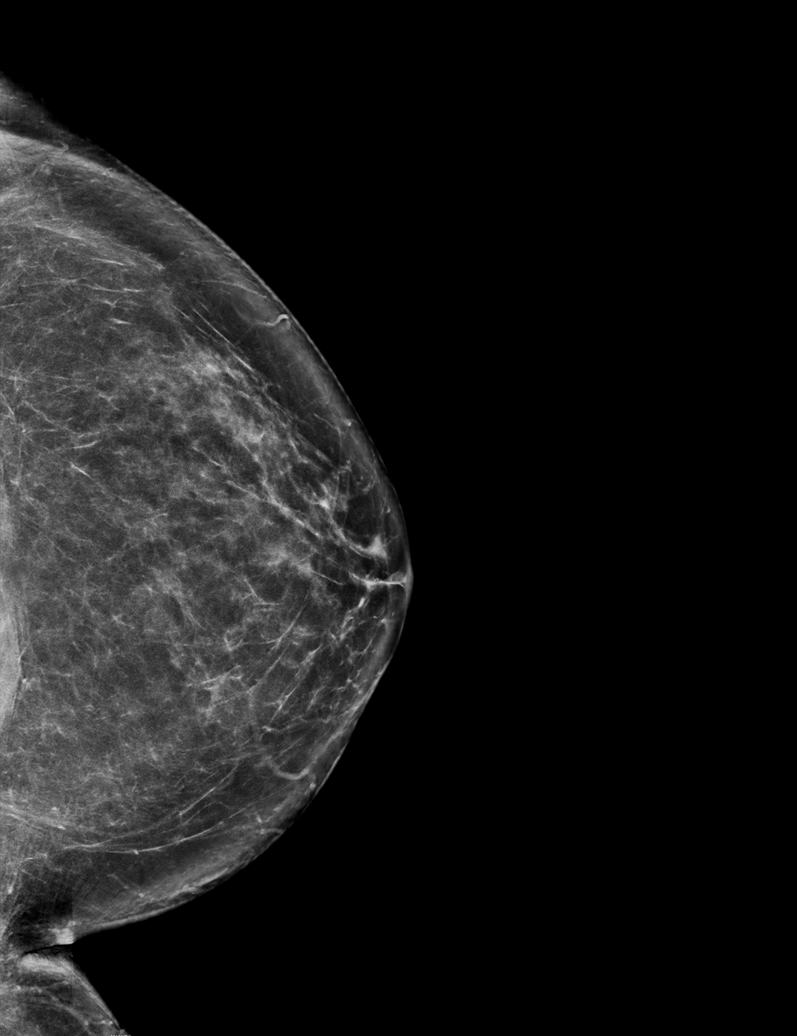

[L MLO synth-2D]
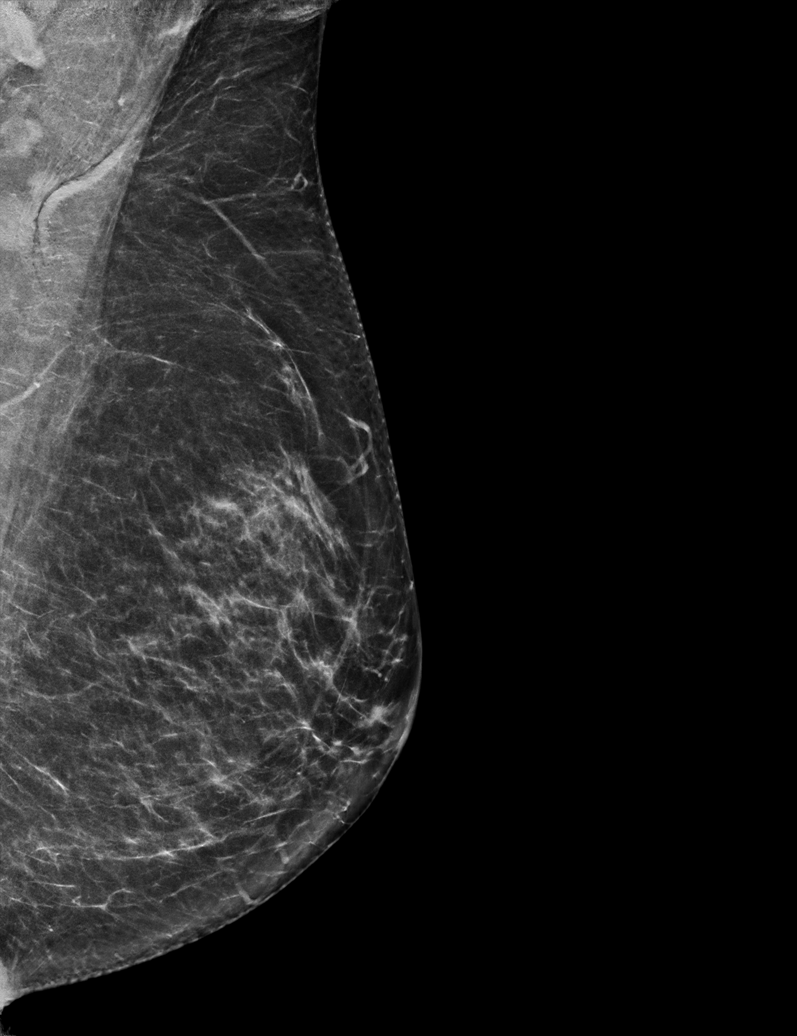

[R MLO tomo · tomo slice 33/64.0]
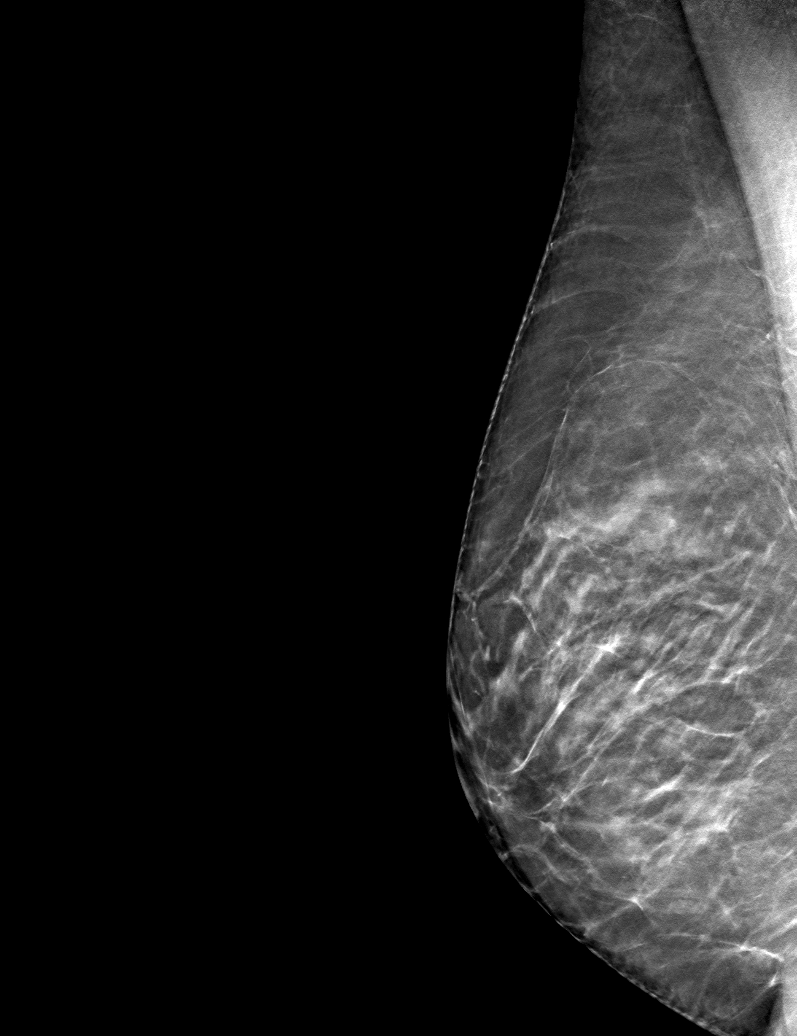

[6 of 30 positions shown; findings below may reference images not displayed]

ACR Breast Density Category b: There are scattered areas of
fibroglandular density.
FINDINGS: There are no findings suspicious for malignancy.
IMPRESSION: No mammographic evidence of malignancy. A result letter of this
screening mammogram will be mailed directly to the patient.

RECOMMENDATION:
Screening mammogram in one year. (Code:51-O-LD2)

BI-RADS CATEGORY  1: Negative.

## 2024-01-08 ENCOUNTER — Encounter: Payer: Self-pay | Admitting: Nurse Practitioner

## 2024-01-08 ENCOUNTER — Ambulatory Visit: Payer: Medicare HMO | Admitting: Nurse Practitioner

## 2024-01-08 VITALS — BP 118/62 | HR 56 | Temp 97.6°F | Resp 20 | Ht 63.5 in | Wt 169.2 lb

## 2024-01-08 DIAGNOSIS — E785 Hyperlipidemia, unspecified: Secondary | ICD-10-CM | POA: Diagnosis not present

## 2024-01-08 DIAGNOSIS — Z0000 Encounter for general adult medical examination without abnormal findings: Secondary | ICD-10-CM | POA: Diagnosis not present

## 2024-01-08 DIAGNOSIS — I10 Essential (primary) hypertension: Secondary | ICD-10-CM

## 2024-01-08 DIAGNOSIS — N1832 Chronic kidney disease, stage 3b: Secondary | ICD-10-CM | POA: Diagnosis not present

## 2024-01-08 DIAGNOSIS — Z1329 Encounter for screening for other suspected endocrine disorder: Secondary | ICD-10-CM | POA: Diagnosis not present

## 2024-01-08 DIAGNOSIS — R7303 Prediabetes: Secondary | ICD-10-CM

## 2024-01-08 DIAGNOSIS — D509 Iron deficiency anemia, unspecified: Secondary | ICD-10-CM | POA: Diagnosis not present

## 2024-01-08 LAB — CBC WITH DIFFERENTIAL/PLATELET
Basophils Absolute: 0 K/uL (ref 0.0–0.1)
Basophils Relative: 0.8 % (ref 0.0–3.0)
Eosinophils Absolute: 0.1 K/uL (ref 0.0–0.7)
Eosinophils Relative: 3.3 % (ref 0.0–5.0)
HCT: 34.6 % — ABNORMAL LOW (ref 36.0–46.0)
Hemoglobin: 11.6 g/dL — ABNORMAL LOW (ref 12.0–15.0)
Lymphocytes Relative: 31.8 % (ref 12.0–46.0)
Lymphs Abs: 1.3 K/uL (ref 0.7–4.0)
MCHC: 33.4 g/dL (ref 30.0–36.0)
MCV: 94.5 fl (ref 78.0–100.0)
Monocytes Absolute: 0.4 K/uL (ref 0.1–1.0)
Monocytes Relative: 9.5 % (ref 3.0–12.0)
Neutro Abs: 2.2 K/uL (ref 1.4–7.7)
Neutrophils Relative %: 54.6 % (ref 43.0–77.0)
Platelets: 166 K/uL (ref 150.0–400.0)
RBC: 3.66 Mil/uL — ABNORMAL LOW (ref 3.87–5.11)
RDW: 12.7 % (ref 11.5–15.5)
WBC: 4.1 K/uL (ref 4.0–10.5)

## 2024-01-08 LAB — COMPREHENSIVE METABOLIC PANEL WITH GFR
ALT: 17 U/L (ref 0–35)
AST: 22 U/L (ref 0–37)
Albumin: 4.2 g/dL (ref 3.5–5.2)
Alkaline Phosphatase: 22 U/L — ABNORMAL LOW (ref 39–117)
BUN: 33 mg/dL — ABNORMAL HIGH (ref 6–23)
CO2: 29 meq/L (ref 19–32)
Calcium: 9.7 mg/dL (ref 8.4–10.5)
Chloride: 104 meq/L (ref 96–112)
Creatinine, Ser: 1.24 mg/dL — ABNORMAL HIGH (ref 0.40–1.20)
GFR: 44.4 mL/min — ABNORMAL LOW (ref >60.00)
Glucose, Bld: 97 mg/dL (ref 70–99)
Potassium: 4.3 meq/L (ref 3.5–5.1)
Sodium: 142 meq/L (ref 135–145)
Total Bilirubin: 0.4 mg/dL (ref 0.2–1.2)
Total Protein: 6.4 g/dL (ref 6.0–8.3)

## 2024-01-08 LAB — LIPID PANEL
Cholesterol: 165 mg/dL (ref 0–200)
HDL: 77.9 mg/dL (ref >39.00)
LDL Cholesterol: 76 mg/dL (ref 0–99)
NonHDL: 87.57
Total CHOL/HDL Ratio: 2
Triglycerides: 58 mg/dL (ref 0.0–149.0)
VLDL: 11.6 mg/dL (ref 0.0–40.0)

## 2024-01-08 LAB — VITAMIN D 25 HYDROXY (VIT D DEFICIENCY, FRACTURES): VITD: 77.95 ng/mL (ref 30.00–100.00)

## 2024-01-08 LAB — IBC + FERRITIN
Ferritin: 157.3 ng/mL (ref 10.0–291.0)
Iron: 48 ug/dL (ref 42–145)
Saturation Ratios: 17.9 % — ABNORMAL LOW (ref 20.0–50.0)
TIBC: 268.8 ug/dL (ref 250.0–450.0)
Transferrin: 192 mg/dL — ABNORMAL LOW (ref 212.0–360.0)

## 2024-01-08 LAB — HEMOGLOBIN A1C: Hgb A1c MFr Bld: 6.2 % (ref 4.6–6.5)

## 2024-01-08 LAB — TSH: TSH: 1.58 u[IU]/mL (ref 0.35–5.50)

## 2024-01-08 NOTE — Progress Notes (Signed)
 Leron Glance, NP-C Phone: [phone]  Melissa [name] is a 68 y.o. female who presents today for annual exam.  Discussed the use of AI scribe software for clinical note transcription with the patient, who gave verbal consent to proceed.  History of Present Illness   Melissa [name] is a 68 year old female with hypertension who presents for annual exam.  She has been experiencing bilateral ankle swelling since the summer, which worsens with heat and prolonged standing but improves with elevation. There is no associated pain. No chest pain, shortness of breath, dizziness, or headaches.  She is currently taking amlodipine  2.5 mg once daily at night and telmisartan  daily for hypertension. She has not found it necessary to take a second amlodipine  dose. She takes Senokot daily for constipation and an iron supplement, ferrous sulfate, which may contribute to her constipation. No burning during urination, abdominal pain, or trouble swallowing.  Her social history includes no smoking and rare alcohol consumption, typically only on special occasions. She maintains a well-balanced diet, primarily cooking at home, and has recently resumed exercise at the [facility] after a hiatus due to her husband's health issues.  She has a history of hip surgery in February and has completed physical therapy. She also mentions a recent increase in knuckle size and stiffness in her fingers, particularly after engaging in a project, but does not take any anti-inflammatory medications due to previous advice regarding her kidneys. She uses melatonin nightly to aid sleep.      Social History   Tobacco Use  Smoking Status Never  Smokeless Tobacco Never    Current Outpatient Medications on File Prior to Visit  Medication Sig Dispense Refill   acetaminophen  (TYLENOL ) 500 MG tablet Take 2 tablets (1,000 mg total) by mouth every 8 (eight) hours. (Patient taking differently: Take 1,000 mg by mouth every 8 (eight) hours as  needed.) 90 tablet 2   atorvastatin  (LIPITOR) 10 MG tablet Take 1 tablet (10 mg total) by mouth daily at 6 PM. 90 tablet 1   betamethasone , augmented, (DIPROLENE ) 0.05 % gel Apply topically 2 (two) times daily. As needed to rash 30 g 0   Calcium -Magnesium-Vitamin D  (CALCIUM  1200+D3 PO) Take 1 tablet by mouth daily.     Crisaborole  (EUCRISA ) 2 % OINT Apply 1 Application topically daily as needed (eczema).     ferrous sulfate 325 (65 FE) MG tablet Take 325 mg by mouth daily.     fexofenadine -pseudoephedrine (ALLEGRA-D) 60-120 MG 12 hr tablet Take 1 tablet by mouth 2 (two) times daily. (Patient taking differently: Take 1 tablet by mouth daily.) 20 tablet 0   Multiple Vitamin (MULTI-VITAMINS) TABS Take 1 tablet by mouth daily.     oxybutynin  (DITROPAN -XL) 10 MG 24 hr tablet TAKE 1 TABLET BY MOUTH EVERYDAY AT BEDTIME 90 tablet 3   senna-docusate (SENEXON-S) 8.6-50 MG tablet TAKE 1 TABLET BY MOUTH DAILY AS NEEDED FOR CONSTIPATION. 90 tablet 3   telmisartan  (MICARDIS ) 20 MG tablet Take 1 tablet (20 mg total) by mouth daily. 90 tablet 3   No current facility-administered medications on file prior to visit.     ROS see history of present illness  Objective  Physical Exam Vitals:   01/08/24 0821  BP: 118/62  Pulse: (!) 56  Resp: 20  Temp: 97.6 F (36.4 C)  SpO2: 96%    BP Readings from Last 3 Encounters:  01/08/24 118/62  06/30/23 (!) 165/82  06/23/23 128/76   Wt Readings from Last 3 Encounters:  01/27/24  167 lb (75.8 kg)  01/08/24 169 lb 4 oz (76.8 kg)  06/23/23 173 lb (78.5 kg)    Physical Exam Constitutional:      General: She is not in acute distress.    Appearance: Normal appearance.  HENT:     Head: Normocephalic.     Right Ear: Tympanic membrane normal.     Left Ear: Tympanic membrane normal.     Nose: Nose normal.     Mouth/Throat:     Mouth: Mucous membranes are moist.     Pharynx: Oropharynx is clear.  Eyes:     Conjunctiva/sclera: Conjunctivae normal.      Pupils: Pupils are equal, round, and reactive to light.  Neck:     Thyroid : No thyromegaly.  Cardiovascular:     Rate and Rhythm: Normal rate and regular rhythm.     Heart sounds: Normal heart sounds.  Pulmonary:     Effort: Pulmonary effort is normal.     Breath sounds: Normal breath sounds.  Abdominal:     General: Abdomen is flat. Bowel sounds are normal.     Palpations: Abdomen is soft. There is no mass.     Tenderness: There is no abdominal tenderness.  Musculoskeletal:        General: Normal range of motion.     Right lower leg: No edema.     Left lower leg: No edema.  Lymphadenopathy:     Cervical: No cervical adenopathy.  Skin:    General: Skin is warm and dry.     Findings: No rash.  Neurological:     General: No focal deficit present.     Mental Status: She is alert.  Psychiatric:        Mood and Affect: Mood normal.        Behavior: Behavior normal.      Assessment/Plan: Please see individual problem list.  Preventative health care Assessment & Plan: Physical exam complete. We will check lab work as outlined. Pap smear is no longer indicated. Mammogram and colonoscopy are up to date. Flu vaccine not yet due. Tetanus vaccine is up to date and she declines additional COVID vaccine. Shingles and pneumonia vaccine series completed. Continue routine dental and eye exams. Encouraged to continue healthy diet and exercise. Return to care in 1 year, sooner as needed.   Essential hypertension Assessment & Plan: Blood pressure is well controlled on Telmisartan  20 mg daily and Norvasc  2.5 mg daily. Continue current medication regimen. Mild ankle edema is due to heat and prolonged standing, resolving with elevation. Advise hydration and leg elevation. Check CMP today.   Orders: -     Comprehensive metabolic panel with GFR  Hyperlipidemia, unspecified hyperlipidemia type Assessment & Plan: Managed with Lipitor 10 mg daily. Continue. Check lipid panel today.   Orders: -      Lipid panel  Iron deficiency anemia, unspecified iron deficiency anemia type Assessment & Plan: Managed with Ferrous Sulfate 325 mg daily. Experiences constipation managed with Senokot, exacerbated by iron supplementation. Continue current medication regimen. Check CBC and iron panel today.   Orders: -     CBC with Differential/Platelet -     IBC + Ferritin  Prediabetes Assessment & Plan: Check A1c. Encourage healthy diet and regular exercise.   Orders: -     Hemoglobin A1c  Stage 3b chronic kidney disease (HCC) -     Comprehensive metabolic panel with GFR -     VITAMIN D  25 Hydroxy (Vit-D Deficiency, Fractures)  Thyroid  disorder  screen -     TSH     Return in about 1 year (around 01/07/2025) for Annual Exam, sooner as needed.   Leron Glance, NP-C  Primary Care - [facility]

## 2024-01-11 ENCOUNTER — Other Ambulatory Visit: Payer: Self-pay | Admitting: Nurse Practitioner

## 2024-01-11 DIAGNOSIS — I10 Essential (primary) hypertension: Secondary | ICD-10-CM

## 2024-01-13 ENCOUNTER — Ambulatory Visit: Payer: Self-pay | Admitting: Nurse Practitioner

## 2024-01-13 DIAGNOSIS — M76892 Other specified enthesopathies of left lower limb, excluding foot: Secondary | ICD-10-CM | POA: Diagnosis not present

## 2024-01-27 ENCOUNTER — Ambulatory Visit (INDEPENDENT_AMBULATORY_CARE_PROVIDER_SITE_OTHER): Payer: Medicare HMO | Admitting: *Deleted

## 2024-01-27 ENCOUNTER — Encounter: Payer: Self-pay | Admitting: Nurse Practitioner

## 2024-01-27 VITALS — Ht 63.5 in | Wt 167.0 lb

## 2024-01-27 DIAGNOSIS — Z0000 Encounter for general adult medical examination without abnormal findings: Secondary | ICD-10-CM | POA: Diagnosis not present

## 2024-01-27 NOTE — Assessment & Plan Note (Signed)
 Check A1c. Encourage healthy diet and regular exercise.

## 2024-01-27 NOTE — Patient Instructions (Signed)
 Melissa [name],  Thank you for taking the time for your Medicare Wellness Visit. I appreciate your continued commitment to your health goals. Please review the care plan we discussed, and feel free to reach out if I can assist you further.  Medicare recommends these wellness visits once per year to help you and your care team stay ahead of potential health issues. These visits are designed to focus on prevention, allowing your provider to concentrate on managing your acute and chronic conditions during your regular appointments.  Please note that Annual Wellness Visits do not include a physical exam. Some assessments may be limited, especially if the visit was conducted virtually. If needed, we may recommend a separate in-person follow-up with your provider.  Ongoing Care Seeing your primary care provider every 3 to 6 months helps us  monitor your health and provide consistent, personalized care.  Remember to get your annual flu and covid vaccines  Referrals If a referral was made during today's visit and you haven't received any updates within two weeks, please contact the referred provider directly to check on the status.  Recommended Screenings:  Health Maintenance  Topic Date Due   COVID-19 Vaccine (8 - 2025-26 season) 01/04/2024   Flu Shot  08/02/2024*   Medicare Annual Wellness Visit  01/26/2025   Breast Cancer Screening  08/19/2025   Colon Cancer Screening  04/18/2026   DTaP/Tdap/Td vaccine (2 - Td or Tdap) 05/30/2027   Pneumococcal Vaccine for age over 58  Completed   DEXA scan (bone density measurement)  Completed   Hepatitis C Screening  Completed   Zoster (Shingles) Vaccine  Completed   HPV Vaccine  Aged Out   Meningitis B Vaccine  Aged Out  *Topic was postponed. The date shown is not the original due date.       01/27/2024    1:11 PM  Advanced Directives  Does Patient Have a Medical Advance Directive? No  Would patient like information on creating a medical advance  directive? No - Patient declined   Advance Care Planning is important because it: Ensures you receive medical care that aligns with your values, goals, and preferences. Provides guidance to your family and loved ones, reducing the emotional burden of decision-making during critical moments.  Vision: Annual vision screenings are recommended for early detection of glaucoma, cataracts, and diabetic retinopathy. These exams can also reveal signs of chronic conditions such as diabetes and high blood pressure.  Dental: Annual dental screenings help detect early signs of oral cancer, gum disease, and other conditions linked to overall health, including heart disease and diabetes.  Please see the attached documents for additional preventive care recommendations.

## 2024-01-27 NOTE — Assessment & Plan Note (Signed)
 Managed with Ferrous Sulfate 325 mg daily. Experiences constipation managed with Senokot, exacerbated by iron supplementation. Continue current medication regimen. Check CBC and iron panel today.

## 2024-01-27 NOTE — Assessment & Plan Note (Signed)
 Physical exam complete. We will check lab work as outlined. Pap smear is no longer indicated. Mammogram and colonoscopy are up to date. Flu vaccine not yet due. Tetanus vaccine is up to date and she declines additional COVID vaccine. Shingles and pneumonia vaccine series completed. Continue routine dental and eye exams. Encouraged to continue healthy diet and exercise. Return to care in 1 year, sooner as needed.

## 2024-01-27 NOTE — Progress Notes (Signed)
 Subjective:   Melissa [name] is a 68 y.o. who presents for a Medicare Wellness preventive visit.  As a reminder, Annual Wellness Visits don't include a physical exam, and some assessments may be limited, especially if this visit is performed virtually. We may recommend an in-person follow-up visit with your provider if needed.  Visit Complete: Virtual I connected with  Melissa [name] on 01/27/24 by a audio enabled telemedicine application and verified that I am speaking with the correct person using two identifiers.  Patient Location: Home  Provider Location: Home Office  I discussed the limitations of evaluation and management by telemedicine. The patient expressed understanding and agreed to proceed.  Vital Signs: Because this visit was a virtual/telehealth visit, some criteria may be missing or patient reported. Any vitals not documented were not able to be obtained and vitals that have been documented are patient reported.  VideoDeclined- This patient declined Librarian, academic. Therefore the visit was completed with audio only.  Persons Participating in Visit: Patient.  AWV Questionnaire: No: Patient Medicare AWV questionnaire was not completed prior to this visit.  Cardiac Risk Factors include: advanced age (>43men, >68 women);dyslipidemia;hypertension     Objective:    Today's Vitals   01/27/24 1256  Weight: 167 lb (75.8 kg)  Height: 5' 3.5 (1.613 m)   Body mass index is 29.12 kg/m.     01/27/2024    1:11 PM 06/30/2023   12:48 PM 06/23/2023    9:11 AM 01/21/2023    1:00 PM 01/08/2022    8:42 AM 10/02/2021   10:25 AM 12/19/2020    1:28 PM  Advanced Directives  Does Patient Have a Medical Advance Directive? No No No No No No No  Would patient like information on creating a medical advance directive? No - Patient declined No - Patient declined  No - Patient declined No - Patient declined No - Patient declined No - Patient declined     Current Medications (verified) Outpatient Encounter Medications as of 01/27/2024  Medication Sig   acetaminophen  (TYLENOL ) 500 MG tablet Take 2 tablets (1,000 mg total) by mouth every 8 (eight) hours. (Patient taking differently: Take 1,000 mg by mouth every 8 (eight) hours as needed.)   amLODipine  (NORVASC ) 2.5 MG tablet TAKE 1 TABLET BY MOUTH IN THE MORNING AND AT BEDTIME. CAN TAKE ADDITIONAL DOSE IF BP >130/>80   aspirin  EC 81 MG tablet Take 81 mg by mouth daily. Swallow whole.   atorvastatin  (LIPITOR) 10 MG tablet Take 1 tablet (10 mg total) by mouth daily at 6 PM.   betamethasone , augmented, (DIPROLENE ) 0.05 % gel Apply topically 2 (two) times daily. As needed to rash   Calcium -Magnesium-Vitamin D  (CALCIUM  1200+D3 PO) Take 1 tablet by mouth daily.   Crisaborole  (EUCRISA ) 2 % OINT Apply 1 Application topically daily as needed (eczema).   ferrous sulfate 325 (65 FE) MG tablet Take 325 mg by mouth daily.   fexofenadine -pseudoephedrine (ALLEGRA-D) 60-120 MG 12 hr tablet Take 1 tablet by mouth 2 (two) times daily. (Patient taking differently: Take 1 tablet by mouth daily.)   Melatonin 10 MG TABS Take by mouth at bedtime as needed.   Multiple Vitamin (MULTI-VITAMINS) TABS Take 1 tablet by mouth daily.   oxybutynin  (DITROPAN -XL) 10 MG 24 hr tablet TAKE 1 TABLET BY MOUTH EVERYDAY AT BEDTIME   senna-docusate (SENEXON-S) 8.6-50 MG tablet TAKE 1 TABLET BY MOUTH DAILY AS NEEDED FOR CONSTIPATION.   telmisartan  (MICARDIS ) 20 MG tablet Take 1 tablet (20  mg total) by mouth daily.   No facility-administered encounter medications on file as of 01/27/2024.    Allergies (verified) Terfenadine   History: Past Medical History:  Diagnosis Date   Actinic keratoses    Allergy    Arthritis    Chicken pox    Colon polyps    COVID-19    2021   Dermatitis    Essential hypertension    GERD (gastroesophageal reflux disease)    Heart murmur    youth   Hyperlipidemia    Iron deficiency anemia     Preventative health care 01/06/2023   Stage 3b chronic kidney disease (CKD) (HCC)    Urgency incontinence    Past Surgical History:  Procedure Laterality Date   BUNIONECTOMY Bilateral    CATARACT EXTRACTION W/PHACO Left 10/02/2021   Procedure: CATARACT EXTRACTION PHACO AND INTRAOCULAR LENS PLACEMENT (IOC) LEFT;  Surgeon: Mittie Gaskin, MD;  Location: [facility];  Service: Ophthalmology;  Laterality: Left;  3.83 00:45.7   CATARACT EXTRACTION W/PHACO Right 10/16/2021   Procedure: CATARACT EXTRACTION PHACO AND INTRAOCULAR LENS PLACEMENT (IOC) RIGHT 5.10 00:58.2;  Surgeon: Mittie Gaskin, MD;  Location: [facility];  Service: Ophthalmology;  Laterality: Right;   COLONOSCOPY     1998, 2002, 2007, 2012, 2017, 2023   COLONOSCOPY WITH PROPOFOL  N/A 04/18/2016   Procedure: COLONOSCOPY WITH PROPOFOL ;  Surgeon: Lamar ONEIDA Holmes, MD;  Location: [facility];  Service: Endoscopy;  Laterality: N/A;   ESOPHAGOGASTRODUODENOSCOPY  10/15/2001   HIP SURGERY Left 06/2023   TONSILLECTOMY AND ADENOIDECTOMY     Family History  Problem Relation Age of Onset   Arthritis Mother    Diabetes Mother    Stroke Mother    Arthritis Father    Diabetes Father    Early death Father    Heart disease Father        MI 81 y.o    Diabetes Sister    Breast cancer Neg Hx    Social History   Socioeconomic History   Marital status: Married    Spouse name: Doug   Number of children: 1   Years of education: Not on file   Highest education level: Associate degree: occupational, Scientist, product/process development, or vocational program  Occupational History   Not on file  Tobacco Use   Smoking status: Never   Smokeless tobacco: Never  Vaping Use   Vaping status: Never Used  Substance and Sexual Activity   Alcohol use: Not Currently    Comment: occasionally wine    Drug use: No   Sexual activity: Not Currently    Birth control/protection: Post-menopausal  Other Topics Concern   Not on file  Social  History Narrative   Married    1 daughter age 79 as of 03/2017    Retired   Social Drivers of Corporate investment banker Strain: Low Risk  (01/27/2024)   Overall Financial Resource Strain (CARDIA)    Difficulty of Paying Living Expenses: Not hard at all  Food Insecurity: No Food Insecurity (01/27/2024)   Hunger Vital Sign    Worried About Running Out of Food in the Last Year: Never true    Ran Out of Food in the Last Year: Never true  Transportation Needs: No Transportation Needs (01/27/2024)   PRAPARE - Administrator, Civil Service (Medical): No    Lack of Transportation (Non-Medical): No  Physical Activity: Sufficiently Active (01/27/2024)   Exercise Vital Sign    Days of Exercise per Week: 4 days  Minutes of Exercise per Session: 60 min  Stress: No Stress Concern Present (01/27/2024)   [employer] of Occupational Health - Occupational Stress Questionnaire    Feeling of Stress: Not at all  Social Connections: Socially Integrated (01/27/2024)   Social Connection and Isolation Panel    Frequency of Communication with Friends and Family: Twice a week    Frequency of Social Gatherings with Friends and Family: Twice a week    Attends Religious Services: More than 4 times per year    Active Member of [employer] or Organizations: Yes    Attends Engineer, structural: More than 4 times per year    Marital Status: Married    Tobacco Counseling Counseling given: Not Answered    Clinical Intake:  Pre-visit preparation completed: Yes  Pain : No/denies pain     BMI - recorded: 29.12 Nutritional Status: BMI 25 -29 Overweight Nutritional Risks: None Diabetes: No  Lab Results  Component Value Date   HGBA1C 6.2 01/08/2024   HGBA1C 5.9 01/06/2023   HGBA1C 6.1 01/09/2022     How often do you need to have someone help you when you read instructions, pamphlets, or other written materials from your doctor or pharmacy?: 1 - Never  Interpreter Needed?:  No  Information entered by :: R. Sears Oran LPN   Activities of Daily Living     01/27/2024   12:57 PM 06/30/2023   12:52 PM  In your present state of health, do you have any difficulty performing the following activities:  Hearing? 0   Vision? 0   Difficulty concentrating or making decisions? 0   Walking or climbing stairs? 0   Dressing or bathing? 0   Doing errands, shopping? 0 0  Preparing Food and eating ? N   Using the Toilet? N   In the past six months, have you accidently leaked urine? Y   Do you have problems with loss of bowel control? N   Managing your Medications? N   Managing your Finances? N   Housekeeping or managing your Housekeeping? N     Patient Care Team: Gretel App, NP as PCP - General (Nurse Practitioner) Kathlynn Sharper, MD as Consulting Physician (Orthopedic Surgery) Hester Alm BROCKS, MD (Dermatology) Dennise Capri, MD as Consulting Physician (Nephrology)  I have updated your Care Teams any recent Medical Services you may have received from other providers in the past year.     Assessment:   This is a routine wellness examination for Doris.  Hearing/Vision screen Hearing Screening - Comments:: No issues Vision Screening - Comments:: glasses   Goals Addressed             This Visit's Progress    Patient Stated       Wants to lose 20 pounds       Depression Screen     01/27/2024    1:04 PM 01/08/2024    8:25 AM 06/23/2023    8:17 AM 01/21/2023   12:58 PM 01/21/2023   12:57 PM 07/22/2022    9:50 AM 05/29/2022    8:30 AM  PHQ 2/9 Scores  PHQ - 2 Score 0 0 0 0 0 0 0  PHQ- 9 Score 1 1  0 0 2     Fall Risk     01/27/2024   12:59 PM 01/08/2024    8:25 AM 06/23/2023    8:17 AM 01/19/2023   12:13 PM 12/03/2022    1:12 PM  Fall Risk   Falls in the  past year? 0 0 0 0 1  Number falls in past yr: 0 0 0 0 0  Injury with Fall? 0  0 0 0  Risk for fall due to : No Fall Risks No Fall Risks No Fall Risks No Fall Risks No Fall Risks  Follow up Falls  evaluation completed;Falls prevention discussed Falls evaluation completed;Education provided Falls evaluation completed Falls prevention discussed;Falls evaluation completed Falls evaluation completed    MEDICARE RISK AT HOME:  Medicare Risk at Home Any stairs in or around the home?: No If so, are there any without handrails?: No Home free of loose throw rugs in walkways, pet beds, electrical cords, etc?: Yes Adequate lighting in your home to reduce risk of falls?: Yes Life alert?: No Use of a cane, walker or w/c?: No Grab bars in the bathroom?: Yes Shower chair or bench in shower?: Yes Elevated toilet seat or a handicapped toilet?: Yes  TIMED UP AND GO:  Was the test performed?  No  Cognitive Function: 6CIT completed        01/27/2024    1:11 PM 01/21/2023    1:00 PM 01/08/2022    8:45 AM  6CIT Screen  What Year? 0 points 0 points 0 points  What month? 0 points 0 points 0 points  What time? 0 points 0 points 0 points  Count back from 20 0 points 0 points 0 points  Months in reverse 0 points 0 points 0 points  Repeat phrase 0 points 2 points 0 points  Total Score 0 points 2 points 0 points    Immunizations Immunization History  Administered Date(s) Administered   Fluad Quad(high Dose 65+) 02/13/2022   INFLUENZA, HIGH DOSE SEASONAL PF 02/10/2020, 03/13/2021, 02/03/2023   Influenza,inj,Quad PF,6+ Mos 02/12/2018, 02/16/2019   Influenza-Unspecified 03/19/2017, 02/16/2019   Moderna Sars-Covid-2 Vaccination 02/17/2023   PFIZER(Purple Top)SARS-COV-2 Vaccination 05/11/2019, 06/01/2019, 02/10/2020   PNEUMOCOCCAL CONJUGATE-20 01/01/2022   Pfizer Covid-19 Vaccine Bivalent Booster 40yrs & up 02/26/2021   Pneumococcal Conjugate-13 10/13/2019   Tdap 05/29/2017   Unspecified SARS-COV-2 Vaccination 02/06/2022   Zoster Recombinant(Shingrix ) 03/17/2018, 07/23/2018    Screening Tests Health Maintenance  Topic Date Due   COVID-19 Vaccine (8 - 2025-26 season) 01/04/2024   Medicare  Annual Wellness (AWV)  01/21/2024   Influenza Vaccine  08/02/2024 (Originally 12/04/2023)   Mammogram  08/19/2025   Colonoscopy  04/18/2026   DTaP/Tdap/Td (2 - Td or Tdap) 05/30/2027   Pneumococcal Vaccine: 50+ Years  Completed   DEXA SCAN  Completed   Hepatitis C Screening  Completed   Zoster Vaccines- Shingrix   Completed   HPV VACCINES  Aged Out   Meningococcal B Vaccine  Aged Out    Health Maintenance Items Addressed: Discussed the need to update flu and covid vaccines  Additional Screening:  Vision Screening: Recommended annual ophthalmology exams for early detection of glaucoma and other disorders of the eye. Is the patient up to date with their annual eye exam?  Yes  Who is the provider or what is the name of the office in which the patient attends annual eye exams?  [facility]  Dental Screening: Recommended annual dental exams for proper oral hygiene  Community Resource Referral / Chronic Care Management: CRR required this visit?  No   CCM required this visit?  No   Plan:    I have personally reviewed and noted the following in the patient's chart:   Medical and social history Use of alcohol, tobacco or illicit drugs  Current medications and  supplements including opioid prescriptions. Patient is not currently taking opioid prescriptions. Functional ability and status Nutritional status Physical activity Advanced directives List of other physicians Hospitalizations, surgeries, and ER visits in previous 12 months Vitals Screenings to include cognitive, depression, and falls Referrals and appointments  In addition, I have reviewed and discussed with patient certain preventive protocols, quality metrics, and best practice recommendations. A written personalized care plan for preventive services as well as general preventive health recommendations were provided to patient.   Angeline Fredericks, LPN   [id number]   After Visit Summary: (MyChart) Due to this being a  telephonic visit, the after visit summary with patients personalized plan was offered to patient via MyChart   Notes: Nothing significant to report at this time.

## 2024-01-27 NOTE — Assessment & Plan Note (Signed)
 Managed with Lipitor 10 mg daily. Continue. Check lipid panel today.

## 2024-01-27 NOTE — Assessment & Plan Note (Addendum)
 Blood pressure is well controlled on Telmisartan  20 mg daily and Norvasc  2.5 mg daily. Continue current medication regimen. Mild ankle edema is due to heat and prolonged standing, resolving with elevation. Advise hydration and leg elevation. Check CMP today.

## 2024-04-22 DIAGNOSIS — H52223 Regular astigmatism, bilateral: Secondary | ICD-10-CM | POA: Diagnosis not present

## 2024-04-22 DIAGNOSIS — H26492 Other secondary cataract, left eye: Secondary | ICD-10-CM | POA: Diagnosis not present

## 2024-04-22 DIAGNOSIS — H04123 Dry eye syndrome of bilateral lacrimal glands: Secondary | ICD-10-CM | POA: Diagnosis not present

## 2024-04-22 DIAGNOSIS — H26491 Other secondary cataract, right eye: Secondary | ICD-10-CM | POA: Diagnosis not present

## 2024-04-22 DIAGNOSIS — H43813 Vitreous degeneration, bilateral: Secondary | ICD-10-CM | POA: Diagnosis not present

## 2024-05-25 ENCOUNTER — Other Ambulatory Visit: Payer: Self-pay | Admitting: Nurse Practitioner

## 2024-05-25 DIAGNOSIS — N3281 Overactive bladder: Secondary | ICD-10-CM

## 2024-09-21 ENCOUNTER — Ambulatory Visit: Admitting: Dermatology

## 2024-10-03 ENCOUNTER — Ambulatory Visit: Admitting: Dermatology

## 2024-10-11 ENCOUNTER — Ambulatory Visit: Admitting: Dermatology

## 2025-01-10 ENCOUNTER — Encounter: Admitting: Nurse Practitioner

## 2025-01-31 ENCOUNTER — Ambulatory Visit
